# Patient Record
Sex: Male | Born: 1945
Health system: Southern US, Community
[De-identification: ages and names within clinical notes are randomized; demographics above are authoritative.]

## PROBLEM LIST (undated history)

## (undated) DIAGNOSIS — I471 Supraventricular tachycardia, unspecified: Secondary | ICD-10-CM

## (undated) DIAGNOSIS — Z201 Contact with and (suspected) exposure to tuberculosis: Secondary | ICD-10-CM

## (undated) DIAGNOSIS — L509 Urticaria, unspecified: Secondary | ICD-10-CM

## (undated) DIAGNOSIS — M1990 Unspecified osteoarthritis, unspecified site: Secondary | ICD-10-CM

## (undated) DIAGNOSIS — Z9989 Dependence on other enabling machines and devices: Secondary | ICD-10-CM

## (undated) DIAGNOSIS — I2510 Atherosclerotic heart disease of native coronary artery without angina pectoris: Secondary | ICD-10-CM

## (undated) DIAGNOSIS — J189 Pneumonia, unspecified organism: Secondary | ICD-10-CM

## (undated) DIAGNOSIS — G4733 Obstructive sleep apnea (adult) (pediatric): Secondary | ICD-10-CM

## (undated) DIAGNOSIS — G8929 Other chronic pain: Secondary | ICD-10-CM

## (undated) DIAGNOSIS — I10 Essential (primary) hypertension: Secondary | ICD-10-CM

## (undated) DIAGNOSIS — N400 Enlarged prostate without lower urinary tract symptoms: Secondary | ICD-10-CM

## (undated) DIAGNOSIS — M545 Low back pain, unspecified: Secondary | ICD-10-CM

## (undated) DIAGNOSIS — I499 Cardiac arrhythmia, unspecified: Secondary | ICD-10-CM

## (undated) DIAGNOSIS — E119 Type 2 diabetes mellitus without complications: Secondary | ICD-10-CM

## (undated) DIAGNOSIS — J449 Chronic obstructive pulmonary disease, unspecified: Secondary | ICD-10-CM

## (undated) DIAGNOSIS — E785 Hyperlipidemia, unspecified: Secondary | ICD-10-CM

## (undated) HISTORY — DX: Atherosclerotic heart disease of native coronary artery without angina pectoris: I25.10

## (undated) HISTORY — PX: TRANSURETHRAL RESECTION OF PROSTATE: SHX73

## (undated) HISTORY — DX: Unspecified osteoarthritis, unspecified site: M19.90

## (undated) HISTORY — PX: JOINT REPLACEMENT: SHX530

## (undated) HISTORY — PX: CATARACT EXTRACTION W/ INTRAOCULAR LENS  IMPLANT, BILATERAL: SHX1307

## (undated) HISTORY — DX: Chronic obstructive pulmonary disease, unspecified: J44.9

## (undated) HISTORY — DX: Supraventricular tachycardia, unspecified: I47.10

## (undated) HISTORY — DX: Hyperlipidemia, unspecified: E78.5

## (undated) HISTORY — DX: Supraventricular tachycardia: I47.1

## (undated) HISTORY — DX: Benign prostatic hyperplasia without lower urinary tract symptoms: N40.0

## (undated) HISTORY — PX: BACK SURGERY: SHX140

## (undated) HISTORY — PX: ULNAR TUNNEL RELEASE: SHX820

## (undated) HISTORY — PX: BILATERAL CARPAL TUNNEL RELEASE: SHX6508

## (undated) HISTORY — DX: Essential (primary) hypertension: I10

## (undated) HISTORY — PX: CARDIAC CATHETERIZATION: SHX172

## (undated) HISTORY — PX: POSTERIOR LUMBAR FUSION: SHX6036

## (undated) HISTORY — DX: Urticaria, unspecified: L50.9

---
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
## 1987-11-04 HISTORY — PX: NASAL SINUS SURGERY: SHX719

## 1998-05-25 ENCOUNTER — Emergency Department (HOSPITAL_COMMUNITY): Admission: EM | Admit: 1998-05-25 | Discharge: 1998-05-25 | Payer: Self-pay | Admitting: Emergency Medicine

## 1998-05-28 ENCOUNTER — Observation Stay (HOSPITAL_COMMUNITY): Admission: RE | Admit: 1998-05-28 | Discharge: 1998-05-29 | Payer: Self-pay | Admitting: Urology

## 1998-06-15 ENCOUNTER — Emergency Department (HOSPITAL_COMMUNITY): Admission: EM | Admit: 1998-06-15 | Discharge: 1998-06-15 | Payer: Self-pay | Admitting: Emergency Medicine

## 1998-06-16 ENCOUNTER — Inpatient Hospital Stay (HOSPITAL_COMMUNITY): Admission: EM | Admit: 1998-06-16 | Discharge: 1998-06-18 | Payer: Self-pay

## 1998-07-17 ENCOUNTER — Ambulatory Visit (HOSPITAL_COMMUNITY): Admission: RE | Admit: 1998-07-17 | Discharge: 1998-07-17 | Payer: Self-pay | Admitting: Neurosurgery

## 1998-07-29 ENCOUNTER — Encounter: Payer: Self-pay | Admitting: Neurosurgery

## 1998-07-29 ENCOUNTER — Ambulatory Visit (HOSPITAL_COMMUNITY): Admission: RE | Admit: 1998-07-29 | Discharge: 1998-07-29 | Payer: Self-pay | Admitting: Neurosurgery

## 1999-03-09 ENCOUNTER — Encounter: Payer: Self-pay | Admitting: Neurosurgery

## 1999-03-09 ENCOUNTER — Ambulatory Visit (HOSPITAL_COMMUNITY): Admission: RE | Admit: 1999-03-09 | Discharge: 1999-03-09 | Payer: Self-pay

## 1999-03-13 ENCOUNTER — Encounter: Payer: Self-pay | Admitting: Neurosurgery

## 1999-03-13 ENCOUNTER — Inpatient Hospital Stay (HOSPITAL_COMMUNITY): Admission: RE | Admit: 1999-03-13 | Discharge: 1999-03-14 | Payer: Self-pay | Admitting: Neurosurgery

## 2000-08-29 ENCOUNTER — Ambulatory Visit (HOSPITAL_COMMUNITY): Admission: RE | Admit: 2000-08-29 | Discharge: 2000-08-29 | Payer: Self-pay | Admitting: Neurosurgery

## 2000-08-29 ENCOUNTER — Encounter: Payer: Self-pay | Admitting: Neurosurgery

## 2001-05-23 ENCOUNTER — Ambulatory Visit (HOSPITAL_COMMUNITY): Admission: RE | Admit: 2001-05-23 | Discharge: 2001-05-23 | Payer: Self-pay | Admitting: Neurosurgery

## 2001-05-23 ENCOUNTER — Encounter: Payer: Self-pay | Admitting: Neurosurgery

## 2001-08-12 ENCOUNTER — Encounter: Admission: RE | Admit: 2001-08-12 | Discharge: 2001-08-12 | Payer: Self-pay | Admitting: Neurosurgery

## 2001-08-12 ENCOUNTER — Encounter: Payer: Self-pay | Admitting: Neurosurgery

## 2004-02-12 ENCOUNTER — Emergency Department (HOSPITAL_COMMUNITY): Admission: EM | Admit: 2004-02-12 | Discharge: 2004-02-12 | Payer: Self-pay

## 2005-05-10 ENCOUNTER — Encounter: Admission: RE | Admit: 2005-05-10 | Discharge: 2005-05-10 | Payer: Self-pay | Admitting: Family Medicine

## 2005-11-03 HISTORY — PX: TOTAL KNEE ARTHROPLASTY: SHX125

## 2005-11-19 ENCOUNTER — Ambulatory Visit: Payer: Self-pay

## 2005-11-20 ENCOUNTER — Ambulatory Visit: Payer: Self-pay

## 2005-11-24 ENCOUNTER — Inpatient Hospital Stay (HOSPITAL_COMMUNITY): Admission: RE | Admit: 2005-11-24 | Discharge: 2005-11-28 | Payer: Self-pay | Admitting: Orthopedic Surgery

## 2005-11-24 ENCOUNTER — Ambulatory Visit: Payer: Self-pay | Admitting: Physical Medicine & Rehabilitation

## 2005-12-03 ENCOUNTER — Encounter: Admission: RE | Admit: 2005-12-03 | Discharge: 2005-12-03 | Payer: Self-pay | Admitting: Orthopedic Surgery

## 2005-12-30 ENCOUNTER — Encounter: Admission: RE | Admit: 2005-12-30 | Discharge: 2005-12-30 | Payer: Self-pay | Admitting: Family Medicine

## 2006-01-07 ENCOUNTER — Encounter: Admission: RE | Admit: 2006-01-07 | Discharge: 2006-01-07 | Payer: Self-pay | Admitting: Family Medicine

## 2006-01-18 ENCOUNTER — Encounter: Admission: RE | Admit: 2006-01-18 | Discharge: 2006-01-18 | Payer: Self-pay | Admitting: Neurology

## 2006-07-16 ENCOUNTER — Ambulatory Visit (HOSPITAL_COMMUNITY): Admission: RE | Admit: 2006-07-16 | Discharge: 2006-07-16 | Payer: Self-pay

## 2006-07-16 ENCOUNTER — Encounter: Payer: Self-pay | Admitting: Family Medicine

## 2007-09-06 ENCOUNTER — Encounter: Admission: RE | Admit: 2007-09-06 | Discharge: 2007-09-06 | Payer: Self-pay | Admitting: Neurosurgery

## 2008-08-28 ENCOUNTER — Encounter: Payer: Self-pay | Admitting: Family Medicine

## 2008-12-06 ENCOUNTER — Emergency Department (HOSPITAL_COMMUNITY): Admission: EM | Admit: 2008-12-06 | Discharge: 2008-12-06 | Payer: Self-pay | Admitting: Emergency Medicine

## 2009-03-22 ENCOUNTER — Encounter: Payer: Self-pay | Admitting: Family Medicine

## 2009-03-27 ENCOUNTER — Encounter: Payer: Self-pay | Admitting: Family Medicine

## 2009-11-29 ENCOUNTER — Ambulatory Visit: Payer: Self-pay | Admitting: Family Medicine

## 2009-11-29 DIAGNOSIS — E785 Hyperlipidemia, unspecified: Secondary | ICD-10-CM

## 2009-11-29 DIAGNOSIS — I10 Essential (primary) hypertension: Secondary | ICD-10-CM | POA: Insufficient documentation

## 2009-11-29 DIAGNOSIS — E119 Type 2 diabetes mellitus without complications: Secondary | ICD-10-CM

## 2009-11-29 DIAGNOSIS — R635 Abnormal weight gain: Secondary | ICD-10-CM

## 2009-11-29 HISTORY — DX: Hyperlipidemia, unspecified: E78.5

## 2009-11-29 HISTORY — DX: Essential (primary) hypertension: I10

## 2009-11-29 LAB — CONVERTED CEMR LAB
Bilirubin Urine: NEGATIVE
Blood in Urine, dipstick: NEGATIVE
Ketones, urine, test strip: NEGATIVE
Microalb Creat Ratio: 8.9 mg/g (ref 0.0–30.0)
Nitrite: NEGATIVE
Protein, U semiquant: NEGATIVE
Specific Gravity, Urine: <1.005
Urobilinogen, UA: 0.2
WBC Urine, dipstick: NEGATIVE

## 2009-11-30 LAB — CONVERTED CEMR LAB
Alkaline Phosphatase: 82 units/L (ref 39–117)
BUN: 6 mg/dL (ref 6–23)
CO2: 31 meq/L (ref 19–32)
Chloride: 101 meq/L (ref 96–112)
GFR calc non Af Amer: 103.17 mL/min (ref >60)
Glucose, Bld: 198 mg/dL — ABNORMAL HIGH (ref 70–99)
HDL: 39.3 mg/dL (ref >39.00)
Potassium: 4.1 meq/L (ref 3.5–5.1)
TSH: 0.58 microintl units/mL (ref 0.35–5.50)
Total Bilirubin: 0.6 mg/dL (ref 0.3–1.2)
Total CHOL/HDL Ratio: 3
Total Protein: 7.3 g/dL (ref 6.0–8.3)
VLDL: 19.2 mg/dL (ref 0.0–40.0)

## 2010-02-06 ENCOUNTER — Ambulatory Visit: Payer: Self-pay | Admitting: Family Medicine

## 2010-02-06 DIAGNOSIS — L509 Urticaria, unspecified: Secondary | ICD-10-CM

## 2010-02-06 HISTORY — DX: Urticaria, unspecified: L50.9

## 2010-02-11 ENCOUNTER — Telehealth: Payer: Self-pay | Admitting: Family Medicine

## 2010-02-18 ENCOUNTER — Encounter: Admission: RE | Admit: 2010-02-18 | Discharge: 2010-02-18 | Payer: Self-pay | Admitting: Endocrinology

## 2010-06-11 ENCOUNTER — Encounter: Admission: RE | Admit: 2010-06-11 | Discharge: 2010-08-02 | Payer: Self-pay | Admitting: Endocrinology

## 2010-06-12 ENCOUNTER — Ambulatory Visit: Payer: Self-pay | Admitting: Family Medicine

## 2010-06-14 LAB — CONVERTED CEMR LAB
AST: 27 units/L (ref 0–37)
BUN: 8 mg/dL (ref 6–23)
Calcium: 9.4 mg/dL (ref 8.4–10.5)
Chloride: 102 meq/L (ref 96–112)
Cholesterol: 112 mg/dL (ref 0–200)
Creatinine, Ser: 0.7 mg/dL (ref 0.4–1.5)
Hgb A1c MFr Bld: 7.3 % — ABNORMAL HIGH (ref 4.6–6.5)
LDL Cholesterol: 69 mg/dL (ref 0–99)
Potassium: 4.4 meq/L (ref 3.5–5.1)
Total CHOL/HDL Ratio: 4
Total Protein: 6.5 g/dL (ref 6.0–8.3)

## 2010-06-17 ENCOUNTER — Telehealth: Payer: Self-pay | Admitting: Family Medicine

## 2010-07-01 ENCOUNTER — Telehealth: Payer: Self-pay | Admitting: Family Medicine

## 2010-07-11 ENCOUNTER — Encounter: Payer: Self-pay | Admitting: Family Medicine

## 2010-09-11 ENCOUNTER — Ambulatory Visit: Payer: Self-pay | Admitting: Family Medicine

## 2010-09-11 LAB — CONVERTED CEMR LAB
Albumin: 3.7 g/dL (ref 3.5–5.2)
Alkaline Phosphatase: 80 units/L (ref 39–117)
BUN: 13 mg/dL (ref 6–23)
Basophils Absolute: 0.1 10*3/uL (ref 0.0–0.1)
Basophils Relative: 0.7 % (ref 0.0–3.0)
Bilirubin Urine: NEGATIVE
Chloride: 100 meq/L (ref 96–112)
Cholesterol: 128 mg/dL (ref 0–200)
Creatinine, Ser: 0.8 mg/dL (ref 0.4–1.5)
GFR calc non Af Amer: 105.97 mL/min (ref >60)
Glucose, Bld: 293 mg/dL — ABNORMAL HIGH (ref 70–99)
HCT: 47.4 % (ref 39.0–52.0)
HDL: 32.4 mg/dL — ABNORMAL LOW (ref >39.00)
Hemoglobin, Urine: NEGATIVE
Hemoglobin: 16 g/dL (ref 13.0–17.0)
LDL Cholesterol: 72 mg/dL (ref 0–99)
Lymphocytes Relative: 22.8 % (ref 12.0–46.0)
MCHC: 33.9 g/dL (ref 30.0–36.0)
MCV: 92.2 fL (ref 78.0–100.0)
Nitrite: NEGATIVE
Platelets: 231 10*3/uL (ref 150.0–400.0)
TSH: 0.83 microintl units/mL (ref 0.35–5.50)
Total Protein: 6.3 g/dL (ref 6.0–8.3)
Urine Glucose: >=1000 mg/dL
WBC: 10.8 10*3/uL — ABNORMAL HIGH (ref 4.5–10.5)

## 2010-09-23 ENCOUNTER — Ambulatory Visit: Payer: Self-pay | Admitting: Family Medicine

## 2010-09-23 LAB — CONVERTED CEMR LAB
Cholesterol, target level: 200 mg/dL
HDL goal, serum: 40 mg/dL

## 2010-11-24 ENCOUNTER — Encounter: Payer: Self-pay | Admitting: Family Medicine

## 2010-12-03 NOTE — Assessment & Plan Note (Signed)
Summary: med check/refill/pt coming in fasting/cjr   Vital Signs:  Patient profile:   65 year old male Weight:      270 pounds Temp:     98.5 degrees F oral Pulse rate:   88 / minute BP sitting:   120 / 60  (left arm) Cuff size:   large  Vitals Entered By: Romualdo Bolk, CMA (AAMA) (June 12, 2010 8:22 AM) CC: Pt is fasting for labs and a check up on meds, Hypertension Management   History of Present Illness: Followup type 2 diabetes and hyperlipidemia. Blood sugars recently well controlled. Fasting blood sugars mostly around 120. No hypoglycemia symptoms or hypoglycemic symptoms. Needs refills of medication.  No history of CAD. Patient specifically requests lipoprotein profile today. He has read up on this considerably. He takes medications as listed and these are reviewed. He is also seeing endocrinologist though prefers that we reassess A1c today. He gets regular eye exams and had recent foot exam.  Hypertension History:      He complains of side effects from treatment, but denies headache, chest pain, palpitations, dyspnea with exertion, orthopnea, PND, peripheral edema, visual symptoms, neurologic problems, and syncope.  He notes the following problems with antihypertensive medication side effects: Byetta-Discussing this with Dr. Horald Pollen in a few weeks. Pt has almost stopped eating.        Positive major cardiovascular risk factors include male age 5 years old or older, diabetes, hyperlipidemia, hypertension, and current tobacco user.      Preventive Screening-Counseling & Management  Alcohol-Tobacco     Alcohol drinks/day: <1     Alcohol type: spirits     Smoking Status: current     Packs/Day: 1.5     Year Started: 1963  Caffeine-Diet-Exercise     Caffeine use/day: 4     Does Patient Exercise: no  Current Medications (verified): 1)  Metformin Hcl 500 Mg Tabs (Metformin Hcl) .... 2 Two Times A Day 2)  Crestor 5 Mg Tabs (Rosuvastatin Calcium) .... Once Daily 3)   Diovan 160 Mg Tabs (Valsartan) .... Once Daily 4)  Byetta 10 Mcg Pen 10 Mcg/0.55ml Soln (Exenatide) .... As Directed 5)  Aspirin 81 Mg Tabs (Aspirin) .... Once Daily 6)  Epipen 2-Pak 0.3 Mg/0.71ml Devi (Epinephrine) .... As Needed 7)  Multivitamins   Tabs (Multiple Vitamin) .Marland Kitchen.. 1 By Mouth Two Times A Day  Allergies (verified): 1)  ! * Banana 2)  ! * Shrimp 3)  Codeine Sulfate (Codeine Sulfate) 4)  Darvocet A500 5)  Altace (Ramipril)  Past History:  Past Surgical History: Last updated: 11/29/2009 Appendectomy 1963 Spinal fusion (l-4, L-5) TKR 2007  Family History: Last updated: 11/29/2009 Family History of Arthritis Prostate cancer Diabetes  Social History: Last updated: 11/29/2009 Retired Married Current Smoker Alcohol use-yes Regular exercise-no  Risk Factors: Alcohol Use: <1 (06/12/2010) Caffeine Use: 4 (06/12/2010) Exercise: no (06/12/2010)  Risk Factors: Smoking Status: current (06/12/2010) Packs/Day: 1.5 (06/12/2010)  Past Medical History: OSTEOARTHRITIS DYSLIPIDEMIA TYPE 2 DIABETES PMH-FH-SH reviewed for relevance  Social History: Caffeine use/day:  4 Packs/Day:  1.5  Review of Systems  The patient denies anorexia, fever, weight loss, weight gain, chest pain, syncope, dyspnea on exertion, peripheral edema, headaches, abdominal pain, melena, and hematochezia.    Physical Exam  General:  Well-developed,well-nourished,in no acute distress; alert,appropriate and cooperative throughout examination Eyes:  pupils equal, pupils round, and pupils reactive to light.   Ears:  External ear exam shows no significant lesions or deformities.  Otoscopic examination reveals clear  canals, tympanic membranes are intact bilaterally without bulging, retraction, inflammation or discharge. Hearing is grossly normal bilaterally. Mouth:  Oral mucosa and oropharynx without lesions or exudates.  Teeth in good repair. Neck:  No deformities, masses, or tenderness  noted. Lungs:  Normal respiratory effort, chest expands symmetrically. Lungs are clear to auscultation, no crackles or wheezes. Heart:  Normal rate and regular rhythm. S1 and S2 normal without gallop, murmur, click, rub or other extra sounds. Extremities:  no edema or clubbing. Cervical Nodes:  No lymphadenopathy noted Psych:  normally interactive, good eye contact, not anxious appearing, and not depressed appearing.    Diabetes Management Exam:    Foot Exam (with socks and/or shoes not present):       Sensory-Pinprick/Light touch:          Left medial foot (L-4): normal          Left dorsal foot (L-5): normal          Left lateral foot (S-1): normal          Right medial foot (L-4): normal          Right dorsal foot (L-5): normal          Right lateral foot (S-1): normal       Sensory-Monofilament:          Left foot: normal          Right foot: normal       Inspection:          Left foot: normal          Right foot: normal       Nails:          Left foot: normal          Right foot: normal    Eye Exam:       Eye Exam done elsewhere          Date: 05/03/2010          Results: normal          Done by: Dr. Mabeline Caras   Impression & Recommendations:  Problem # 1:  DYSLIPIDEMIA (ICD-272.4) pt requesting lipoprotein profile.  Discussed issues regarding this.  He is aware insurance may not cover. His updated medication list for this problem includes:    Crestor 5 Mg Tabs (Rosuvastatin calcium) ..... Once daily  Orders: T- * Misc. Laboratory test [phone]) TLB-Lipid Panel (80061-LIPID) TLB-Hepatic/Liver Function Pnl (80076-HEPATIC)  Problem # 2:  DM (ICD-250.00)  His updated medication list for this problem includes:    Metformin Hcl 500 Mg Tabs (Metformin hcl) .Marland Kitchen... 2 two times a day    Diovan 160 Mg Tabs (Valsartan) ..... Once daily    Byetta 10 Mcg Pen 10 Mcg/0.23ml Soln (Exenatide) .Marland Kitchen... As directed    Aspirin 81 Mg Tabs (Aspirin) ..... Once daily  Orders: TLB-A1C / Hgb  A1C (Glycohemoglobin) (83036-A1C)  Complete Medication List: 1)  Metformin Hcl 500 Mg Tabs (Metformin hcl) .... 2 two times a day 2)  Crestor 5 Mg Tabs (Rosuvastatin calcium) .... Once daily 3)  Diovan 160 Mg Tabs (Valsartan) .... Once daily 4)  Byetta 10 Mcg Pen 10 Mcg/0.26ml Soln (Exenatide) .... As directed 5)  Aspirin 81 Mg Tabs (Aspirin) .... Once daily 6)  Epipen 2-pak 0.3 Mg/0.76ml Devi (Epinephrine) .... As needed 7)  Multivitamins Tabs (Multiple vitamin) .Marland Kitchen.. 1 by mouth two times a day  Other Orders: TLB-BMP (Basic Metabolic Panel-BMET) (80048-METABOL) Prescription Created Electronically [phone])  Hypertension Assessment/Plan:  The patient's hypertensive risk group is category C: Target organ damage and/or diabetes.  His calculated 10 year risk of coronary heart disease is 22 %.  Today's blood pressure is 120/60.    Patient Instructions: 1)  Please schedule a follow-up appointment in 6 months .  2)  It is important that you exercise reguarly at least 20 minutes 5 times a week. If you develop chest pain, have severe difficulty breathing, or feel very tired, stop exercising immediately and seek medical attention.  3)  You need to lose weight. Consider a lower calorie diet and regular exercise.  4)  Check your blood sugars regularly. If your readings are usually above: 140 or below 70 you should contact our office.  5)  It is important that your diabetic A1c level is checked every 3 months.  6)  See your eye doctor yearly to check for diabetic eye damage. 7)  Check your feet each night  for sore areas, calluses or signs of infection.  Prescriptions: DIOVAN 160 MG TABS (VALSARTAN) once daily  #90 x 3   Entered and Authorized by:   Evelena Peat MD   Signed by:   Evelena Peat MD on 06/12/2010   Method used:   Print then Give to Patient   RxID:   [id number] CRESTOR 5 MG TABS (ROSUVASTATIN CALCIUM) once daily  #90 x 3   Entered and Authorized by:   Evelena Peat MD    Signed by:   Evelena Peat MD on 06/12/2010   Method used:   Print then Give to Patient   RxID:   [id number]

## 2010-12-03 NOTE — Letter (Signed)
Summary: [facility]  [facility]   Imported By: Maryln Gottron 07/30/2010 10:47:50  _____________________________________________________________________  External Attachment:    Type:   Image     Comment:   External Document

## 2010-12-03 NOTE — Progress Notes (Signed)
Summary: Symptoms no better, allergy referral request  Phone Note Call from Patient Call back at Work Phone [phone]   Caller: Patient Call For: Steve Peat MD Summary of Call: VM from pt reporting "I'm no better, I would like to be seen by the allergy office in [place],  please arrange and call me back". Phone [phone] Initial call taken by: Sid Falcon LPN,  February 11, 2010 9:20 AM  Follow-up for Phone Call        OK to refer to Dr Stout Callas (allergist) Follow-up by: Steve Peat MD,  February 11, 2010 1:04 PM  Additional Follow-up for Phone Call Additional follow up Details #1::        Pt informed of referral, pt requesting asap Additional Follow-up by: Sid Falcon LPN,  February 11, 2010 4:22 PM

## 2010-12-03 NOTE — Assessment & Plan Note (Signed)
Summary: cpx/cjr   Vital Signs:  Patient profile:   65 year old male Height:      67 inches Weight:      277 pounds BMI:     43.54 Temp:     98.1 degrees F oral Pulse rate:   80 / minute Pulse rhythm:   regular Resp:     12 per minute BP sitting:   122 / 78  (left arm) Cuff size:   large  Vitals Entered By: Sid Falcon LPN (September 23, 2010 2:46 PM)  Nutrition Counseling: Patient's BMI is greater than 25 and therefore counseled on weight management options.  History of Present Illness: Pt here for medicare wellness exam and medical problem follow up.  Has Type 2 diabetes, hypertension, and hyperlipidemia.  Here for Medicare AWV:  1.   Risk factors based on Past M, S, F history:  PMH signif for hypertension, Type 2 diabetes, dyslipidiemia.  Ongoing cigarette use.   FH signif for father with Prostate Ca.  Pt has had prior TURP 2.   Physical Activities: Walks some for exercise but no formal exercise. 3.   Depression/mood: No depressive disorder or evidence for any anxiety d/o at this time. 4.   Hearing: No signif impairments. 5.   ADL's: Fully  indepedent in all. 6.   Fall Risk: Low .  No recent falls. 7.   Home Safety: No issues identified. 8.   Height, weight, &visual acuity:  Weight has increased slightly c/w last year. 9.   Counseling: needs to lose some weight.  Immunizations need updating. 10.   Labs ordered based on risk factors: lipids, hepatic, BMP, PSA. 11.           Referral Coordination  He continues to see endocrinologist but does need appt at this time. 12.           Care Plan  Stop smoking.  Lose weight.  Exercise more.  Immunizations with Flu, PVX, and TDap. 13.            Cognitive Assessment  No impairments to short or long term memory.  Reasoning skills are solid.   Diabetes Management History:      He has not been enrolled in the "Diabetic Education Program".  He states understanding of dietary principles but he is not following the appropriate diet.  No  sensory loss is reported.  Self foot exams are being performed.  He is checking home blood sugars.  He says that he is not exercising regularly.        Hypoglycemic symptoms are not occurring.  No hyperglycemic symptoms are reported.    Hypertension History:      He denies headache, chest pain, palpitations, dyspnea with exertion, orthopnea, PND, peripheral edema, visual symptoms, neurologic problems, syncope, and side effects from treatment.        Positive major cardiovascular risk factors include male age 39 years old or older, diabetes, hyperlipidemia, hypertension, and current tobacco user.        Further assessment for target organ damage reveals no history of ASHD, stroke/TIA, or peripheral vascular disease.    Lipid Management History:      Positive NCEP/ATP III risk factors include male age 54 years old or older, diabetes, HDL cholesterol less than 40, current tobacco user, and hypertension.  Negative NCEP/ATP III risk factors include no ASHD (atherosclerotic heart disease), no prior stroke/TIA, no peripheral vascular disease, and no history of aortic aneurysm.      Clinical Review  Panels:  Prevention   Last Colonoscopy:  normal (11/03/2008)   Last PSA:  0.94 (09/11/2010)   Allergies: 1)  ! * Banana 2)  ! * Shrimp 3)  Codeine Sulfate (Codeine Sulfate) 4)  Darvocet A500 5)  Altace (Ramipril)  Past History:  Past Medical History: Last updated: 06/12/2010 OSTEOARTHRITIS DYSLIPIDEMIA TYPE 2 DIABETES  Family History: Last updated: 11/29/2009 Family History of Arthritis Prostate cancer Diabetes  Social History: Last updated: 11/29/2009 Retired Married Current Smoker Alcohol use-yes Regular exercise-no  Risk Factors: Alcohol Use: <1 (06/12/2010) Caffeine Use: 4 (06/12/2010) Exercise: no (06/12/2010)  Risk Factors: Smoking Status: current (06/12/2010) Packs/Day: 1.5 (06/12/2010)  Past Surgical History: Appendectomy 1963 Spinal fusion (l-4, L-5) TKR  2007 Hx TURP PMH-FH-SH reviewed for relevance  Review of Systems       The patient complains of weight gain.  The patient denies anorexia, fever, weight loss, vision loss, decreased hearing, hoarseness, chest pain, syncope, dyspnea on exertion, peripheral edema, prolonged cough, headaches, hemoptysis, abdominal pain, melena, hematochezia, severe indigestion/heartburn, hematuria, incontinence, muscle weakness, suspicious skin lesions, transient blindness, difficulty walking, depression, and enlarged lymph nodes.    Physical Exam  General:  Well-developed,well-nourished,in no acute distress; alert,appropriate and cooperative throughout examination Head:  Normocephalic and atraumatic without obvious abnormalities. No apparent alopecia or balding. Eyes:  pupils equal, pupils round, and pupils reactive to light.   Ears:  External ear exam shows no significant lesions or deformities.  Otoscopic examination reveals clear canals, tympanic membranes are intact bilaterally without bulging, retraction, inflammation or discharge. Hearing is grossly normal bilaterally. Mouth:  Oral mucosa and oropharynx without lesions or exudates.  Teeth in good repair. Neck:  No deformities, masses, or tenderness noted. Chest Wall:  No deformities, masses, tenderness or gynecomastia noted. Lungs:  Normal respiratory effort, chest expands symmetrically. Lungs are clear to auscultation, no crackles or wheezes. Heart:  normal rate and regular rhythm.   Abdomen:  soft, non-tender, and normal bowel sounds.   Rectal:  colonoscopy last year. Prostate:  Hx of TURP. Msk:  No deformity or scoliosis noted of thoracic or lumbar spine.   Extremities:  no pitting edema.   Neurologic:  alert & oriented X3 and cranial nerves II-XII intact.   Skin:  Intact without suspicious lesions or rashes Cervical Nodes:  No lymphadenopathy noted Psych:  normally interactive, good eye contact, not anxious appearing, and not depressed appearing.     Diabetes Management Exam:    Foot Exam (with socks and/or shoes not present):       Sensory-Pinprick/Light touch:          Left medial foot (L-4): normal          Left dorsal foot (L-5): normal          Left lateral foot (S-1): normal          Right medial foot (L-4): normal          Right dorsal foot (L-5): normal          Right lateral foot (S-1): normal       Sensory-Monofilament:          Left foot: normal          Right foot: normal       Inspection:          Left foot: normal          Right foot: normal       Nails:          Left foot: normal  Right foot: normal   Impression & Recommendations:  Problem # 1:  Preventive Health Care (ICD-V70.0) Flu, PVX, and Tdap recommended and pt consented to all.  Problem # 2:  HYPERTENSION, ESSENTIAL (ICD-401.9)  His updated medication list for this problem includes:    Diovan 160 Mg Tabs (Valsartan) ..... Once daily  Problem # 3:  DYSLIPIDEMIA (ICD-272.4)  His updated medication list for this problem includes:    Crestor 5 Mg Tabs (Rosuvastatin calcium) ..... Once daily  Problem # 4:  DM (ICD-250.00)  The following medications were removed from the medication list:    Byetta 10 Mcg Pen 10 Mcg/0.12ml Soln (Exenatide) .Marland Kitchen... As directed His updated medication list for this problem includes:    Metformin Hcl 500 Mg Tabs (Metformin hcl) .Marland Kitchen... 2 two times a day    Diovan 160 Mg Tabs (Valsartan) ..... Once daily    Aspirin 81 Mg Tabs (Aspirin) ..... Once daily  Complete Medication List: 1)  Metformin Hcl 500 Mg Tabs (Metformin hcl) .... 2 two times a day 2)  Crestor 5 Mg Tabs (Rosuvastatin calcium) .... Once daily 3)  Diovan 160 Mg Tabs (Valsartan) .... Once daily 4)  Aspirin 81 Mg Tabs (Aspirin) .... Once daily 5)  Epipen 2-pak 0.3 Mg/0.51ml Devi (Epinephrine) .... As needed 6)  Multivitamins Tabs (Multiple vitamin) .Marland Kitchen.. 1 by mouth two times a day  Other Orders: Flu Vaccine 41yrs + MEDICARE PATIENTS  (B2841) Administration Flu vaccine - MCR (G0008) Tdap => 58yrs IM (32440) Pneumococcal Vaccine (10272) Admin 1st Vaccine (53664) Admin of Any Addtl Vaccine (40347) Medicare -1st Annual Wellness Visit [phone])  Diabetes Management Assessment/Plan:      The following lipid goals have been established for the patient: Total cholesterol goal of 200; LDL cholesterol goal of 100; HDL cholesterol goal of 40; Triglyceride goal of 150.  His blood pressure goal is < 130/80.    Hypertension Assessment/Plan:      The patient's hypertensive risk group is category C: Target organ damage and/or diabetes.  His calculated 10 year risk of coronary heart disease is 27 %.  Today's blood pressure is 122/78.  His blood pressure goal is < 130/80.  Lipid Assessment/Plan:      Based on NCEP/ATP III, the patient's risk factor category is "history of diabetes".  The patient's lipid goals are as follows: Total cholesterol goal is 200; LDL cholesterol goal is 100; HDL cholesterol goal is 40; Triglyceride goal is 150.    Patient Instructions: 1)  Please schedule a follow-up appointment in 6 months .  2)  It is important that you exercise reguarly at least 20 minutes 5 times a week. If you develop chest pain, have severe difficulty breathing, or feel very tired, stop exercising immediately and seek medical attention.  3)  You need to lose weight. Consider a lower calorie diet and regular exercise.    Orders Added: 1)  Flu Vaccine 59yrs + MEDICARE PATIENTS [Q2039] 2)  Administration Flu vaccine - MCR [G0008] 3)  Tdap => 56yrs IM [90715] 4)  Pneumococcal Vaccine [90732] 5)  Admin 1st Vaccine [90471] 6)  Admin of Any Addtl Vaccine [90472] 7)  Medicare -1st Annual Wellness Visit [G0438] 8)  Est. Patient Level III [63875]   Immunizations Administered:  Tetanus Vaccine:    Vaccine Type: Tdap    Site: left deltoid    Mfr: GlaxoSmithKline    Dose: 0.5 ml    Route: IM    Given by: Sid Falcon LPN    Exp.  Date:  08/22/2012    Lot #: [id number]  Pneumonia Vaccine:    Vaccine Type: Pneumovax    Site: right deltoid    Mfr: Merck    Dose: 0.5 ml    Route: IM    Given by: Sid Falcon LPN    Exp. Date: 02/28/2012    Lot #: [id number]   Immunizations Administered:  Tetanus Vaccine:    Vaccine Type: Tdap    Site: left deltoid    Mfr: GlaxoSmithKline    Dose: 0.5 ml    Route: IM    Given by: Sid Falcon LPN    Exp. Date: 08/22/2012    Lot #: [id number]  Pneumonia Vaccine:    Vaccine Type: Pneumovax    Site: right deltoid    Mfr: Merck    Dose: 0.5 ml    Route: IM    Given by: Sid Falcon LPN    Exp. Date: 02/28/2012    Lot #: [id number]  Flu Vaccine Consent Questions     Do you have a history of severe allergic reactions to this vaccine? no    Any prior history of allergic reactions to egg and/or gelatin? no    Do you have a sensitivity to the preservative Thimersol? no    Do you have a past history of Guillan-Barre Syndrome? no    Do you currently have an acute febrile illness? no    Have you ever had a severe reaction to latex? no    Vaccine information given and explained to patient? yes    Are you currently pregnant? no    Lot Number:[id number]   Exp Date:05/03/2011   Site Given  Left Deltoid IM         .lbmedflu

## 2010-12-03 NOTE — Progress Notes (Signed)
Summary: Crestor, Diovan to [facility] request  Phone Note Call from Patient   Caller: Patient Call For: Steve Peat MD Summary of Call: Pt called requestinf Crestor and Diovan be sent to [facility] Initial call taken by: Sid Falcon LPN,  July 01, 2010 12:22 PM    Prescriptions: DIOVAN 160 MG TABS (VALSARTAN) once daily  #90 x 3   Entered by:   Sid Falcon LPN   Authorized by:   Steve Peat MD   Signed by:   Sid Falcon LPN on 16/08/9603   Method used:   Faxed to ...       [facility] (mail-order)             , [place]         Ph: [phone]       Fax: [phone]   RxID:   [id number] CRESTOR 5 MG TABS (ROSUVASTATIN CALCIUM) once daily  #90 x 3   Entered by:   Sid Falcon LPN   Authorized by:   Steve Peat MD   Signed by:   Sid Falcon LPN on 32/44/0102   Method used:   Faxed to ...       [facility] (mail-order)             , [place]         Ph: [phone]       Fax: [phone]   RxID:   [id number]

## 2010-12-03 NOTE — Assessment & Plan Note (Signed)
Summary: ?Steve Charles   Vital Signs:  Patient profile:   65 year old male Weight:      268 pounds Temp:     98.3 degrees F oral BP sitting:   98 / 68  (left arm) Cuff size:   large  Vitals Entered By: Sid Falcon LPN (February 06, 1913 2:57 PM) CC: itiching all over, hives   History of Present Illness: Acute visit for hives. Onset yesterday after eating cranberries. No prior history of cranberry allergy but he thinks this may have been the culprit. Prior history of allergy to bananas and shrimp. Denies any angioedema, shortness of breath, or wheezing. Benadryl with mild relief. Also needs EpiPen refill.  Allergies: 1)  ! * Banana 2)  ! * Shrimp 3)  Codeine Sulfate (Codeine Sulfate) 4)  Darvocet A500 5)  Altace (Ramipril)  Review of Systems  The patient denies fever, hoarseness, chest pain, syncope, dyspnea on exertion, peripheral edema, and headaches.    Physical Exam  General:  Well-developed,well-nourished,in no acute distress; alert,appropriate and cooperative throughout examination Head:  Normocephalic and atraumatic without obvious abnormalities. No apparent alopecia or balding. Mouth:  Oral mucosa and oropharynx without lesions or exudates.  Teeth in good repair. Lungs:  Normal respiratory effort, chest expands symmetrically. Lungs are clear to auscultation, no crackles or wheezes. Heart:  Normal rate and regular rhythm. S1 and S2 normal without gallop, murmur, click, rub or other extra sounds. Skin:  few minimally raised urticarial lesions on the trunk and upper extremities   Impression & Recommendations:  Problem # 1:  URTICARIA (ICD-708.9) ? food allergy to cranberries.  samples of Clarinex given. Add H2 blocker and EpiPen prescription given.  Complete Medication List: 1)  Metformin Hcl 1000 Mg Tabs (Metformin hcl) .... One tab two times a day 2)  Crestor 5 Mg Tabs (Rosuvastatin calcium) .... Once daily 3)  Diovan 160 Mg Tabs (Valsartan) .... Once daily 4)   Byetta 10 Mcg Pen 10 Mcg/0.20ml Soln (Exenatide) .... As directed 5)  Aspirin 81 Mg Tabs (Aspirin) .... Once daily 6)  Epipen 2-pak 0.3 Mg/0.61ml Devi (Epinephrine) .... As needed  Patient Instructions: 1)  Start Clarinex 5 mg one tablet daily 2)  Consider adding Zantac or Pepcid twice daily until rash clears Prescriptions: EPIPEN 2-PAK 0.3 MG/0.3ML DEVI (EPINEPHRINE) as needed  #1 x 1   Entered and Authorized by:   Evelena Peat MD   Signed by:   Evelena Peat MD on 02/06/2010   Method used:   Electronically to        [facility]* (retail)       [address]       [place], [place]  [address]       Ph: [phone] or [phone]       Fax: [phone]   RxID:   [id number]

## 2010-12-03 NOTE — Progress Notes (Signed)
Summary: Waiver of Liability Form needs to be signed for labs  Phone Note Outgoing Call Call back at [facility] Phone [phone]   Call placed by: Sid Falcon LPN,  June 17, 2010 1:25 PM Call placed to: Patient Summary of Call: Message left for pt re: special lab work pt had requested, NMR LipoProfile with lipids.  This will not be covered by Medicare and we need to have him sign a "Waiver of Liability Form".  This is the 3rd message left fot pt.  His blood drawn wil be good only through 8/17 per Gwenn in lab Initial call taken by: Sid Falcon LPN,  June 17, 2010 1:30 PM  Follow-up for Phone Call        Pt called to adv that he is out of town and won't return till 8/20... Pt adv that he will have to come back in for labdraw / signature for waiver for testing once he returns.  Follow-up by: Debbra Riding,  June 17, 2010 2:07 PM  Additional Follow-up for Phone Call Additional follow up Details #1::        Lab [facility] informed Additional Follow-up by: Sid Falcon LPN,  June 18, 2010 10:27 AM

## 2010-12-03 NOTE — Assessment & Plan Note (Signed)
Summary: TO BE EST/[place]/PT WILL COME IN FASTING/NJR   Vital Signs:  Patient profile:   65 year old male Height:      66.50 inches Weight:      273 pounds BMI:     43.56 Temp:     99.0 degrees F oral Pulse rate:   88 / minute Pulse rhythm:   regular Resp:     12 per minute BP sitting:   120 / 80  (left arm) Cuff size:   large  Vitals Entered By: Sid Falcon LPN (November 29, 2009 10:29 AM)  Nutrition Counseling: Patient's BMI is greater than 25 and therefore counseled on weight management options. CC: New to establish from [facility], discuss meds, referral   History of Present Illness: New patient to establish care.  Patient has type 2 diabetes, hypertension, and osteoarthritis. Also history of hyperlipidemia. Current medications reviewed include metformin 1000 mg b.i.d., Crestor 5 mg daily, Diovan 160 mg daily, and Byetta 10 micrograms twice daily.  Last A1c per patient about 6 months ago and 7.2%. Occasional blood sugars in the 50s range. Does not check blood sugars frequently. No recent increased thirst or urine frequency. No weight changes.  Diabetes Management History:      He has not been enrolled in the "Diabetic Education Program".  He states understanding of dietary principles and is following his diet appropriately.  No sensory loss is reported.  Self foot exams are being performed.  He is checking home blood sugars.  He says that he is not exercising regularly.        Hypoglycemic symptoms are not occurring.  No hyperglycemic symptoms are reported.        There are no symptoms to suggest diabetic complications.  Other questions/concerns include: recent weight gain.  Suboptimal A1C when checked 6 months ago.  Since his last visit, no infections have occurred.    Preventive Screening-Counseling & Management  Alcohol-Tobacco     Smoking Status: current     Year Started: 1963  Caffeine-Diet-Exercise     Does Patient Exercise: no  Allergies (verified): 1)   Codeine Sulfate (Codeine Sulfate) 2)  Darvocet A500 (Propoxyphene N-Apap) 3)  Altace (Ramipril)  Past History:  Past Medical History: ARTHRITIS dIABETES  Past Surgical History: Appendectomy 1963 Spinal fusion (l-4, L-5) TKR 2007  Family History: Family History of Arthritis Prostate cancer Diabetes  Social History: Retired Married Current Smoker Alcohol use-yes Regular exercise-no Smoking Status:  current Does Patient Exercise:  no  Review of Systems       The patient complains of weight gain.  The patient denies anorexia, fever, weight loss, vision loss, chest pain, syncope, dyspnea on exertion, peripheral edema, prolonged cough, headaches, abdominal pain, melena, hematochezia, incontinence, and muscle weakness.    Physical Exam  General:  Alert, pleasant, obese. Head:  Normocephalic and atraumatic without obvious abnormalities.  Diabetes Management Exam:    Foot Exam (with socks and/or shoes not present):       Sensory-Pinprick/Light touch:          Left medial foot (L-4): normal          Left dorsal foot (L-5): normal          Left lateral foot (S-1): normal          Right medial foot (L-4): normal          Right dorsal foot (L-5): normal          Right lateral foot (S-1): normal  Sensory-Monofilament:          Left foot: normal          Right foot: normal       Inspection:          Left foot: normal          Right foot: normal       Nails:          Left foot: normal          Right foot: normal    Eye Exam:       Eye Exam done elsewhere          Date: 12/2008          Results: normal          Done by: ophthalmologist   Impression & Recommendations:  Problem # 1:  DM (ICD-250.00) Suboptimal control.  Pt requests endocrine referral.  Not interested in staying on Byetta. His updated medication list for this problem includes:    Metformin Hcl 1000 Mg Tabs (Metformin hcl) ..... One tab two times a day    Diovan 160 Mg Tabs (Valsartan) ..... Once  daily    Byetta 10 Mcg Pen 10 Mcg/0.56ml Soln (Exenatide) .Marland Kitchen... As directed    Aspirin 81 Mg Tabs (Aspirin) ..... Once daily  Orders: Endocrinology Referral (Endocrine) TLB-A1C / Hgb A1C (Glycohemoglobin) (83036-A1C) TLB-BMP (Basic Metabolic Panel-BMET) (80048-METABOL) TLB-Microalbumin/Creat Ratio, Urine (82043-MALB)  Problem # 2:  DYSLIPIDEMIA (ICD-272.4) recheck lipids His updated medication list for this problem includes:    Crestor 5 Mg Tabs (Rosuvastatin calcium) ..... Once daily  Orders: TLB-Lipid Panel (80061-LIPID) TLB-Hepatic/Liver Function Pnl (80076-HEPATIC)  Problem # 3:  WEIGHT GAIN (ICD-783.1) check labs including TSH. Orders: TLB-TSH (Thyroid Stimulating Hormone) (84443-TSH)  Problem # 4:  HYPERTENSION, ESSENTIAL (ICD-401.9) controlled. His updated medication list for this problem includes:    Diovan 160 Mg Tabs (Valsartan) ..... Once daily  Complete Medication List: 1)  Metformin Hcl 1000 Mg Tabs (Metformin hcl) .... One tab two times a day 2)  Crestor 5 Mg Tabs (Rosuvastatin calcium) .... Once daily 3)  Diovan 160 Mg Tabs (Valsartan) .... Once daily 4)  Byetta 10 Mcg Pen 10 Mcg/0.10ml Soln (Exenatide) .... As directed 5)  Aspirin 81 Mg Tabs (Aspirin) .... Once daily  Patient Instructions: 1)  It is important that you exercise reguarly at least 20 minutes 5 times a week. If you develop chest pain, have severe difficulty breathing, or feel very tired, stop exercising immediately and seek medical attention.  2)  You need to lose weight. Consider a lower calorie diet and regular exercise.  3)  Check your blood sugars regularly. If your readings are usually above: 140 or below 70 you should contact our office.  4)  It is important that your diabetic A1c level is checked every 3 months.  5)  See your eye doctor yearly to check for diabetic eye damage. 6)  Check your feet each night  for sore areas, calluses or signs of infection.  Prescriptions: BYETTA 10 MCG  PEN 10 MCG/0.04ML SOLN (EXENATIDE) as directed  #1 x 5   Entered and Authorized by:   Evelena Peat MD   Signed by:   Evelena Peat MD on 11/29/2009   Method used:   Electronically to        [facility]* (retail)       [address]       [place], [place]  [address]  Ph: [phone] or [phone]       Fax: [phone]   RxID:   [id number] DIOVAN 160 MG TABS (VALSARTAN) once daily  #30 x 5   Entered and Authorized by:   Evelena Peat MD   Signed by:   Evelena Peat MD on 11/29/2009   Method used:   Electronically to        [facility]* (retail)       [address] [place], [place]  [address]       Ph: [phone] or [phone]       Fax: [phone]   RxID:   [id number] CRESTOR 5 MG TABS (ROSUVASTATIN CALCIUM) once daily  #30 x 5   Entered and Authorized by:   Evelena Peat MD   Signed by:   Evelena Peat MD on 11/29/2009   Method used:   Electronically to        [facility]* (retail)       [address] [place], [place]  [address]       Ph: [phone] or [phone]       Fax: [phone]   RxID:   [id number] METFORMIN HCL 1000 MG TABS (METFORMIN HCL) one tab two times a day  #60 x 5   Entered and Authorized by:   Evelena Peat MD   Signed by:   Evelena Peat MD on 11/29/2009   Method used:   Electronically to        [facility]* (retail)       [address] [place], [place]  [address]       Ph: [phone] or [phone]       Fax: [phone]   RxID:   [id number]   Preventive Care Screening  Colonoscopy:    Date:  11/03/2008    Results:  normal    Laboratory Results   Urine Tests    Routine Urinalysis   Color: yellow Appearance: Clear Glucose: trace   (Normal Range: Negative) Bilirubin: negative   (Normal Range: Negative) Ketone: negative   (Normal Range: Negative) Spec. Gravity: <1.005   (Normal Range: 1.003-1.035) Blood: negative   (Normal Range:  Negative) pH: 6.5   (Normal Range: 5.0-8.0) Protein: negative   (Normal Range: Negative) Urobilinogen: 0.2   (Normal Range: 0-1) Nitrite: negative   (Normal Range: Negative) Leukocyte Esterace: negative   (Normal Range: Negative)    Comments: Rita Ohara  November 29, 2009 1:08 PM

## 2010-12-18 ENCOUNTER — Encounter: Payer: Self-pay | Admitting: Family Medicine

## 2010-12-18 ENCOUNTER — Ambulatory Visit (INDEPENDENT_AMBULATORY_CARE_PROVIDER_SITE_OTHER): Payer: Medicare Other | Admitting: Family Medicine

## 2010-12-18 VITALS — BP 122/82 | HR 80 | Temp 97.8°F | Resp 12 | Ht 67.25 in | Wt 288.0 lb

## 2010-12-18 DIAGNOSIS — I10 Essential (primary) hypertension: Secondary | ICD-10-CM

## 2010-12-18 DIAGNOSIS — R002 Palpitations: Secondary | ICD-10-CM

## 2010-12-18 DIAGNOSIS — I499 Cardiac arrhythmia, unspecified: Secondary | ICD-10-CM

## 2010-12-18 DIAGNOSIS — E119 Type 2 diabetes mellitus without complications: Secondary | ICD-10-CM

## 2010-12-18 NOTE — Patient Instructions (Signed)
We will call you regarding cardiology appointment. Follow up promptly for any chest pain, shortness of breath, or any progressive dizziness.

## 2010-12-18 NOTE — Progress Notes (Signed)
  Subjective:    Patient ID: Steve Charles, male    DOB: 11-11-1945, 65 y.o.   MRN: [id number]  HPI  Patient is seen with sensation of irregular heartbeat past few days. Symptoms usually very transient. No associated chest pain. Sense of irregularity with mild tachycardia with reported pulse of 110 on one occasion. Denies any shortness of breath or fatigue. Patient states pulse irregular with similar episode a few years ago. No history of CAD. Does have history of peptic diabetes, dyslipidemia, and hypertension. He experienced dizziness with episode but no syncope. Recent TSH normal.  Chronic problems include obesity, hyperlipidemia, Type 2 diabetes, hypertension.  Patient quit smoking about 15 days ago.   Review of Systems  Constitutional: Negative for fever, activity change, appetite change and fatigue.  Respiratory: Negative for cough, shortness of breath and wheezing.   Cardiovascular: Positive for palpitations. Negative for chest pain and leg swelling.  Gastrointestinal: Negative for abdominal pain.  Genitourinary: Negative for dysuria.  Neurological: Negative for syncope.  Psychiatric/Behavioral: Negative for dysphoric mood.   Past Medical History  Diagnosis Date  . DM 11/29/2009  . DYSLIPIDEMIA 11/29/2009  . HYPERTENSION, ESSENTIAL 11/29/2009  . URTICARIA 02/06/2010  . WEIGHT GAIN 11/29/2009   Past Surgical History  Procedure Date  . Appendectomy 1963  . Spine surgery     spinal fusion - L4, L5  . Total knee arthroplasty 2007  . Transurethral resection of prostate     reports that he quit smoking about 2 weeks ago. His smoking use included Cigarettes. He has a 75 pack-year smoking history. He does not have any smokeless tobacco history on file. He reports that he drinks alcohol. His drug history not on file. family history is negative for Arthritis, and Cancer, and Diabetes, .        Objective:   Physical Exam  patient is alert and in no distress  neck no mass Chest  clear to auscultation Heart occasional premature beat with rate at 80. No murmur Extremities no edema.       Assessment & Plan:  #1 Palpitations.  ?transient atrial fibrillation.  Discussed options including Holter monitor vs cardiology referral and he prefers the later. EKG shows NSR with no acute findings. #2 Type 2 diabetes with hx poor control.

## 2010-12-31 DIAGNOSIS — M1990 Unspecified osteoarthritis, unspecified site: Secondary | ICD-10-CM | POA: Insufficient documentation

## 2010-12-31 DIAGNOSIS — N400 Enlarged prostate without lower urinary tract symptoms: Secondary | ICD-10-CM | POA: Insufficient documentation

## 2011-01-01 ENCOUNTER — Other Ambulatory Visit: Payer: Self-pay | Admitting: Cardiology

## 2011-01-01 ENCOUNTER — Encounter: Payer: Self-pay | Admitting: Cardiology

## 2011-01-01 ENCOUNTER — Ambulatory Visit (INDEPENDENT_AMBULATORY_CARE_PROVIDER_SITE_OTHER): Payer: Medicare Other | Admitting: Cardiology

## 2011-01-01 DIAGNOSIS — R002 Palpitations: Secondary | ICD-10-CM | POA: Insufficient documentation

## 2011-01-01 DIAGNOSIS — I10 Essential (primary) hypertension: Secondary | ICD-10-CM

## 2011-01-01 DIAGNOSIS — R0602 Shortness of breath: Secondary | ICD-10-CM

## 2011-01-01 DIAGNOSIS — R079 Chest pain, unspecified: Secondary | ICD-10-CM | POA: Insufficient documentation

## 2011-01-01 DIAGNOSIS — J439 Emphysema, unspecified: Secondary | ICD-10-CM | POA: Insufficient documentation

## 2011-01-01 LAB — BASIC METABOLIC PANEL: GFR: 112.5 mL/min (ref >60.00)

## 2011-01-01 LAB — BRAIN NATRIURETIC PEPTIDE: Pro B Natriuretic peptide (BNP): 9.1 pg/mL (ref 0.0–100.0)

## 2011-01-01 LAB — TSH: TSH: 0.82 u[IU]/mL (ref 0.35–5.50)

## 2011-01-02 ENCOUNTER — Encounter (INDEPENDENT_AMBULATORY_CARE_PROVIDER_SITE_OTHER): Payer: Medicare Other

## 2011-01-02 DIAGNOSIS — R002 Palpitations: Secondary | ICD-10-CM

## 2011-01-09 NOTE — Assessment & Plan Note (Signed)
Summary: irregular heart beat/Per Terri pt has medicare/records on fil...   Visit Type:  Initial Consult Primary Provider:  dr Docia Furl  CC:  irregular heart beat.  History of Present Illness: 65 year old male for evaluation of palpitations and dyspnea. Patient states that he has had atrial fibrillation after surgeries in the past but no other cardiac history. Over the the past one month he has noticed increasing palpitations. They are described as a flop but also occasional sustained palpitations relieved with Valsalva. He also notes increased dyspnea on exertion but no orthopnea or PND. He has had increased pedal edema. He has had chest tightness which has been continuous for approximately one month. He has not had other chest pain. Because of the above we were asked to further evaluate.  Current Medications (verified): 1)  Metformin Hcl 500 Mg Tabs (Metformin Hcl) .... 2 Two Times A Day 2)  Crestor 5 Mg Tabs (Rosuvastatin Calcium) .... Once Daily 3)  Diovan 160 Mg Tabs (Valsartan) .... Once Daily 4)  Aspirin 81 Mg Tabs (Aspirin) .... Once Daily 5)  Epipen 2-Pak 0.3 Mg/0.47ml Devi (Epinephrine) .... As Needed 6)  Multivitamins   Tabs (Multiple Vitamin) .Marland Kitchen.. 1 By Mouth Two Times A Day 7)  Humalog 100 Unit/ml Soln (Insulin Lispro (Human)) .... Sliding Scale  Allergies (verified): 1)  ! * Banana 2)  ! * Shrimp 3)  Codeine Sulfate (Codeine Sulfate) 4)  Darvocet A500 5)  Altace (Ramipril)  Past History:  Past Medical History: HYPERTENSION, ESSENTIAL  DYSLIPIDEMIA BENIGN PROSTATIC HYPERTROPHY, WITH OBSTRUCTION  OSTEOARTHRITIS H/O postoperative atrial fibrillation DM   Past Surgical History: Reviewed history from 12/31/2010 and no changes required. Appendectomy 1963 Spinal fusion (l-4, L-5) TKR 2007 Hx TURP  Family History: Reviewed history from 11/29/2009 and no changes required. Family History of Arthritis Prostate cancer Diabetes Paternal grandfather with MI at age  37  Social History: Reviewed history from 11/29/2009 and no changes required. Retired Married Tobacco abuse - yes - quit 1 month Alcohol use-yes Regular exercise-no  Review of Systems       no fevers or chills, productive cough, hemoptysis, dysphasia, odynophagia, melena, hematochezia, dysuria, hematuria, rash, seizure activity, orthopnea, PND, pedal edema, claudication. Remaining systems are negative.   Vital Signs:  Patient profile:   65 year old male Height:      67 inches Weight:      293 pounds BMI:     46.06 Pulse rate:   108 / minute Pulse rhythm:   regular BP sitting:   137 / 89  (left arm) Cuff size:   large  Vitals Entered By: Burnett Kanaris, CNA (January 01, 2011 10:30 AM)  Physical Exam  General:  Well developed/obese in NAD Skin warm/dry Patient not depressed No peripheral clubbing Back-normal HEENT-normal/normal eyelids Neck supple/normal carotid upstroke bilaterally; no bruits; no JVD; no thyromegaly chest - CTA/ normal expansion CV - RRR/normal S1 and S2; no murmurs, rubs or gallops;  PMI nondisplaced Abdomen -NT/ND, no HSM, no mass, + bowel sounds, no bruit 2+ femoral pulses, no bruits Ext-trace edema, no chords, 2+ DP; varicosities. Neuro-grossly nonfocal     EKG  Procedure date:  01/01/2011  Findings:      Sinus tachycardia at a rate of 108. Occasional PVC. No ST change.  Impression & Recommendations:  Problem # 1:  PALPITATIONS (ICD-785.1) Patient is describing palpitations that are similar to his previous episodes of atrial fibrillation. He is in sinus rhythm today with occasional PVC. I will plan a CardioNet monitor to  further evaluate. If paroxysmal atrial fibrillation is demonstrated he will require Coumadin. He would also need an AV nodal blocking agent. Schedule echocardiogram to quantify LV function. Check TSH. His updated medication list for this problem includes:    Aspirin 81 Mg Tabs (Aspirin) ..... Once daily  Orders: Event  (Event) TLB-TSH (Thyroid Stimulating Hormone) (84443-TSH)  Problem # 2:  DYSPNEA (ICD-786.05) New onset dyspnea. Echocardiogram will quantify LV function. Check renal function and BNP. Given diabetes I am also concerned about anginal equivalent. Schedule myoview.  His updated medication list for this problem includes:    Diovan 160 Mg Tabs (Valsartan) ..... Once daily    Aspirin 81 Mg Tabs (Aspirin) ..... Once daily  Orders: Nuclear Stress Test (Nuc Stress Test) Echocardiogram (Echo) TLB-BNP (B-Natriuretic Peptide) (83880-BNPR)  Problem # 3:  CHEST PAIN (ICD-786.50) His chest tightness has been continuous and doubt ischemia. Given his dyspnea will schedule Myoview as described above. His updated medication list for this problem includes:    Aspirin 81 Mg Tabs (Aspirin) ..... Once daily  Problem # 4:  HYPERTENSION, ESSENTIAL (ICD-401.9) Continue present blood pressure medications. His updated medication list for this problem includes:    Diovan 160 Mg Tabs (Valsartan) ..... Once daily    Aspirin 81 Mg Tabs (Aspirin) ..... Once daily  Orders: TLB-BMP (Basic Metabolic Panel-BMET) (80048-METABOL)  Problem # 5:  DYSLIPIDEMIA (ICD-272.4) Continue statin. His updated medication list for this problem includes:    Crestor 5 Mg Tabs (Rosuvastatin calcium) ..... Once daily  Problem # 6:  DM (ICD-250.00)  His updated medication list for this problem includes:    Metformin Hcl 500 Mg Tabs (Metformin hcl) .Marland Kitchen... 2 two times a day    Diovan 160 Mg Tabs (Valsartan) ..... Once daily    Aspirin 81 Mg Tabs (Aspirin) ..... Once daily    Humalog 100 Unit/ml Soln (Insulin lispro (human)) ..... Sliding scale  Patient Instructions: 1)  Your physician recommends that you schedule a follow-up appointment in: 4 WEEKS 2)  Your physician has requested that you have an echocardiogram.  Echocardiography is a painless test that uses sound waves to create images of your heart. It provides your doctor with  information about the size and shape of your heart and how well your heart's chambers and valves are working.  This procedure takes approximately one hour. There are no restrictions for this procedure. 3)  Your physician has requested that you have an exercise stress myoview.  For further information please visit [email or web address]  Please follow instruction sheet, as given. 4)  Your physician has recommended that you wear an event monitor.  Event monitors are medical devices that record the heart's electrical activity. Doctors most often use these monitors to diagnose arrhythmias. Arrhythmias are problems with the speed or rhythm of the heartbeat. The monitor is a small, portable device. You can wear one while you do your normal daily activities. This is usually used to diagnose what is causing palpitations/syncope (passing out).

## 2011-01-13 ENCOUNTER — Telehealth (INDEPENDENT_AMBULATORY_CARE_PROVIDER_SITE_OTHER): Payer: Self-pay | Admitting: *Deleted

## 2011-01-14 ENCOUNTER — Encounter: Payer: Self-pay | Admitting: Internal Medicine

## 2011-01-14 ENCOUNTER — Encounter: Payer: Self-pay | Admitting: Cardiology

## 2011-01-14 ENCOUNTER — Ambulatory Visit (HOSPITAL_COMMUNITY): Payer: Medicare Other | Attending: Cardiology

## 2011-01-14 DIAGNOSIS — R0989 Other specified symptoms and signs involving the circulatory and respiratory systems: Secondary | ICD-10-CM

## 2011-01-14 DIAGNOSIS — E785 Hyperlipidemia, unspecified: Secondary | ICD-10-CM | POA: Insufficient documentation

## 2011-01-14 DIAGNOSIS — E119 Type 2 diabetes mellitus without complications: Secondary | ICD-10-CM | POA: Insufficient documentation

## 2011-01-14 DIAGNOSIS — R002 Palpitations: Secondary | ICD-10-CM | POA: Insufficient documentation

## 2011-01-14 DIAGNOSIS — I4891 Unspecified atrial fibrillation: Secondary | ICD-10-CM | POA: Insufficient documentation

## 2011-01-14 DIAGNOSIS — R609 Edema, unspecified: Secondary | ICD-10-CM | POA: Insufficient documentation

## 2011-01-14 DIAGNOSIS — R0602 Shortness of breath: Secondary | ICD-10-CM | POA: Insufficient documentation

## 2011-01-14 DIAGNOSIS — R0609 Other forms of dyspnea: Secondary | ICD-10-CM | POA: Insufficient documentation

## 2011-01-14 DIAGNOSIS — R079 Chest pain, unspecified: Secondary | ICD-10-CM

## 2011-01-14 DIAGNOSIS — I4949 Other premature depolarization: Secondary | ICD-10-CM

## 2011-01-14 DIAGNOSIS — I10 Essential (primary) hypertension: Secondary | ICD-10-CM | POA: Insufficient documentation

## 2011-01-16 ENCOUNTER — Ambulatory Visit (HOSPITAL_COMMUNITY): Payer: Medicare Other | Attending: Cardiology

## 2011-01-16 DIAGNOSIS — R079 Chest pain, unspecified: Secondary | ICD-10-CM

## 2011-01-21 ENCOUNTER — Encounter: Payer: Self-pay | Admitting: Cardiology

## 2011-01-21 NOTE — Assessment & Plan Note (Addendum)
Summary: Cardiology Nuclear Testing  Nuclear Med Background Indications for Stress Test: Evaluation for Ischemia     Symptoms: Chest Tightness, DOE, Palpitations, SOB    Nuclear Pre-Procedure Cardiac Risk Factors: History of Smoking, Hypertension, IDDM Type 2, Lipids Caffeine/Decaff Intake: None NPO After: 4:00 AM Lungs: clear IV 0.9% NS with Angio Cath: 20g     IV Site: R Antecubital IV Started by: Irean Hong, RN Chest Size (in) 52     Height (in): 67 Weight (lb): 295 BMI: 46.37  Nuclear Med Study 1 or 2 day study:  1 day     Stress Test Type:  Treadmill/Lexiscan Reading MD:  Dietrich Pates, MD     Referring MD:  B.Crenshaw Resting Radionuclide:  Technetium 29m Tetrofosmin     Resting Radionuclide Dose:  33 mCi  Stress Radionuclide:  Technetium 69m Tetrofosmin     Stress Radionuclide Dose:  33 mCi   Stress Protocol  Max Systolic BP: 135 mm Hg Lexiscan: 0.4 mg   Stress Test Technologist:  Milana Na, EMT-P     Nuclear Technologist:  Doyne Keel, CNMT  Rest Procedure  Myocardial perfusion imaging was performed at rest 45 minutes following the intravenous administration of Technetium 74m Tetrofosmin.  Stress Procedure  The patient received IV Lexiscan 0.4 mg over 15-seconds with concurrent low level exercise and then Technetium 33m Tetrofosmin was injected at 30-seconds while the patient continued walking one more minute.  There were no significant changes and occ pacs/pvcs with Lexiscan.  Quantitative spect images were obtained after a 45 minute delay.  QPS Raw Data Images:  Extensive soft tissue (daphragm, bowel activity, subcutaneous fat) surround heart. Stress Images:  Defect in the inferior wall (base, mid, distal) and apex.  Otherwies normal perfusion. Rest Images:  Normalization of the inferior wall.  INcomplete normalization of apex. Transient Ischemic Dilatation:  1.26  (Normal <1.22)  Lung/Heart Ratio:  .38  (Normal <0.45)  Quantitative Gated Spect  Images QGS EDV:  111 ml QGS ESV:  47 ml QGS EF:  58 % QGS cine images:  Normal wall motion.    Overall Impression  Exercise Capacity: Lexiscan with low level exercise BP Response: Normal blood pressure response. Clinical Symptoms: Short of breath ECG Impression: No significant ST segment change suggestive of ischemia. Overall Impression: Extensive soft tissue shadowing noted.  There is a reversible, mild-moderate in intensity inferior perfusion defect.  Cannot rule out ischemia but concerned for attenuation. Overall Impression Comments: Normal LV systolic function.   Appended Document: Cardiology Nuclear Testing f/u ov  Appended Document: Cardiology Nuclear Testing pt aware of results

## 2011-01-21 NOTE — Progress Notes (Signed)
Summary: nuc pre procedure  Phone Note Outgoing Call Call back at Home Phone [phone]   Call placed by: Cathlyn Parsons RN,  January 13, 2011 2:38 PM Call placed to: Patient Reason for Call: Confirm/change Appt Summary of Call: Reviewed information on Myoview Information Sheet (see scanned document for further details).  Spoke with patient.      Nuclear Med Background Indications for Stress Test: Evaluation for Ischemia     Symptoms: Chest Tightness, DOE, Palpitations, SOB    Nuclear Pre-Procedure Cardiac Risk Factors: History of Smoking, Hypertension, IDDM Type 2, Lipids Height (in): 67

## 2011-01-28 ENCOUNTER — Telehealth: Payer: Self-pay | Admitting: *Deleted

## 2011-01-28 NOTE — Telephone Encounter (Signed)
Spoke with pt, he is aware monitor reviewed by dr Jens Som shows sinus with PVC's

## 2011-01-31 ENCOUNTER — Ambulatory Visit (INDEPENDENT_AMBULATORY_CARE_PROVIDER_SITE_OTHER): Payer: Medicare Other | Admitting: Cardiology

## 2011-01-31 ENCOUNTER — Encounter: Payer: Self-pay | Admitting: Cardiology

## 2011-01-31 ENCOUNTER — Encounter: Payer: Self-pay | Admitting: *Deleted

## 2011-01-31 DIAGNOSIS — I10 Essential (primary) hypertension: Secondary | ICD-10-CM

## 2011-01-31 DIAGNOSIS — R002 Palpitations: Secondary | ICD-10-CM

## 2011-01-31 DIAGNOSIS — E119 Type 2 diabetes mellitus without complications: Secondary | ICD-10-CM

## 2011-01-31 DIAGNOSIS — E785 Hyperlipidemia, unspecified: Secondary | ICD-10-CM

## 2011-01-31 DIAGNOSIS — R0602 Shortness of breath: Secondary | ICD-10-CM

## 2011-01-31 DIAGNOSIS — R9430 Abnormal result of cardiovascular function study, unspecified: Secondary | ICD-10-CM

## 2011-01-31 LAB — CBC WITH DIFFERENTIAL/PLATELET
Basophils Absolute: 0.1 10*3/uL (ref 0.0–0.1)
HCT: 47.1 % (ref 39.0–52.0)
Hemoglobin: 16 g/dL (ref 13.0–17.0)
Lymphs Abs: 2.6 10*3/uL (ref 0.7–4.0)
MCHC: 33.9 g/dL (ref 30.0–36.0)
Monocytes Relative: 9.6 % (ref 3.0–12.0)
Neutro Abs: 6.7 10*3/uL (ref 1.4–7.7)
Neutrophils Relative %: 61.8 % (ref 43.0–77.0)

## 2011-01-31 LAB — BASIC METABOLIC PANEL
BUN: 11 mg/dL (ref 6–23)
Calcium: 9.4 mg/dL (ref 8.4–10.5)
Chloride: 104 mEq/L (ref 96–112)
Glucose, Bld: 145 mg/dL — ABNORMAL HIGH (ref 70–99)

## 2011-01-31 LAB — PROTIME-INR: Prothrombin Time: 11.1 s (ref 10.2–12.4)

## 2011-01-31 NOTE — Assessment & Plan Note (Signed)
These appear to be secondary to PVCs. I have explained that these are benign in the setting of normal LV function. They are not particularly bothersome at this point. We will consider a beta blocker in the future if they worsen. No indication of atrial fibrillation.

## 2011-01-31 NOTE — Progress Notes (Signed)
HPI: 65 year old male I saw in Feb 2012 for evaluation of palpitations and dyspnea. Patient states that he has had atrial fibrillation after surgeries in the past but no other cardiac history. Echocardiogram in March of 2012 showed normal LV function and question mild RV dysfunction. Myoview in March 2012 showed extensive sub-diaphragmatic attenuation; EF 58%. Inferior ischemia cannot be excluded. TSH and BNP were normal. Cardionet revealed  Sinus with PVCs. Catheter since I last saw him he does have dyspnea on exertion and pedal edema. He denies orthopnea, chest pain or sustained palpitations. He has an occasional "flop".  Current Outpatient Prescriptions  Medication Sig Dispense Refill  . aspirin 81 MG tablet Take 81 mg by mouth daily.        Marland Kitchen EPINEPHrine (EPIPEN JR) 0.15 MG/0.3ML injection Inject 0.15 mg into the muscle as needed.        . insulin lispro protamine-insulin lispro (HUMALOG 75/25) (75-25) 100 UNIT/ML SUSP Inject 20 Units into the skin 2 (two) times daily with meals.        . metFORMIN (GLUCOPHAGE) 500 MG tablet Take by mouth. 2 pills twice daily       . Multiple Vitamin (MULTIVITAMIN) tablet Take 1 tablet by mouth 2 (two) times daily.        . rosuvastatin (CRESTOR) 5 MG tablet Take 5 mg by mouth daily.        . valsartan (DIOVAN) 160 MG tablet Take 160 mg by mouth daily.           Past Medical History  Diagnosis Date  . DYSLIPIDEMIA 11/29/2009  . HYPERTENSION, ESSENTIAL 11/29/2009  . URTICARIA 02/06/2010  . BPH (benign prostatic hypertrophy)   . Osteoarthritis   . DM (diabetes mellitus)     Past Surgical History  Procedure Date  . Appendectomy 1963  . Spine surgery     spinal fusion - L4, L5  . Total knee arthroplasty 2007  . Transurethral resection of prostate     History   Social History  . Marital Status: Married    Spouse Name: N/A    Number of Children: N/A  . Years of Education: N/A   Occupational History  . Not on file.   Social History Main Topics    . Smoking status: Former Smoker -- 1.5 packs/day for 50 years    Types: Cigarettes    Quit date: 12/04/2010  . Smokeless tobacco: Not on file  . Alcohol Use: Yes  . Drug Use: Not on file  . Sexually Active: Not on file   Other Topics Concern  . Not on file   Social History Narrative  . No narrative on file    ROS: no fevers or chills, productive cough, hemoptysis, dysphasia, odynophagia, melena, hematochezia, dysuria, hematuria, rash, seizure activity, orthopnea, PND, pedal edema, claudication. Remaining systems are negative.  Physical Exam: Well-developed obese in no acute distress.  Skin is warm and dry.  HEENT is normal.  Neck is supple. No thyromegaly.  Chest is clear to auscultation with normal expansion.  Cardiovascular exam is regular rate and rhythm.  Abdominal exam nontender or distended. No masses palpated. Extremities show trace to 1+ edema. neuro grossly intact

## 2011-01-31 NOTE — Patient Instructions (Signed)
Labs today  Your physician has requested that you have a cardiac catheterization. Cardiac catheterization is used to diagnose and/or treat various heart conditions. Doctors may recommend this procedure for a number of different reasons. The most common reason is to evaluate chest pain. Chest pain can be a symptom of coronary artery disease (CAD), and cardiac catheterization can show whether plaque is narrowing or blocking your heart's arteries. This procedure is also used to evaluate the valves, as well as measure the blood flow and oxygen levels in different parts of your heart. For further information please visit www.cardiosmart.org. Please follow instruction sheet, as given.  Your physician recommends that you schedule a follow-up appointment in: 6 weeks  

## 2011-01-31 NOTE — Assessment & Plan Note (Signed)
I have reviewed the patient's Myoview with him. He appears to have inferior ischemia. Given his risk factors including diabetes I feel definitive evaluation is warranted. Proceed with left and right heart catheterization. The risks and benefits have been discussed and he agrees to proceed.

## 2011-01-31 NOTE — Assessment & Plan Note (Signed)
Management Management per primary care.

## 2011-01-31 NOTE — Assessment & Plan Note (Signed)
Blood pressure controlled. Continue present medications. 

## 2011-01-31 NOTE — Assessment & Plan Note (Signed)
Management per primary care. 

## 2011-01-31 NOTE — Assessment & Plan Note (Signed)
Patient continues to have dyspnea on exertion. His LV function is normal. I wonder if there may be a component of pulmonary hypertension related to untreated sleep apnea. Plan right heart catheter and referral to pulmonary.

## 2011-02-03 ENCOUNTER — Telehealth: Payer: Self-pay | Admitting: Cardiology

## 2011-02-03 NOTE — Telephone Encounter (Signed)
Message copied by Deliah Goody on Mon Feb 03, 2011  5:06 PM ------      Message from: [name]      Created: Fri Jan 31, 2011  5:49 PM       ok

## 2011-02-03 NOTE — Telephone Encounter (Signed)
pt aware of lab results  

## 2011-02-03 NOTE — Telephone Encounter (Signed)
Pt wants to reschedule cardiac cath to tomorrow if possible

## 2011-02-04 ENCOUNTER — Ambulatory Visit (INDEPENDENT_AMBULATORY_CARE_PROVIDER_SITE_OTHER): Payer: Medicare Other | Admitting: Family Medicine

## 2011-02-04 ENCOUNTER — Encounter: Payer: Self-pay | Admitting: Family Medicine

## 2011-02-04 VITALS — BP 122/90 | Temp 98.6°F | Wt 300.0 lb

## 2011-02-04 DIAGNOSIS — R635 Abnormal weight gain: Secondary | ICD-10-CM

## 2011-02-04 DIAGNOSIS — M1990 Unspecified osteoarthritis, unspecified site: Secondary | ICD-10-CM

## 2011-02-04 DIAGNOSIS — R5383 Other fatigue: Secondary | ICD-10-CM

## 2011-02-04 DIAGNOSIS — M2550 Pain in unspecified joint: Secondary | ICD-10-CM

## 2011-02-04 DIAGNOSIS — R5381 Other malaise: Secondary | ICD-10-CM

## 2011-02-04 NOTE — Progress Notes (Signed)
  Subjective:    Patient ID: Steve Charles, male    DOB: 1946-06-23, 65 y.o.   MRN: [id number]  Headache  Associated symptoms include back pain and neck pain. Pertinent negatives include no coughing, dizziness or fever.  Neck Pain  Pertinent negatives include no fever or headaches.  Back Pain Pertinent negatives include no fever or headaches.   Patient seen with chief complain of some arthralgias with relatively acute onset last night. He relates around 1:30 AM he was rolling over in bed and felt sharp pain in his lower neck with radiation down both arms. He has known history of cervical arthritis.  Transient mild bilateral upper extremity numbness. Then, this morning noted symmetrical arthralgias in multiple joints including neck, shoulders, elbows, knees, ankles, and wrists. No similar symptoms in past.  No hand involvement. No redness or warmth. Joints somewhat stiffer than usual. Denied any fever or rash. No recent tick bite. No family history of inflammatory arthritis.  Recent history is that he's had some palpitations and was referred to cardiology. Evaluation with nuclear stress test revealed possible inferior ischemia. He had no chest pains but has had some dyspnea recently and weight gain. Cardiac catheterization scheduled for this Friday.  Recently quit smoking.  He has been poorly compliant with exercise over the winter with significant weight gain. No orthopnea. His chronic problems include obesity, type 2 diabetes, dyslipidemia, hypertension, and osteoarthritis   Review of Systems  Constitutional: Positive for fatigue. Negative for fever and chills.  HENT: Positive for neck pain.   Respiratory: Positive for shortness of breath. Negative for cough.   Musculoskeletal: Positive for back pain and arthralgias. Negative for myalgias, joint swelling and gait problem.  Skin: Negative for rash.  Neurological: Negative for dizziness and headaches.  Hematological: Negative for adenopathy.  Does not bruise/bleed easily.       Objective:   Physical Exam  Constitutional: He is oriented to person, place, and time. He appears well-developed and well-nourished.  HENT:  Head: Normocephalic and atraumatic.  Eyes: Pupils are equal, round, and reactive to light.  Cardiovascular: Normal rate and regular rhythm.   No murmur heard. Pulmonary/Chest: Effort normal and breath sounds normal. He has no wheezes. He has no rales.  Musculoskeletal: He exhibits edema.       Only trace edema lower legs bilaterally No inflammatory changes of joints such as erythema, edema, or warmth.  Neurological: He is alert and oriented to person, place, and time. He displays normal reflexes. No cranial nerve deficit.       No focal strength deficits.  No sensory impairment to touch.  Skin: No rash noted.  Psychiatric: He has a normal mood and affect.          Assessment & Plan:  #1 arthralgias with acute exacerbation. No evidence objectively for acute inflammation. Check sedimentation rate, ANA, and RA latex. Doubt polymyalgia rheumatica-with lower extremity involvement. #2  Acute cervical neck pain-?Lhermitte's syndrome.   Has known cervical spondylosis.  Nonfocal neuro exam at this time.  If symptoms persist or recur follow up with his neurosurgeon. #3 recent dyspnea and palpitations. Abnormal nuclear stress test. Cardiac catheterization scheduled #4 type 2 diabetes #5 hypertension  #6 recent weight gain #7 dyslipidemia

## 2011-02-05 LAB — RHEUMATOID FACTOR: Rhuematoid fact SerPl-aCnc: <10 IU/mL (ref <=14)

## 2011-02-05 NOTE — Progress Notes (Signed)
Quick Note:  Pt informed ______ 

## 2011-02-07 ENCOUNTER — Inpatient Hospital Stay (HOSPITAL_BASED_OUTPATIENT_CLINIC_OR_DEPARTMENT_OTHER)
Admission: RE | Admit: 2011-02-07 | Discharge: 2011-02-07 | Disposition: A | Discharge status: Home / Self Care | Payer: Medicare Other | Source: Ambulatory Visit | Attending: Cardiology | Admitting: Cardiology

## 2011-02-07 DIAGNOSIS — R0989 Other specified symptoms and signs involving the circulatory and respiratory systems: Secondary | ICD-10-CM | POA: Insufficient documentation

## 2011-02-07 DIAGNOSIS — R0602 Shortness of breath: Secondary | ICD-10-CM

## 2011-02-07 DIAGNOSIS — I2510 Atherosclerotic heart disease of native coronary artery without angina pectoris: Secondary | ICD-10-CM | POA: Insufficient documentation

## 2011-02-07 DIAGNOSIS — R0609 Other forms of dyspnea: Secondary | ICD-10-CM | POA: Insufficient documentation

## 2011-02-07 DIAGNOSIS — R9439 Abnormal result of other cardiovascular function study: Secondary | ICD-10-CM | POA: Insufficient documentation

## 2011-02-11 LAB — POCT I-STAT GLUCOSE: Operator id: 178271

## 2011-02-11 LAB — POCT I-STAT 3, VENOUS BLOOD GAS (G3P V)
Acid-Base Excess: 2 mmol/L (ref 0.0–2.0)
Bicarbonate: 29.1 mEq/L — ABNORMAL HIGH (ref 20.0–24.0)
O2 Saturation: 65 %
pO2, Ven: 37 mmHg (ref 30.0–45.0)

## 2011-02-15 ENCOUNTER — Inpatient Hospital Stay (INDEPENDENT_AMBULATORY_CARE_PROVIDER_SITE_OTHER)
Admission: RE | Admit: 2011-02-15 | Discharge: 2011-02-15 | Disposition: A | Discharge status: Home / Self Care | Payer: Medicare Other | Source: Ambulatory Visit | Attending: Family Medicine | Admitting: Family Medicine

## 2011-02-15 DIAGNOSIS — T7840XA Allergy, unspecified, initial encounter: Secondary | ICD-10-CM

## 2011-02-15 LAB — GLUCOSE, CAPILLARY: Glucose-Capillary: 158 mg/dL — ABNORMAL HIGH (ref 70–99)

## 2011-02-18 ENCOUNTER — Telehealth: Payer: Self-pay | Admitting: *Deleted

## 2011-02-18 NOTE — Telephone Encounter (Signed)
[facility] Triage Call Report Triage Record Num: [id number] Operator: Tarri Glenn Patient Name: Steve Charles Call Date & Time: 02/15/2011 8:46:22AM Patient Phone: [phone] PCP: Evelena Peat Patient Gender: Male PCP Fax : [phone] Patient DOB: July 27, 1946 Practice Name: [facility] Reason for Call: Patient calling about hives, onset 02/13/11. Patient took Benadryl with no relief. Patient then took Allegra 02/14/11 with no relief. Hives are all over body and itching. Afebrile. All emergent s/s r/o with exception to persistent or recurrent symptoms that do not respond to treatment per Allergic Reacation, Severe protocol. Advised patient to go to ED/UC for evaluation, will go to [facility] UC. Protocol(s) Used: Allergic Reaction, Severe Recommended Outcome per Protocol: See Provider within 24 hours Reason for Outcome: Persistent or recurrent symptoms that do not respond to treatment Care Advice: ~ Call provider if symptoms worsen or new symptoms develop. ~ List, or take, all current prescription(s), nonprescription or alternative medication(s) to provider for evaluation. If an allergy is identified, tell all healthcare providers of your allergy. Even if a first-time reaction caused mild symptoms, a future response to the same allergen may cause more serious symptoms. Wear medical identification to alert others in case of an emergency. ~ 04/

## 2011-02-19 ENCOUNTER — Ambulatory Visit (INDEPENDENT_AMBULATORY_CARE_PROVIDER_SITE_OTHER): Payer: Medicare Other | Admitting: Family Medicine

## 2011-02-19 ENCOUNTER — Encounter: Payer: Self-pay | Admitting: Family Medicine

## 2011-02-19 VITALS — BP 140/70 | Temp 98.7°F | Wt 249.0 lb

## 2011-02-19 DIAGNOSIS — L988 Other specified disorders of the skin and subcutaneous tissue: Secondary | ICD-10-CM

## 2011-02-19 DIAGNOSIS — T7840XA Allergy, unspecified, initial encounter: Secondary | ICD-10-CM

## 2011-02-19 DIAGNOSIS — R238 Other skin changes: Secondary | ICD-10-CM

## 2011-02-19 MED ORDER — EPINEPHRINE 0.3 MG/0.3ML IJ DEVI: 0.3000 mg | Freq: Once | INTRAMUSCULAR | Status: AC

## 2011-02-19 NOTE — Progress Notes (Signed)
  Subjective:    Patient ID: Steve Charles, male    DOB: 19-Mar-1946, 65 y.o.   MRN: [id number]  HPI Patient seen with chief complaint of rash. He has a couple different types of rash. He developed last Thursday night some pruritus diffusely and subsequent hives which covered much of his body. This occurred several minutes after eating peanuts but he has never had any history of peanut allergy previously. He denied any shortness of breath or wheezing or any evidence for lip or tongue edema. He took Benadryl and Allegra without much improvement. Saturday went to urgent care was given Solu-Medrol and oral prednisone which he is finishing. Hives are resolving.  He now has nonpainful, nonpruritic blisters left lower extremity. No history of similar rash previously. He has type 2 diabetes treated with metformin and insulin and this has been fairly well-controlled with expected small bump after starting his prednisone.  Recent cardiac catheterization revealed no critical obstructive lesions. He started exercising and has goal of eventually 30 minutes 5 times per week   Review of Systems  Constitutional: Negative for fever and chills.  Respiratory: Negative for cough and shortness of breath.   Cardiovascular: Negative for chest pain.       Objective:   Physical Exam  Constitutional: He appears well-developed and well-nourished. No distress.  Cardiovascular: Normal rate and regular rhythm.   No murmur heard. Pulmonary/Chest: Effort normal and breath sounds normal. He has no wheezes. He has no rales.  Skin:       Patient has 4 separate bullous lesions left lower leg. No surrounding erythema. Clear fluid. No pustules. These are nontender. No leg edema          Assessment & Plan:  #1 bullous rash lower leg suspect bullous disease of diabetes. No signs of secondary infection. Explain these usually resolve spontaneously without treatment in 2-4 weeks.  Consider biopsy if he has any other new  lesions in other locations to rule out other cause of bullous disease. #2 recent urticaria which are resolving. EpiPen as written. Consider further allergy testing if recurs.

## 2011-02-19 NOTE — Patient Instructions (Signed)
Follow up promptly for any signs of infection such as redness or increased swelling.

## 2011-03-05 ENCOUNTER — Ambulatory Visit (INDEPENDENT_AMBULATORY_CARE_PROVIDER_SITE_OTHER): Payer: Medicare Other | Admitting: Cardiology

## 2011-03-05 ENCOUNTER — Encounter: Payer: Self-pay | Admitting: Cardiology

## 2011-03-05 VITALS — BP 118/80 | HR 96 | Ht 68.0 in | Wt 299.0 lb

## 2011-03-05 DIAGNOSIS — R002 Palpitations: Secondary | ICD-10-CM

## 2011-03-05 DIAGNOSIS — R0602 Shortness of breath: Secondary | ICD-10-CM

## 2011-03-05 DIAGNOSIS — I10 Essential (primary) hypertension: Secondary | ICD-10-CM

## 2011-03-05 DIAGNOSIS — E785 Hyperlipidemia, unspecified: Secondary | ICD-10-CM

## 2011-03-05 DIAGNOSIS — I2510 Atherosclerotic heart disease of native coronary artery without angina pectoris: Secondary | ICD-10-CM

## 2011-03-05 NOTE — Assessment & Plan Note (Signed)
Continue statin. Lipids and liver monitored by primary care. 

## 2011-03-05 NOTE — Progress Notes (Signed)
HPI: 65 year old male I saw in Feb 2012 for evaluation of palpitations and dyspnea. Patient states that he has had atrial fibrillation after surgeries in the past but no other cardiac history. Echocardiogram in March of 2012 showed normal LV function and question mild RV dysfunction. Myoview in March 2012 showed extensive sub-diaphragmatic attenuation; EF 58%. Inferior ischemia cannot be excluded. TSH and BNP were normal. Cardionet revealed  Sinus with PVCs. Cardiac cath in April of 2012 revealed normal LV function, normal pulmonary pressures and pulmonary capillary wedge pressure and nonobstructive coronary disease with the most significant lesion being a 50% LAD. Since then, he continues to have dyspnea on exertion. There is no chest pain. There is no orthopnea, PND or syncope. His palpitations are infrequent. He does have pedal edema and was given a prescription for Lasix.   Current Outpatient Prescriptions  Medication Sig Dispense Refill  . aspirin 81 MG tablet Take 81 mg by mouth daily.        . insulin lispro protamine-insulin lispro (HUMALOG 75/25) (75-25) 100 UNIT/ML SUSP Inject 20 Units into the skin 2 (two) times daily with meals.        . metFORMIN (GLUCOPHAGE) 500 MG tablet Take by mouth. 2 pills twice daily       . Multiple Vitamin (MULTIVITAMIN) tablet Take 1 tablet by mouth 2 (two) times daily.        . rosuvastatin (CRESTOR) 5 MG tablet Take 5 mg by mouth daily.        . valsartan (DIOVAN) 160 MG tablet Take 160 mg by mouth daily.           Past Medical History  Diagnosis Date  . DYSLIPIDEMIA 11/29/2009  . HYPERTENSION, ESSENTIAL 11/29/2009  . URTICARIA 02/06/2010  . BPH (benign prostatic hypertrophy)   . Osteoarthritis   . DM (diabetes mellitus)     Past Surgical History  Procedure Date  . Appendectomy 1963  . Spine surgery     spinal fusion - L4, L5  . Total knee arthroplasty 2007  . Transurethral resection of prostate     History   Social History  . Marital Status:  Married    Spouse Name: N/A    Number of Children: N/A  . Years of Education: N/A   Occupational History  . Not on file.   Social History Main Topics  . Smoking status: Former Smoker -- 1.5 packs/day for 50 years    Types: Cigarettes    Quit date: 12/04/2010  . Smokeless tobacco: Not on file  . Alcohol Use: Yes  . Drug Use: Not on file  . Sexually Active: Not on file   Other Topics Concern  . Not on file   Social History Narrative  . No narrative on file    ROS: no fevers or chills, productive cough, hemoptysis, dysphasia, odynophagia, melena, hematochezia, dysuria, hematuria, rash, seizure activity, orthopnea, PND, pedal edema, claudication. Remaining systems are negative.  Physical Exam: Well-developed obese in no acute distress.  Skin is warm and dry.  HEENT is normal.  Neck is supple. No thyromegaly.  Chest is clear to auscultation with normal expansion.  Cardiovascular exam is regular rate and rhythm.  Abdominal exam nontender or distended. No masses palpated. Extremities show 1+ edema. neuro grossly intact

## 2011-03-05 NOTE — Assessment & Plan Note (Signed)
Blood pressure controlled. Continue present medications. 

## 2011-03-05 NOTE — Patient Instructions (Signed)
Your physician recommends that you schedule a follow-up appointment in: 12 months with Dr. Shelda Pal have been referred to Pulmonary

## 2011-03-05 NOTE — Assessment & Plan Note (Signed)
Etiology unclear; Cath reveals no obstructive CAD; PCWP normal; pulmonary pressures normal. Question contribution from obesity hypoventilation syndrome or obstructive sleep apnea. I will have pulmonary evaluate the patient.

## 2011-03-05 NOTE — Assessment & Plan Note (Signed)
Symptoms infrequent. No further evaluation for now.

## 2011-03-05 NOTE — Assessment & Plan Note (Signed)
Continue aspirin and statin. 

## 2011-03-11 ENCOUNTER — Ambulatory Visit: Payer: Medicare Other | Admitting: Cardiology

## 2011-03-14 ENCOUNTER — Encounter (HOSPITAL_BASED_OUTPATIENT_CLINIC_OR_DEPARTMENT_OTHER): Payer: Medicare Other | Attending: General Surgery

## 2011-03-14 DIAGNOSIS — E119 Type 2 diabetes mellitus without complications: Secondary | ICD-10-CM | POA: Insufficient documentation

## 2011-03-14 DIAGNOSIS — Z794 Long term (current) use of insulin: Secondary | ICD-10-CM | POA: Insufficient documentation

## 2011-03-14 DIAGNOSIS — L97809 Non-pressure chronic ulcer of other part of unspecified lower leg with unspecified severity: Secondary | ICD-10-CM | POA: Insufficient documentation

## 2011-03-18 ENCOUNTER — Encounter (INDEPENDENT_AMBULATORY_CARE_PROVIDER_SITE_OTHER): Payer: Medicare Other

## 2011-03-18 DIAGNOSIS — L97909 Non-pressure chronic ulcer of unspecified part of unspecified lower leg with unspecified severity: Secondary | ICD-10-CM

## 2011-03-21 NOTE — Discharge Summary (Signed)
Steve Charles, Steve Charles NO.:  [id number]   MEDICAL RECORD NO.:  [id number]          PATIENT TYPE:  INP   LOCATION:  5034                         FACILITY:  [facility]   PHYSICIAN:  Steve Charles, M.D. DATE OF BIRTH:  1945/12/26   DATE OF ADMISSION:  11/24/2005  DATE OF DISCHARGE:  11/28/2005                                 DISCHARGE SUMMARY   ADMISSION DIAGNOSES:  1.  Left knee end stage degenerative joint disease.  2.  Diabetes.  3.  Benign prostatic hypertrophy.   DISCHARGE DIAGNOSES:  1.  End stage degenerative joint disease status post left total knee.  2.  Diabetes.  3.  Benign prostatic hypertrophy.   HISTORY OF PRESENT ILLNESS:  The patient is a 65 year old white male with a  history of diabetes and benign prostatic hypertrophy who has end stage  degenerative joint disease of his left knee. He has failed conservative care  including anti-inflammatories and articular cortisone injections and  articular high uronic acid injections and debriding arthroscopy x2. He  understands the risks, benefits, and possible complications of left total  knee replacement and is without questions.   PROCEDURE:  On November 24, 2005, the patient underwent a left total knee  replacement by Dr. Thurston Charles and a left femoral nerve block by anesthesia. He  was admitted postoperatively for pain control, deep vein thrombosis  prophylaxis and physical therapy. Lovenox and Coumadin were started on  postoperative day zero. On postoperative day one, the patient was doing  okay. His hemoglobin was 13.3, his INR was 1.2, his hemoglobin A1C was 7.1,  CPM was 0 to 50. He was given a 500 cc bolus of normal saline then his IV  was Hep-locked. His PCA was discontinued. His dressing was changed. A  rehabilitation consult was ordered to see if he would quality for sub-acute  or for skilled nursing. His family wanted him in a skilled nursing facility  or in sub-acute and did not feel that they could  take of him at home. On  postoperative day 2, he continued to do well. His pulse was 121. Hemoglobin  12.5. Range of motion minus 15 to 50 degrees. Surgical wound was well  approximated. INR was 1.5. On postoperative day 3, the patient continued to  progress with physical therapy. Pulse was 101. His INR was 1.9. His  hemoglobin was 11.3. CBG's were between 167 and 221. Social work faxed out  his FL2's. [employer] has yet to approve him going to a skilled  nursing facility. On postoperative day 4, the patient continues to do well.  [employer] did not approve for him to go to [employer].   DISPOSITION:  He was discharged to home in stable condition with a  hemoglobin of 11.1.   ACTIVITY:  He is weight bearing as tolerated.   DIET:  He is on a diabetic diet.   DISCHARGE MEDICATIONS:  1.  MSIR 15 mg 1 tablet every 4 to 6 hours as needed for pain.  2.  Coumadin 6 mg by mouth daily.  3.  Metoprolol  50 mg 1 tablet twice a day.  4.  Avandamet 02/999 1 tablet twice a day.  5.  Lipitor 10 mg 1 tablet a day.  6.  Colace 100 mg 1 tablet twice a day.   SPECIAL INSTRUCTIONS:  He has been instructed to use CPM's 0 to 90 degrees  for 8 hours a day. Elevate his left heel on a folded pillow every morning  for 30 minutes. He will call with increased redness, increased swelling,  increased drainage, or a temperature greater than 101.   FOLLOW UP:  December 08, 2005.      Steve Charles, P.A.      Steve Charles, M.D.  Electronically Signed    KS/MEDQ  D:  01/10/2006  T:  01/12/2006  Job:  [id number]

## 2011-03-21 NOTE — Op Note (Signed)
NAMESANAY, BELMAR NO.:  [id number]   MEDICAL RECORD NO.:  [id number]          PATIENT TYPE:  INP   LOCATION:  2899                         FACILITY:  [facility]   PHYSICIAN:  Elana Alm. Thurston Charles, M.D. DATE OF BIRTH:  12-17-1945   DATE OF PROCEDURE:  11/24/2005  DATE OF DISCHARGE:                                 OPERATIVE REPORT   PREOPERATIVE DIAGNOSIS:  Left knee degenerative joint disease.   POSTOPERATIVE DIAGNOSIS:  Left knee degenerative joint disease.   OPERATION PERFORMED:  Left total knee replacement DePuy cemented total knee  system with #6 cemented femur, #5 cemented tibia with 10 mm polyethylene RP  tibial spacer and 41 mm polyethylene cemented patella.   SURGEON:  Elana Alm. Thurston Charles, M.D.   ASSISTANT:  Julien Girt, P.A.   ANESTHESIA:  General.   OPERATIVE TIME:  One hour and 50 minutes.   COMPLICATIONS:  None.   DESCRIPTION OF PROCEDURE:  Steve Charles was brought to the operating room on  November 24, 2005, placed on the operating table in supine position after a  femoral nerve block was placed in the holding room by anesthesia.  He  received Ancef 2 g IV preoperatively for prophylaxis.  After being placed  under general anesthesia, he had a Foley catheter placed under sterile  conditions atraumatically.  His left knee was examined.  Range of motion  from -8 to 125 degrees with moderate varus deformity, knee stable to  ligamentous exam with normal patellar tracking.  The left leg was then  prepped using sterile DuraPrep and draped using sterile technique.  Leg was  exsanguinated and a thigh tourniquet elevated 375 mm.  Initially through a  15 cm longitudinal incision based over the patella, initial exposure was  made. The underlying subcutaneous tissues were incised along the skin  incision.  A median arthrotomy was performed revealing an excessive amount  of normal-appearing joint fluid. The articular surfaces were inspected.  He  had grade  4 changes medially to grade 3 changes laterally and grade 3 and 4  changes in the patellofemoral joint.  Osteophytes were removed off the  femoral condyles and tibial plateau.  The medial and lateral meniscal  remnants were removed as well as the anterior cruciate ligament.  An  intramedullary drill was then drilled up the femoral canal for placement of  the distal femoral cutting jig which was placed in the appropriate amount of  rotation and a distal 12 mm cut was made.  The distal femur was then sized.  A #6 was found to be the appropriate size.  A #6 cutting jig was placed and  then these cuts were made.  The proximal tibia was then exposed.  The tibial  spines were removed with an oscillating saw.  Intramedullary drill was  drilled down the tibial canal for placement of the proximal tibial cutting  jig which was placed in the appropriate amount of rotation and a proximal 10  mm cut was made based off the medial or lower side.  After this was done,  then spacer blocks were placed in flexion and  extension.  10 mm blocks gave  excellent stability, excellent balancing and excellent correction of his  flexion and varus deformities.  After this was done then, the #5 tibial base  plate trial was placed and the keel cut was made.  The PCL box cutter was  then placed on the distal femur and these cuts were made.  At this point the  #6 femoral trial was placed.  A #5 tibial baseplate trial was placed and  with a 10 mm polyethylene RP tibial spacer, the knee was reduced, taken  through a range of motion from 0 to 125 degrees with excellent stability and  excellent correction of his flexion and varus deformities.  After this was  done, patella was sized.  A resurfacing 10 mm cut was made and three locking  holes placed with a 41 mm polyethylene patella  trial which was placed.  Patellar tracking was then evaluated and this was found to be normal.  At  this point it was felt that all the components  were of excellent size fit  and stability.  They were then removed.  The knee was then jet lavage  irrigated with 3 L of saline solution.  The proximal tibia was then exposed  and a #5 tibial baseplate was cemented back and was hammered into position  with an excellent fit with excess cement being removed from around the  edges.  The #6 femoral component with cement backing was hammered into  position also with excellent fit with excess cement being removed from  around the edges.  The 10 mm polyethylene RP tibial spacer was placed on the  tibial baseplate.  The knee reduced, taken through a range of motion from 0  to 125 degrees with excellent stability and excellent correction of his  flexion and varus deformities.  The 41 mm polyethylene cement backed patella  was then placed in its position and held there with a clamp. After the  cement hardened, patellofemoral tracking was evaluated and this was found to  be normal.  At this point it was found that all of the components were of  excellent size, fit and stability.  The knee was further irrigated.  The  tourniquet was released.  Hemostasis was obtained with cautery.  The  arthrotomy was then closed with #1 Ethibond suture over two medium Hemovac  drains.  The Hemovac injected with 0.25% Marcaine with epinephrine and 4 mm  of morphine. Sterile dressings and long leg splint applied.  The patient  then awakened, extubated and taken to recovery room in stable condition.  Sponge and needle counts were correct x 2 at the end of the case.      Steve Charles, M.D.  Electronically Signed     RAW/MEDQ  D:  11/24/2005  T:  11/24/2005  Job:  [id number]

## 2011-03-24 ENCOUNTER — Ambulatory Visit: Payer: Self-pay | Admitting: Family Medicine

## 2011-03-24 ENCOUNTER — Ambulatory Visit (INDEPENDENT_AMBULATORY_CARE_PROVIDER_SITE_OTHER)
Admission: RE | Admit: 2011-03-24 | Discharge: 2011-03-24 | Disposition: A | Discharge status: Home / Self Care | Payer: Medicare Other | Source: Ambulatory Visit | Attending: Pulmonary Disease | Admitting: Pulmonary Disease

## 2011-03-24 ENCOUNTER — Encounter: Payer: Self-pay | Admitting: Pulmonary Disease

## 2011-03-24 ENCOUNTER — Ambulatory Visit (INDEPENDENT_AMBULATORY_CARE_PROVIDER_SITE_OTHER): Payer: Medicare Other | Admitting: Pulmonary Disease

## 2011-03-24 DIAGNOSIS — R0602 Shortness of breath: Secondary | ICD-10-CM

## 2011-03-24 DIAGNOSIS — G4733 Obstructive sleep apnea (adult) (pediatric): Secondary | ICD-10-CM

## 2011-03-24 NOTE — Progress Notes (Deleted)
Subjective:     Patient ID: Steve Charles, male   DOB: February 01, 1946, 65 y.o.   MRN: [id number]  HPI   Review of Systems  Constitutional: Positive for unexpected weight change.  HENT: Positive for congestion.   Respiratory: Positive for shortness of breath.   Cardiovascular: Positive for leg swelling.       Objective:   Physical Exam     Assessment:     ***    Plan:     ***

## 2011-03-24 NOTE — Progress Notes (Signed)
Subjective:    Patient ID: Steve Charles, male    DOB: 1945/12/28, 65 y.o.   MRN: [id number]  HPI 65 yo male ex-smoker with dyspnea, and sleep disruption.  He has noticed progressive dyspnea with exertion over the past several months.  He has gained about 50 lbs recently, and feels this is contributing.  He can walk about 50 feet, and then feel like his breathing gets shallow.  He does not get winded with all activities.    He quit smoking in Feb. 2012.  He uses to have cough and wheeze, but these improved after he stopped smoking.  He has never been told he has asthma or COPD before.  He denies fever, or hemoptysis.  He had a sleep study several years ago.  He was told he has sleep apnea.  He was given a CPAP machine, but couldn't tolerate this then.  He continues to have snoring, sleep disruption, and daytimes sleepiness.  Past Medical History  Diagnosis Date  . DYSLIPIDEMIA 11/29/2009  . HYPERTENSION, ESSENTIAL 11/29/2009  . URTICARIA 02/06/2010  . BPH (benign prostatic hypertrophy)   . Osteoarthritis   . DM (diabetes mellitus)      Family History  Problem Relation Age of Onset  . Arthritis Other   . Prostate cancer Brother   . Diabetes Other   . Cancer Father      History   Social History  . Marital Status: Married    Spouse Name: N/A    Number of Children: 3  . Years of Education: N/A   Occupational History  . Retired Other   Social History Main Topics  . Smoking status: Former Smoker -- 1.5 packs/day for 50 years    Types: Cigarettes    Quit date: 12/04/2010  . Smokeless tobacco: Never Used  . Alcohol Use: Yes     occasionallly  . Drug Use: No  . Sexually Active: Not on file   Other Topics Concern  . Not on file   Social History Narrative  . No narrative on file     Allergies  Allergen Reactions  . Codeine Sulfate     REACTION: hives  . Morphine And Related     Oral morphine  . Propoxyphene N-Acetaminophen     REACTION: hives  . Ramipril       Outpatient Prescriptions Prior to Visit  Medication Sig Dispense Refill  . aspirin 81 MG tablet Take 81 mg by mouth daily.        . insulin lispro protamine-insulin lispro (HUMALOG 75/25) (75-25) 100 UNIT/ML SUSP Take as directed      . metFORMIN (GLUCOPHAGE) 500 MG tablet Take by mouth. 2 pills twice daily       . Multiple Vitamin (MULTIVITAMIN) tablet Take 1 tablet by mouth 2 (two) times daily.        . rosuvastatin (CRESTOR) 5 MG tablet Take 5 mg by mouth daily.        . valsartan (DIOVAN) 160 MG tablet Take 160 mg by mouth daily.          Review of Systems Review of Systems  Constitutional: Positive for unexpected weight change.  HENT: Positive for congestion.   Respiratory: Positive for shortness of breath.   Cardiovascular: Positive for leg swelling.      Objective:   Physical Exam Filed Vitals:   03/24/11 1527  BP: 106/72  Pulse: 86  Temp: 98.3 F (36.8 C)  TempSrc: Oral  Height: 5\' 8"  (1.727 m)  Weight: 299 lb 9.6 oz (135.898 kg)  SpO2: 96%   General - Obese HEENT - Wears glasses, PERRLA, EOMI, no sinus tenderness, MP 3, no oral exudate, no LAN, no thyromegaly Cardiac - s1s2, no murmur, pulses symmetric Chest - diminished breath sounds, no wheezing or rales Abd - obese, soft, non-tender Ext - no e/c/c Neuro - normal strength, CN intact, A&O x 3 Psych - normal mood/behavior Skin - no rashes     Assessment & Plan:   DYSPNEA Likely multifactorial.  He has a history of smoking.  Will arrange for pulmonary function testing to further assess for obstructive lung disease.  Will also arrange for chest xray today.  He has a history of hypertension.  This is being managed by primary care and cardiology.  He is obese, has gained weight, and is relatively sedentary.  He likely has a component of deconditioning contributing to his dyspnea.  Encouraged him to begin weight loss efforts.  OSA (obstructive sleep apnea) He has a prior history of sleep apnea.  He has  continued sleep disruption, daytime sleepiness, and snoring.  He has a history of hypertension and diabetes.  I explained how sleep apnea can affect the patient's health.  Driving precautions and importance of weight loss were discussed.  Treatment options for sleep apnea were reviewed.  To further assess will arrange for sleep test.    Updated Medication List Outpatient Encounter Prescriptions as of 03/24/2011  Medication Sig Dispense Refill  . aspirin 81 MG tablet Take 81 mg by mouth daily.        Marland Kitchen EPIPEN 2-PAK 0.3 MG/0.3ML DEVI Use as directed      . furosemide (LASIX) 40 MG tablet 2 tablets twice daily      . insulin lispro protamine-insulin lispro (HUMALOG 75/25) (75-25) 100 UNIT/ML SUSP Take as directed      . metFORMIN (GLUCOPHAGE) 500 MG tablet Take by mouth. 2 pills twice daily       . Multiple Vitamin (MULTIVITAMIN) tablet Take 1 tablet by mouth 2 (two) times daily.        . rosuvastatin (CRESTOR) 5 MG tablet Take 5 mg by mouth daily.        . valsartan (DIOVAN) 160 MG tablet Take 160 mg by mouth daily.

## 2011-03-24 NOTE — Patient Instructions (Signed)
Chest xray today Will schedule breathing test (PFT) Will schedule sleep test  Follow up in 3 to 4 weeks

## 2011-03-24 NOTE — Assessment & Plan Note (Addendum)
Likely multifactorial.  He has a history of smoking.  Will arrange for pulmonary function testing to further assess for obstructive lung disease.  Will also arrange for chest xray today.  He has a history of hypertension.  This is being managed by primary care and cardiology.  He is obese, has gained weight, and is relatively sedentary.  He likely has a component of deconditioning contributing to his dyspnea.  Encouraged him to begin weight loss efforts.

## 2011-03-26 ENCOUNTER — Ambulatory Visit (HOSPITAL_BASED_OUTPATIENT_CLINIC_OR_DEPARTMENT_OTHER): Payer: Medicare Other | Attending: Pulmonary Disease

## 2011-03-26 DIAGNOSIS — E119 Type 2 diabetes mellitus without complications: Secondary | ICD-10-CM | POA: Insufficient documentation

## 2011-03-26 DIAGNOSIS — G4730 Sleep apnea, unspecified: Secondary | ICD-10-CM | POA: Insufficient documentation

## 2011-03-26 DIAGNOSIS — Z7982 Long term (current) use of aspirin: Secondary | ICD-10-CM | POA: Insufficient documentation

## 2011-03-26 DIAGNOSIS — G4733 Obstructive sleep apnea (adult) (pediatric): Secondary | ICD-10-CM

## 2011-03-26 DIAGNOSIS — Z79899 Other long term (current) drug therapy: Secondary | ICD-10-CM | POA: Insufficient documentation

## 2011-03-26 DIAGNOSIS — I10 Essential (primary) hypertension: Secondary | ICD-10-CM | POA: Insufficient documentation

## 2011-03-26 DIAGNOSIS — R0609 Other forms of dyspnea: Secondary | ICD-10-CM | POA: Insufficient documentation

## 2011-03-26 DIAGNOSIS — R0989 Other specified symptoms and signs involving the circulatory and respiratory systems: Secondary | ICD-10-CM | POA: Insufficient documentation

## 2011-03-26 HISTORY — DX: Obstructive sleep apnea (adult) (pediatric): G47.33

## 2011-03-27 ENCOUNTER — Ambulatory Visit (INDEPENDENT_AMBULATORY_CARE_PROVIDER_SITE_OTHER): Payer: Medicare Other | Admitting: Family Medicine

## 2011-03-27 ENCOUNTER — Encounter: Payer: Self-pay | Admitting: Family Medicine

## 2011-03-27 VITALS — BP 102/62 | HR 118 | Temp 98.3°F | Resp 20 | Wt 299.0 lb

## 2011-03-27 DIAGNOSIS — R609 Edema, unspecified: Secondary | ICD-10-CM

## 2011-03-27 DIAGNOSIS — E785 Hyperlipidemia, unspecified: Secondary | ICD-10-CM

## 2011-03-27 DIAGNOSIS — I10 Essential (primary) hypertension: Secondary | ICD-10-CM

## 2011-03-27 DIAGNOSIS — E119 Type 2 diabetes mellitus without complications: Secondary | ICD-10-CM

## 2011-03-27 DIAGNOSIS — G4733 Obstructive sleep apnea (adult) (pediatric): Secondary | ICD-10-CM

## 2011-03-27 NOTE — Progress Notes (Signed)
  Subjective:    Patient ID: Steve Charles, male    DOB: 03/12/1946, 65 y.o.   MRN: [id number]  HPI Patient has history of obesity, type 2 diabetes, dyslipidemia, hypertension, BPH, osteoarthritis, CAD, and obstructive sleep apnea. Recent evaluation for dyspnea. Pulmonary function test pending. Recent sleep study just last night. Has followup with pulmonologist. Type 2 diabetes followed by endocrinologist. Blood sugars relatively stable but increased weight gain. Patient prefers to discontinue insulin and will discuss with endocrinologist  Recent bullous lesions lower legs. Suspected diabetic bullae. These have essentially resolved. Recent peripheral edema treated with furosemide currently 80 mg daily. No potassium supplementation. Occasional leg cramps. No electrolytes since initiation 3 weeks ago. No orthostasis.  Hyperlipidemia.  On Crestor.  No myalgias and compliant with therapy.   Review of Systems  Constitutional: Negative for fatigue.  Respiratory: Positive for shortness of breath. Negative for cough and wheezing.   Cardiovascular: Negative for chest pain and palpitations.  Gastrointestinal: Negative for abdominal pain.  Genitourinary: Negative for dysuria.  Neurological: Negative for dizziness.  Psychiatric/Behavioral: Negative for dysphoric mood.       Objective:   Physical Exam  Constitutional: He is oriented to person, place, and time. He appears well-developed and well-nourished.  HENT:  Head: Normocephalic.  Right Ear: External ear normal.  Left Ear: External ear normal.  Neck: Neck supple. No thyromegaly present.  Cardiovascular: Normal rate, regular rhythm and normal heart sounds.  Exam reveals no gallop.   Pulmonary/Chest: Effort normal and breath sounds normal. No respiratory distress. He has no wheezes. He has no rales.  Musculoskeletal:       Trace nonpitting edema lower legs bilaterally. Diabetic bullous lesions previously noted have fully healed    Lymphadenopathy:    He has no cervical adenopathy.  Neurological: He is alert and oriented to person, place, and time.          Assessment & Plan:  #1 peripheral edema. Improved. Check basic metabolic panel with recent initiation of furosemide. #2 diabetic bullae. Resolved. Again emphasize these usually self-limited. #3 type 2 diabetes. Discuss insulin issues with endocrinologist #4 dyspnea. History of sleep apnea and questionable obesity hypoventilation. Continue followup with pulmonologist. Establish more regular exercise.  He is aware he needs to lose weight and plans to start exercise program soon. #5 hyperlipidemia.  On Crestor.  Check lipids and hepatic.

## 2011-04-02 ENCOUNTER — Telehealth: Payer: Self-pay | Admitting: Pulmonary Disease

## 2011-04-02 NOTE — Telephone Encounter (Signed)
I informed pt of VS findings and recommendations. Pt verbalized understanding

## 2011-04-02 NOTE — Telephone Encounter (Signed)
Chest xray reviewed.  Shows mild bronchitic changes.  Will have my nurse call to inform patient that chest xray was unremarkable, and will discuss further at next visit.

## 2011-04-03 LAB — HEPATIC FUNCTION PANEL
ALT: 34 U/L (ref 0–53)
Albumin: 3.6 g/dL (ref 3.5–5.2)
Alkaline Phosphatase: 84 U/L (ref 39–117)
Bilirubin, Direct: 0.1 mg/dL (ref 0.0–0.3)
Total Protein: 6.4 g/dL (ref 6.0–8.3)

## 2011-04-03 LAB — BASIC METABOLIC PANEL
BUN: 17 mg/dL (ref 6–23)
Calcium: 9.1 mg/dL (ref 8.4–10.5)
GFR: 121.87 mL/min (ref >60.00)
Potassium: 4.1 mEq/L (ref 3.5–5.1)
Sodium: 138 mEq/L (ref 135–145)

## 2011-04-03 LAB — LIPID PANEL
Cholesterol: 126 mg/dL (ref 0–200)
HDL: 40 mg/dL (ref >39.00)
VLDL: 42.6 mg/dL — ABNORMAL HIGH (ref 0.0–40.0)

## 2011-04-03 NOTE — Procedures (Unsigned)
DUPLEX DEEP VENOUS EXAM - LOWER EXTREMITY  INDICATION:  Lower extremity ulcer.  HISTORY:  Edema:  No. Trauma/Surgery:  No. Pain:  No. PE:  No. Previous DVT:  No. Anticoagulants: Other:  Left distal lower leg open sore for 2 weeks.  DUPLEX EXAM:               CFV   SFV   PopV  PTV    GSV               R  L  R  L  R  L  R   L  R  L Thrombosis    o  o  o  o  o  o  o   o  o  o Spontaneous   +  +  +  +  +  +  +   +  +  + Phasic        +  +  +  +  +  +  +   +  +  + Augmentation  +  +  +  +  +  +  +   +  +  + Compressible  +  +  +  +  +  +  +   +  +  + Competent     o  o  +  +  +  +  +   +  +  +  Legend:  + - yes  o - no  p - partial  D - decreased  IMPRESSION: 1. No evidence of deep or superficial venous thrombosis noted in the     bilateral lower extremities. 2. Clinically significant reflux of >500 milliseconds noted in the     bilateral common femoral veins.  Preliminary findings stating patency and compressibility of the bilateral lower extremity venous systems were faxed by the sonographer to Dr. Sheppard Evens office on 03/18/2011.   _____________________________ Janetta Hora. Fields, MD  CH/MEDQ  D:  03/18/2011  T:  03/18/2011  Job:  [id number]

## 2011-04-04 NOTE — Progress Notes (Signed)
Quick Note:  Pt informed on VM, personally identified ______

## 2011-04-08 ENCOUNTER — Encounter: Payer: Self-pay | Admitting: Pulmonary Disease

## 2011-04-08 NOTE — Assessment & Plan Note (Signed)
He has a prior history of sleep apnea.  He has continued sleep disruption, daytime sleepiness, and snoring.  He has a history of hypertension and diabetes.  I explained how sleep apnea can affect the patient's health.  Driving precautions and importance of weight loss were discussed.  Treatment options for sleep apnea were reviewed.  To further assess will arrange for sleep test.

## 2011-04-09 DIAGNOSIS — R0989 Other specified symptoms and signs involving the circulatory and respiratory systems: Secondary | ICD-10-CM

## 2011-04-09 DIAGNOSIS — I10 Essential (primary) hypertension: Secondary | ICD-10-CM

## 2011-04-09 DIAGNOSIS — E119 Type 2 diabetes mellitus without complications: Secondary | ICD-10-CM

## 2011-04-09 DIAGNOSIS — R0609 Other forms of dyspnea: Secondary | ICD-10-CM

## 2011-04-09 DIAGNOSIS — G4730 Sleep apnea, unspecified: Secondary | ICD-10-CM

## 2011-04-09 NOTE — Procedures (Addendum)
Steve Charles, Steve Charles NO.:  [id number]  MEDICAL RECORD NO.:  [id number]          PATIENT TYPE:  OUT  LOCATION:  SLEEP CENTER                 FACILITY:  [facility]  PHYSICIAN:  Coralyn Helling, MD        DATE OF BIRTH:  18-Dec-1945  DATE OF STUDY:  03/26/2011                           NOCTURNAL POLYSOMNOGRAM  REFERRING PHYSICIAN:  Diane Hanel  INDICATION:  Mr. Faria is a 65 year old male who has a history of hypertension and diabetes.  He also has sleep disruption, snoring, and daytime sleepiness.  He is referred to the sleep lab for evaluation of hypersomnia with obstructive apnea.  Height is 5 feet 8 inches, weight is 296 pounds.  BMI is 45.  Neck size 18 inches.  MEDICATIONS:  Metformin, Crestor, Diovan, furosemide, aspirin, and Humalog.  EPWORTH SCORE:  8.  SLEEP ARCHITECTURE:  The patient followed a split night study protocol. Total recording time was 157 minutes.  Total sleep time is 128 minutes. Sleep efficiency is 81%.  Sleep latency was 7 minutes.  REM latency is 46 minutes.  This portion of the study was notable for lack of stage III sleep.  The patient slept in both supine and nonsupine positions.  During the titration portion of the study, total recording time was 246 minutes.  Total sleep time was 223 minutes.  Sleep efficiency was 91%. Sleep latency was 1 minute.  REM latency was 52 minutes.  This portion of the study was notable for lack of stage III sleep and the patient slept both supine and nonsupine positions.  RESPIRATORY RATE DATA:  The average respiratory rate was 18.  Moderate snoring was noted by the technician.  During the diagnostic portion of the study, the overall apnea-hypopnea index is 34.5.  The events were exclusively obstructive in nature.  The REM apnea-hypopnea index was 41.7.  The non-REM apnea-hypopnea index was 33.  The supine apnea copy index was 35.3.  The non supine apnea copy index was 32.  During the titration portion  of study, the patient was started on CPAP at 6 and increased to 13 cm of water.  With CPAP set at 9 cm of water the apnea-hypopnea index was reduced to eight.  After this pressure setting, he was observed in REM sleep and supine sleep.  OXYGEN DATA:  The baseline oxygenation was 93%.  The oxygen saturation nadir was 77%.  The study was conducted without the use of supplemental oxygen.  CARDIAC DATA:  The average heart rate was 90, and the rhythm strip showed occasional PACs and PVCs.  MOVEMENT PARASOMNIA:  The periodic limb movement index was 13, and the patient had one restroom trip.  IMPRESSION:  This study shows evidence for severe obstructive sleep apnea with an apnea-hypopnea index of 34.5.  He had good control of his sleep apnea with CPAP set at 9 cm of water. After this pressure setting, he was observed in REM sleep and supine sleep.  In addition to diet, excise, weight reduction, I would recommend the patient be started on CPAP at 9 cm water and monitored for his clinical response.     Coralyn Helling, MD Diplomat, American Board of Sleep  Medicine Electronically Signed    VS/MEDQ  D:  04/08/2011 14:14:04  T:  04/09/2011 04:19:09  Job:  [id number]

## 2011-04-10 ENCOUNTER — Telehealth: Payer: Self-pay | Admitting: Pulmonary Disease

## 2011-04-10 DIAGNOSIS — G4733 Obstructive sleep apnea (adult) (pediatric): Secondary | ICD-10-CM

## 2011-04-10 NOTE — Telephone Encounter (Signed)
I informed pt of VS's findings and recommendations. Pt verbalized understanding and wants to wait for follow up to discuss options.

## 2011-04-10 NOTE — Telephone Encounter (Signed)
PSG 03/26/11 shows severe sleep apnea.  Had good response to CPAP 9 cm H2O.  Attempted to call pt to inform of results, but no answer.  Left message for him to call back to discuss results.    Will forward message to triage to expect call back from pt.  He can either proceed with CPAP set up now, or wait until ROV later this month to discuss options further.

## 2011-04-11 ENCOUNTER — Encounter (HOSPITAL_BASED_OUTPATIENT_CLINIC_OR_DEPARTMENT_OTHER): Payer: Medicare Other

## 2011-04-23 NOTE — Cardiovascular Report (Signed)
  NAMELEQUAN, DOBRATZ NO.:  [id number]  MEDICAL RECORD NO.:  [id number]          PATIENT TYPE:  LOCATION:                                 FACILITY:  PHYSICIAN:  Rollene Rotunda, MD, FACCDATE OF BIRTH:  09-Nov-1945  DATE OF PROCEDURE:  02/07/2011 DATE OF DISCHARGE:                           CARDIAC CATHETERIZATION   CARDIOLOGIST:  Madolyn Frieze. Jens Som, MD, [facility]  PRIMARY CARE PROVIDER:  Evelena Peat, MD  REASON FOR PRESENTATION:  Evaluate the patient with dyspnea and a perfusion study suggesting inferior ischemia.  PROCEDURE NOTE:  Left and right heart catheterization performed via right femoral artery and vein respectively.  Both vessels were cannulated using anterior wall puncture.  A #4-French arterial sheath and a #7-French venous sheath were inserted via the modified Seldinger technique.  Preformed Judkins and a pigtail catheter were utilized.  The patient tolerated the procedure well and left lab in stable condition. Of note, he had a very tortuous thoracic aorta which made torquing the catheters difficult.  RESULTS:  Hemodynamics:  RA mean 8, RV 36/9, pulmonary capillary wedge pressure mean 13, PA 21, LV 113/20, AO 101/65, cardiac output/cardiac index 4.2/1.7.  Coronaries:  Left main was normal.  The LAD had diffuse luminal irregularities.  There was proximal 25% stenosis.  There was moderate calcification.  There was mid 50% stenosis and distal 30% stenosis.  The vessel wrapped the apex.  First diagonal was moderate size and normal.  Second and third diagonals were small and normal.  The ramus intermediate was a large vessel with diffuse luminal irregularities.  Circumflex in the AV groove was normal.  Obtuse marginal was moderate size and normal.  The right coronary artery was dominant.  There were diffuse luminal irregularities.  The PDA was moderate sized with luminal irregularities.  Left ventriculogram:  The left ventriculogram was obtained in  the RAO projection.  The EF was 60%. Our injector was not working, so this was a hand injection and difficult to visualize.  There were no obvious wall motion abnormalities, however.  CONCLUSION:  Nonobstructive coronary artery disease.  Normal pulmonary artery pressures.  PLAN:  The patient will have further cardiovascular testing or workup for dyspnea per Dr. Jens Som.  There is no ischemic etiology identified. There is no evidence of pulmonary hypertension.     Rollene Rotunda, MD, [facility]     JH/MEDQ  D:  02/07/2011  T:  02/08/2011  Job:  [id number]  cc:   Evelena Peat, M.D. Madolyn Frieze Jens Som, MD, [facility]  Electronically Signed by Rollene Rotunda MD [facility] on 04/23/2011 11:06:58 AM

## 2011-04-30 ENCOUNTER — Encounter: Payer: Self-pay | Admitting: Pulmonary Disease

## 2011-05-01 ENCOUNTER — Ambulatory Visit (INDEPENDENT_AMBULATORY_CARE_PROVIDER_SITE_OTHER): Payer: Medicare Other | Admitting: Pulmonary Disease

## 2011-05-01 ENCOUNTER — Encounter: Payer: Self-pay | Admitting: Pulmonary Disease

## 2011-05-01 VITALS — BP 90/60 | HR 94 | Temp 98.4°F | Ht 68.0 in | Wt 299.0 lb

## 2011-05-01 DIAGNOSIS — J449 Chronic obstructive pulmonary disease, unspecified: Secondary | ICD-10-CM

## 2011-05-01 DIAGNOSIS — G4733 Obstructive sleep apnea (adult) (pediatric): Secondary | ICD-10-CM

## 2011-05-01 DIAGNOSIS — R0602 Shortness of breath: Secondary | ICD-10-CM

## 2011-05-01 DIAGNOSIS — J438 Other emphysema: Secondary | ICD-10-CM

## 2011-05-01 DIAGNOSIS — J439 Emphysema, unspecified: Secondary | ICD-10-CM

## 2011-05-01 HISTORY — DX: Chronic obstructive pulmonary disease, unspecified: J44.9

## 2011-05-01 LAB — PULMONARY FUNCTION TEST

## 2011-05-01 MED ORDER — TIOTROPIUM BROMIDE MONOHYDRATE 18 MCG IN CAPS: 18.0000 ug | ORAL_CAPSULE | Freq: Every day | RESPIRATORY_TRACT | Status: DC

## 2011-05-01 NOTE — Patient Instructions (Signed)
Will arrange for CPAP set up Spiriva one puff daily Follow up in 2 months

## 2011-05-01 NOTE — Assessment & Plan Note (Signed)
He has severe sleep apnea.  I have reviewed his sleep test results with the patient.  Explained how sleep apnea can affect the patient's health.  Driving precautions and importance of weight loss were discussed.  Treatment options for sleep apnea were reviewed.  Will arrange for CPAP 9 cm H2O.

## 2011-05-01 NOTE — Progress Notes (Signed)
PFT done today. 

## 2011-05-01 NOTE — Progress Notes (Signed)
Subjective:    Patient ID: Steve Charles, male    DOB: 02-05-1946, 65 y.o.   MRN: [id number]  HPI CC: [name], [name]  65 yo male with dyspnea and sleep apnea.  He is here to follow up his sleep test and PFT.  PSG 03/26/11>>AHI 34.5, SpO2 low 78%.  CPAP 9 cm>>AHI 8, +REM/supine.  PFT 05/01/11>>FEV1 2.07(72%), FEV1% 65, TLC 6.79(111%), RV 3.42(146%), DLCO 69%, no BD>>mild obstruction, airtrapping, and moderate diffusion defect.  Past Medical History  Diagnosis Date  . DYSLIPIDEMIA 11/29/2009  . HYPERTENSION, ESSENTIAL 11/29/2009  . URTICARIA 02/06/2010  . BPH (benign prostatic hypertrophy)   . Osteoarthritis   . DM (diabetes mellitus)   . OSA (obstructive sleep apnea) 03/26/2011    CPAP 9 cm H2O  . COPD (chronic obstructive pulmonary disease) 05/01/2011    PFT 05/01/11>>FEV1 2.07(72%), FEV1% 65, TLC 6.79(111%), DLCO 69%, no BD     Family History  Problem Relation Age of Onset  . Arthritis Other   . Prostate cancer Brother   . Diabetes Other   . Cancer Father      History   Social History  . Marital Status: Married    Spouse Name: N/A    Number of Children: 3  . Years of Education: N/A   Occupational History  . Retired Other   Social History Main Topics  . Smoking status: Former Smoker -- 1.5 packs/day for 50 years    Types: Cigarettes    Quit date: 12/04/2010  . Smokeless tobacco: Never Used  . Alcohol Use: Yes     occasionallly  . Drug Use: No  . Sexually Active: Not on file   Other Topics Concern  . Not on file   Social History Narrative  . No narrative on file     Allergies  Allergen Reactions  . Codeine Sulfate     REACTION: hives  . Morphine And Related     Oral morphine  . Propoxyphene N-Acetaminophen     REACTION: hives  . Ramipril      Outpatient Prescriptions Prior to Visit  Medication Sig Dispense Refill  . aspirin 81 MG tablet Take 81 mg by mouth daily.        Marland Kitchen EPIPEN 2-PAK 0.3 MG/0.3ML DEVI Use as directed      .  insulin lispro protamine-insulin lispro (HUMALOG 75/25) (75-25) 100 UNIT/ML SUSP Take as directed      . metFORMIN (GLUCOPHAGE) 500 MG tablet Take by mouth. 2 pills twice daily       . Multiple Vitamin (MULTIVITAMIN) tablet Take 1 tablet by mouth 2 (two) times daily.        . rosuvastatin (CRESTOR) 5 MG tablet Take 5 mg by mouth daily.        . valsartan (DIOVAN) 160 MG tablet Take 160 mg by mouth daily.        . furosemide (LASIX) 40 MG tablet 2 tablets twice daily            Review of Systems     Objective:   Physical Exam  BP 90/60  Pulse 94  Temp(Src) 98.4 F (36.9 C) (Oral)  Ht 5\' 8"  (1.727 m)  Wt 299 lb (135.626 kg)  BMI 45.46 kg/m2  SpO2 91%     General - Obese  HEENT - Wears glasses, PERRLA, EOMI, no sinus tenderness, MP 3, no oral exudate, no LAN, no thyromegaly  Cardiac - s1s2, no murmur, pulses symmetric  Chest - diminished breath sounds,  no wheezing or rales  Abd - obese, soft, non-tender  Ext - no e/c/c  Neuro - normal strength, CN intact, A&O x 3  Psych - normal mood/behavior  Skin - no rashes  CHEST - 2 VIEW 03/24/11:  Comparison: 07/16/2006  Findings: Enlargement of cardiac silhouette. Mediastinal contours and pulmonary vascularity normal. Mild bronchitic changes without infiltrate or effusion. No pneumothorax. Prominence of right first costochondral junction stable.  IMPRESSION: Mild bronchitic changes. Enlargement of cardiac silhouette.      Assessment & Plan:   OSA (obstructive sleep apnea) He has severe sleep apnea.  I have reviewed his sleep test results with the patient.  Explained how sleep apnea can affect the patient's health.  Driving precautions and importance of weight loss were discussed.  Treatment options for sleep apnea were reviewed.  Will arrange for CPAP 9 cm H2O.  COPD with emphysema He has mild obstruction, airtrapping and diffusion defect on PFT today.  Will have him try spiriva.    Updated Medication  List Outpatient Encounter Prescriptions as of 05/01/2011  Medication Sig Dispense Refill  . aspirin 81 MG tablet Take 81 mg by mouth daily.        Marland Kitchen EPIPEN 2-PAK 0.3 MG/0.3ML DEVI Use as directed      . insulin lispro protamine-insulin lispro (HUMALOG 75/25) (75-25) 100 UNIT/ML SUSP Take as directed      . metFORMIN (GLUCOPHAGE) 500 MG tablet Take by mouth. 2 pills twice daily       . Multiple Vitamin (MULTIVITAMIN) tablet Take 1 tablet by mouth 2 (two) times daily.        . rosuvastatin (CRESTOR) 5 MG tablet Take 5 mg by mouth daily.        . valsartan (DIOVAN) 160 MG tablet Take 160 mg by mouth daily.        Marland Kitchen tiotropium (SPIRIVA HANDIHALER) 18 MCG inhalation capsule Place 1 capsule (18 mcg total) into inhaler and inhale daily.  30 capsule  11  . DISCONTD: furosemide (LASIX) 40 MG tablet 2 tablets twice daily

## 2011-05-01 NOTE — Assessment & Plan Note (Signed)
He has mild obstruction, airtrapping and diffusion defect on PFT today.  Will have him try spiriva.

## 2011-05-26 ENCOUNTER — Encounter: Payer: Self-pay | Admitting: Pulmonary Disease

## 2011-06-10 ENCOUNTER — Telehealth: Payer: Self-pay | Admitting: Pulmonary Disease

## 2011-06-10 DIAGNOSIS — G4733 Obstructive sleep apnea (adult) (pediatric): Secondary | ICD-10-CM

## 2011-06-10 NOTE — Telephone Encounter (Signed)
[email or web address] x1 

## 2011-06-10 NOTE — Telephone Encounter (Signed)
CPAP download 05/08/11 to 06/07/11>>Used on 30 of 30 nights with average 5hrs .  Average AHI 2.2 with CPAP 9 cm H2O.  Will have my nurse inform pt that CPAP report looked good.  No change to current CPAP set up.

## 2011-06-11 NOTE — Telephone Encounter (Signed)
Spoke with pt and is aware order was sent to increase pressure to 10 cm. Pt verbalized understanding

## 2011-06-11 NOTE — Telephone Encounter (Signed)
Spoke with pt and advised him that cpap report look good and no change in set up. Pt states he would like for his machine to be set at least to 10 cm. Pt states he feels like he would sleep better with a little high setting. Pt states when he wakes up in the middle of the night it does not have as much air blowing out of the machine as it did before he went to sleep. Please advise Dr. Craige Cotta. Thanks  Carver Fila, CMA

## 2011-06-11 NOTE — Telephone Encounter (Signed)
Please inform patient that I have sent order to his DME to increase CPAP to 10 cm H2O.

## 2011-06-13 ENCOUNTER — Encounter: Payer: Self-pay | Admitting: Family Medicine

## 2011-06-27 ENCOUNTER — Ambulatory Visit (INDEPENDENT_AMBULATORY_CARE_PROVIDER_SITE_OTHER): Payer: Medicare Other | Admitting: Pulmonary Disease

## 2011-06-27 ENCOUNTER — Encounter: Payer: Self-pay | Admitting: Family Medicine

## 2011-06-27 ENCOUNTER — Encounter: Payer: Self-pay | Admitting: Pulmonary Disease

## 2011-06-27 ENCOUNTER — Ambulatory Visit (INDEPENDENT_AMBULATORY_CARE_PROVIDER_SITE_OTHER): Payer: Medicare Other | Admitting: Family Medicine

## 2011-06-27 VITALS — BP 110/60 | HR 114 | Temp 98.1°F | Wt 302.2 lb

## 2011-06-27 DIAGNOSIS — F518 Other sleep disorders not due to a substance or known physiological condition: Secondary | ICD-10-CM

## 2011-06-27 DIAGNOSIS — G4733 Obstructive sleep apnea (adult) (pediatric): Secondary | ICD-10-CM

## 2011-06-27 DIAGNOSIS — J438 Other emphysema: Secondary | ICD-10-CM

## 2011-06-27 DIAGNOSIS — E119 Type 2 diabetes mellitus without complications: Secondary | ICD-10-CM

## 2011-06-27 DIAGNOSIS — Z72821 Inadequate sleep hygiene: Secondary | ICD-10-CM | POA: Insufficient documentation

## 2011-06-27 DIAGNOSIS — I10 Essential (primary) hypertension: Secondary | ICD-10-CM

## 2011-06-27 DIAGNOSIS — J439 Emphysema, unspecified: Secondary | ICD-10-CM

## 2011-06-27 NOTE — Assessment & Plan Note (Signed)
He feels spiriva has improved his breathing and exercise tolerance.  Will continue his current regimen.

## 2011-06-27 NOTE — Assessment & Plan Note (Signed)
I discussed with him sleep restriction and relaxation techniques.  If this is unsuccessful with improving his sleep pattern, then he can try using 3 to 5 mg melatonin 30 to 45 minutes before desired bedtime.

## 2011-06-27 NOTE — Progress Notes (Signed)
Subjective:    Patient ID: Steve Charles, male    DOB: 1946/02/09, 65 y.o.   MRN: [id number]  HPI CC: Steve Charles, Steve Charles  65 yo male former smoker with COPD and sleep apnea.  His breathing has been doing better since he started spiriva.  He was able to do some walking [place] this [place] w/o much trouble.  He is not having cough, wheeze, or sputum.  He feel that CPAP helps his sleep.  He uses this every night.  He does not have any trouble with the mask.  He feels like he could use more pressure.  He has trouble waking up during the night.  He goes to bed at 10 pm, and falls asleep quickly.  He will sleep through until about 3 am, and then wake up.  He feels wide awake, and will try to read.  He can sometimes fall back to sleep around 6 am.  He takes a nap in the afternoon for an hour on most days.  He does use his CPAP when taking naps.  Past Medical History  Diagnosis Date  . DYSLIPIDEMIA 11/29/2009  . HYPERTENSION, ESSENTIAL 11/29/2009  . URTICARIA 02/06/2010  . BPH (benign prostatic hypertrophy)   . Osteoarthritis   . DM (diabetes mellitus)   . OSA (obstructive sleep apnea) 03/26/2011    CPAP 9 cm H2O  . COPD (chronic obstructive pulmonary disease) 05/01/2011    PFT 05/01/11>>FEV1 2.07(72%), FEV1% 65, TLC 6.79(111%), DLCO 69%, no BD     Family History  Problem Relation Age of Onset  . Arthritis Other   . Prostate cancer Brother   . Diabetes Other   . Cancer Father      History   Social History  . Marital Status: Married    Spouse Name: N/A    Number of Children: 3   Occupational History  . Retired Other   Social History Main Topics  . Former Smoker -- 1.5 packs/day for 50 years    Cigarettes    12/04/2010  . Never Used  . Yes     occasionallly  . No       Allergies  Allergen Reactions  . Codeine Sulfate     REACTION: hives  . Morphine And Related     Oral morphine  . Propoxyphene N-Acetaminophen     REACTION: hives  .  Ramipril     Review of Systems     Objective:   Physical Exam BP 110/60  Pulse 114  Temp(Src) 98.1 F (36.7 C) (Oral)  Wt 302 lb 3.2 oz (137.077 kg)  SpO2 96%  General - Obese  HEENT - Wears glasses, PERRLA, EOMI, no sinus tenderness, MP 3, no oral exudate, no LAN, no thyromegaly  Cardiac - s1s2, no murmur, pulses symmetric  Chest - diminished breath sounds, no wheezing or rales  Abd - obese, soft, non-tender  Ext - no e/c/c  Neuro - normal strength, CN intact, A&O x 3  Psych - normal mood/behavior  Skin - no rashes     Assessment & Plan:   OSA (obstructive sleep apnea) He is compliant with CPAP.  He feels this has helped his sleep and energy, but he is not sure he is getting enough pressure.  Will increase his setting to 10 cm H2O.  He is to call if he is still having trouble.  COPD with emphysema He feels spiriva has improved his breathing and exercise tolerance.  Will  continue his current regimen.  Inadequate sleep hygiene I discussed with him sleep restriction and relaxation techniques.  If this is unsuccessful with improving his sleep pattern, then he can try using 3 to 5 mg melatonin 30 to 45 minutes before desired bedtime.    Updated Medication List Outpatient Encounter Prescriptions as of 06/27/2011  Medication Sig Dispense Refill  . aspirin 81 MG tablet Take 81 mg by mouth daily.        Marland Kitchen EPIPEN 2-PAK 0.3 MG/0.3ML DEVI Use as directed      . Exenatide (BYETTA 10 MCG PEN [place]) Twice a day       . furosemide (LASIX) 40 MG tablet Take 40 mg by mouth daily as needed.        . insulin lispro protamine-insulin lispro (HUMALOG 75/25) (75-25) 100 UNIT/ML SUSP Take as directed      . metFORMIN (GLUCOPHAGE) 500 MG tablet Take by mouth. 2 pills twice daily       . Multiple Vitamin (MULTIVITAMIN) tablet Take 1 tablet by mouth 2 (two) times daily.        . rosuvastatin (CRESTOR) 5 MG tablet Take 5 mg by mouth daily.        Marland Kitchen tiotropium (SPIRIVA HANDIHALER) 18 MCG inhalation  capsule Place 1 capsule (18 mcg total) into inhaler and inhale daily.  30 capsule  11  . valsartan (DIOVAN) 160 MG tablet Take 160 mg by mouth daily.

## 2011-06-27 NOTE — Progress Notes (Signed)
  Subjective:    Patient ID: Steve Charles, male    DOB: [birth year], 65 y.o.   MRN: [id number]  HPI Patient here for medical followup. Past medical history significant for obesity, type 2 diabetes, dyslipidemia, hypertension, osteoarthritis, COPD, and obstructive sleep apnea. Followed by pulmonologist. Recent initiation CPAP with plans to titrate up. Has not noted much improvement in energy level since starting this.  Type 2 diabetes with suboptimal control. Recent A1c 7.7%. Reinitiating [place].  Has had some steady weight gain since starting insulin several months ago. Peripheral edema treated with furosemide. Blood pressure stable. No recent hypoglycemia. Plans to start more consistent exercise soon.  Past Medical History  Diagnosis Date  . DYSLIPIDEMIA 11/29/2009  . HYPERTENSION, ESSENTIAL 11/29/2009  . URTICARIA 02/06/2010  . BPH (benign prostatic hypertrophy)   . Osteoarthritis   . DM (diabetes mellitus)   . OSA (obstructive sleep apnea) 03/26/2011    CPAP 9 cm H2O  . COPD (chronic obstructive pulmonary disease) 05/01/2011    PFT 05/01/11>>FEV1 2.07(72%), FEV1% 65, TLC 6.79(111%), DLCO 69%, no BD   Past Surgical History  Procedure Date  . Appendectomy 1963  . Spine surgery     spinal fusion - L4, L5  . Total knee arthroplasty 2007  . Transurethral resection of prostate   . Nasal sinus surgery 1989    reports that he quit smoking about 6 months ago. His smoking use included Cigarettes. He has a 75 pack-year smoking history. He has never used smokeless tobacco. He reports that he drinks alcohol. He reports that he does not use illicit drugs. family history includes Arthritis in his other; Cancer in his father; Diabetes in his other; and Prostate cancer in his brother. Allergies  Allergen Reactions  . Codeine Sulfate     REACTION: hives  . Morphine And Related     Oral morphine  . Propoxyphene N-Acetaminophen     REACTION: hives  . Ramipril       Review of Systems    Constitutional: Negative for fever, fatigue and unexpected weight change.  Respiratory: Negative for cough and shortness of breath.   Cardiovascular: Positive for leg swelling. Negative for chest pain and palpitations.  Gastrointestinal: Negative for abdominal pain.  Genitourinary: Negative for dysuria.  Skin: Positive for color change.  Neurological: Negative for dizziness and headaches.  Hematological: Positive for adenopathy.  Psychiatric/Behavioral: Negative for dysphoric mood.       Objective:   Physical Exam  Constitutional: He appears well-developed and well-nourished.  HENT:  Right Ear: External ear normal.  Left Ear: External ear normal.  Mouth/Throat: Oropharynx is clear and moist.  Neck: Neck supple. No thyromegaly present.  Cardiovascular: Normal rate and regular rhythm.   Pulmonary/Chest: Effort normal and breath sounds normal. No respiratory distress. He has no wheezes. He has no rales.  Musculoskeletal:       Trace nonpitting edema legs bilaterally  Lymphadenopathy:    He has no cervical adenopathy.  Psychiatric: He has a normal mood and affect. His behavior is normal.          Assessment & Plan:  #1 type 2 diabetes. Followed by endocrinology. Discussed weight loss strategies #2 obstructive sleep apnea. Patient on CPAP with pulmonary followup. Emphasized weight loss #3 hypertension stable

## 2011-06-27 NOTE — Patient Instructions (Signed)
Follow up in 6 months 

## 2011-06-27 NOTE — Assessment & Plan Note (Signed)
He is compliant with CPAP.  He feels this has helped his sleep and energy, but he is not sure he is getting enough pressure.  Will increase his setting to 10 cm H2O.  He is to call if he is still having trouble.

## 2011-07-20 ENCOUNTER — Telehealth: Payer: Self-pay | Admitting: Pulmonary Disease

## 2011-07-20 DIAGNOSIS — G4733 Obstructive sleep apnea (adult) (pediatric): Secondary | ICD-10-CM

## 2011-07-20 NOTE — Telephone Encounter (Signed)
CPAP 06/09/11 to 07/08/11>>Used on 30 of 30 nights with average 6 hrs 26 min.  Average AHI 2 with CPAP 10 cm H2O.  Will have my nurse inform pt that CPAP report looked great.  Will continue with CPAP 10 cm H2O.

## 2011-07-21 NOTE — Telephone Encounter (Signed)
PATIENT WAS GIVEN MESSAGE

## 2011-07-21 NOTE — Telephone Encounter (Signed)
lmomtcb  

## 2011-08-26 NOTE — H&P (Signed)
  NAMEDIETER, Steve              ACCOUNT NO.:  [id number]  MEDICAL RECORD NO.:  [id number]           PATIENT TYPE:  O  LOCATION:  FOOT                         FACILITY:  [facility]  PHYSICIAN:  Joanne Gavel, M.D.        DATE OF BIRTH:  June 20, 1946  DATE OF ADMISSION:  03/14/2011 DATE OF DISCHARGE:                             HISTORY & PHYSICAL   CHIEF COMPLAINT:  Wound, left foreleg.  HISTORY OF PRESENT ILLNESS:  This is a 65 year old diabetic male insulin dependent 2 weeks ago with no trauma developed an ulceration of his left leg.  He has previously banged the left leg and had wounds which healed spontaneously.  He has a long history of discoloration, but he denies any known venous disease.  He denies any history of intermittent claudication, transient ischemic attack, or other signs of peripheral vascular disease.  He has had diabetes for 15 years with some neuropathy and he is now on insulin.  Cigarettes, he smoked for 30 plus years, he quit 3 months ago.  Alcohol, occasionally.  MEDICATIONS:  Furosemide, metformin, Diovan, Crestor, Humalog, insulin, and Lasix.  ALLERGY:  CODEINE, HYDROCODONE, OXYCODONE, SHELLFISH, BANANA, all cause hives.  OTHER PAST MEDICAL HISTORY:  He had some chest pain and underwent coronary angiogram 1 month ago and this was essentially normal.  PAST SURGICAL HISTORY:  He had a left total knee operation and back surgery.  PHYSICAL EXAMINATION:  GENERAL APPEARANCE:  Well developed, well nourished in no distress. HEAD:  Normocephalic. EYES, EARS, NOSE, THROAT:  Normal. VITAL SIGNS:  Temperature 98, pulse 100, respirations 18, blood pressure 94/62. CHEST:  Clear. HEART:  Regular rhythm. ABDOMEN:  Obese, negative. EXTREMITIES:  Peripheral pulses are palpable but weak and symmetrical. ABI is 0.6.  There are stasis changes bilaterally on the lateral aspect of the left lower extremity just above the lateral malleolus.  There is a 1.6 x 1.4 superficial  ulceration covered with slough.  IMPRESSION:  Probably varicose venous ulcer, slough is cleared.  Wound is treated with silver collagen and a wrap.  We plan to order arterial and venous studies and probably go to [facility] if the arterial studies are good.     Joanne Gavel, M.D.     RA/MEDQ  D:  03/14/2011  T:  03/15/2011  Job:  [id number]  cc:   Evelena Peat, M.D. Dr. Cleon Gustin.  Electronically Signed by Joanne Gavel M.D. on 08/26/2011 08:57:00 AM

## 2011-09-05 ENCOUNTER — Ambulatory Visit: Payer: Medicare Other | Admitting: Family Medicine

## 2011-09-09 ENCOUNTER — Encounter: Payer: Self-pay | Admitting: Family Medicine

## 2011-09-09 ENCOUNTER — Ambulatory Visit (INDEPENDENT_AMBULATORY_CARE_PROVIDER_SITE_OTHER): Payer: Medicare Other | Admitting: Family Medicine

## 2011-09-09 DIAGNOSIS — J310 Chronic rhinitis: Secondary | ICD-10-CM

## 2011-09-09 DIAGNOSIS — Z23 Encounter for immunization: Secondary | ICD-10-CM

## 2011-09-09 MED ORDER — AZELASTINE HCL 0.1 % NA SOLN: 1.0000 | Freq: Two times a day (BID) | NASAL | Status: DC

## 2011-09-09 NOTE — Progress Notes (Signed)
  Subjective:    Patient ID: Steve Charles, male    DOB: 09-30-1946, 65 y.o.   MRN: [id number]  HPI  Patient seen with nasal congestion. Several days of nasal congestion with clear mucus. Intermittent sneezing. States occasionally antihistamine use without much improvement. No fever. No headaches. Occasional dry cough. Still smoking. History of COPD followed by pulmonary. Uses saline nasal irrigation intermittently. Needs flu vaccine  Review of Systems  Constitutional: Negative for fever and chills.  HENT: Positive for congestion.   Respiratory: Positive for cough. Negative for shortness of breath and wheezing.   Cardiovascular: Negative for chest pain.       Objective:   Physical Exam  Constitutional: He appears well-developed and well-nourished.  HENT:  Right Ear: External ear normal.  Left Ear: External ear normal.  Mouth/Throat: Oropharynx is clear and moist.  Neck: Neck supple.  Cardiovascular: Normal rate and regular rhythm.   Pulmonary/Chest:       Occasional faint wheezes  Lymphadenopathy:    He has no cervical adenopathy.          Assessment & Plan:  Rhinitis, suspect allergic. Trial of Astelin nasal 2 sprays per nostril twice daily as needed

## 2011-09-23 ENCOUNTER — Telehealth: Payer: Self-pay | Admitting: Family Medicine

## 2011-09-23 MED ORDER — VALSARTAN 160 MG PO TABS: 160.0000 mg | ORAL_TABLET | Freq: Every day | ORAL | Status: DC

## 2011-09-23 MED ORDER — ROSUVASTATIN CALCIUM 5 MG PO TABS: 5.0000 mg | ORAL_TABLET | Freq: Every day | ORAL | Status: DC

## 2011-09-23 NOTE — Telephone Encounter (Signed)
Needs new rxs for Diovan and Crestor to [facility] [address]. Thanks.

## 2011-10-08 ENCOUNTER — Telehealth: Payer: Self-pay | Admitting: Pulmonary Disease

## 2011-10-08 DIAGNOSIS — G4733 Obstructive sleep apnea (adult) (pediatric): Secondary | ICD-10-CM

## 2011-10-08 NOTE — Telephone Encounter (Signed)
Called spoke with patient who reports that his CPAP is not holding pressure throughout the night - it's not "blowing as hard when he wakes up as it was when he went to sleep."  Pressure is at 10 currently, and feels that increasing it "a couple of points might help."  Pt uses [facility].  Dr Craige Cotta please advise, thanks.

## 2011-10-08 NOTE — Telephone Encounter (Signed)
Pt notified of pressure change and download. He will contact our office if he has any problems or questions.

## 2011-10-08 NOTE — Telephone Encounter (Signed)
Please advise pt that I have sent order to increase CPAP to 12 cm H2O, and will have his DME send download after 2 weeks on new pressure.

## 2011-12-09 ENCOUNTER — Encounter: Payer: Self-pay | Admitting: Pulmonary Disease

## 2011-12-09 ENCOUNTER — Telehealth: Payer: Self-pay | Admitting: Pulmonary Disease

## 2011-12-09 NOTE — Telephone Encounter (Signed)
CPAP 10/16/11 to 12/04/11>>Used on 50 of 50 nights with average 4 hrs 12 min.  Average AHI 3.8 with CPAP 12 cm H2O.  Will have my nurse inform patient that CPAP report showed good control of sleep apnea with pressure of 12 cm H2O.

## 2011-12-10 NOTE — Telephone Encounter (Signed)
Returning Mindy's call

## 2011-12-10 NOTE — Telephone Encounter (Signed)
I spoke with pt and he states he is not sleeping well and can only sleep 3-3.5 hrs a night. He states he is able to sleep 7 hrs 2-3 times a week. Pt states he can't really tell a difference when his pressure is changed. Pt is coming in to see VS on 01/05/12 at 1:30 to discuss his sleep problems with VS. Will forward to VS as an [place]

## 2011-12-10 NOTE — Telephone Encounter (Signed)
Noted  

## 2011-12-10 NOTE — Telephone Encounter (Signed)
[email or web address] x1 

## 2011-12-16 ENCOUNTER — Encounter: Payer: Self-pay | Admitting: Family Medicine

## 2011-12-16 ENCOUNTER — Ambulatory Visit (INDEPENDENT_AMBULATORY_CARE_PROVIDER_SITE_OTHER): Payer: Medicare Other | Admitting: Family Medicine

## 2011-12-16 VITALS — BP 112/60 | Temp 98.6°F | Wt 294.0 lb

## 2011-12-16 DIAGNOSIS — E119 Type 2 diabetes mellitus without complications: Secondary | ICD-10-CM | POA: Diagnosis not present

## 2011-12-16 DIAGNOSIS — I10 Essential (primary) hypertension: Secondary | ICD-10-CM

## 2011-12-16 DIAGNOSIS — E785 Hyperlipidemia, unspecified: Secondary | ICD-10-CM

## 2011-12-16 DIAGNOSIS — G4700 Insomnia, unspecified: Secondary | ICD-10-CM | POA: Diagnosis not present

## 2011-12-16 NOTE — Progress Notes (Signed)
  Subjective:    Patient ID: Steve Charles, male    DOB: 1946-04-13, 66 y.o.   MRN: [id number]  HPI  Medical followup. He has history of type 2 diabetes, CAD, hypertension, obstructive sleep apnea, osteoarthritis. He is followed by endocrinologist is following his A1c. Recent A1c 7.6%. Recent adjustment of CPAP. Very poor sleep usually 3-4 hours per night. Occasional daytime naps. No consistent use of nighttime alcohol. No excessive caffeine use. Denies depression issues.  Poor compliance with exercise. Still smoking some. No recent chest pains. Hyperlipidemia treated with Crestor. Will need lab work soon regarding that. Compliant with all medications. Denies medication side effects  Past Medical History  Diagnosis Date  . DYSLIPIDEMIA 11/29/2009  . HYPERTENSION, ESSENTIAL 11/29/2009  . URTICARIA 02/06/2010  . BPH (benign prostatic hypertrophy)   . Osteoarthritis   . DM (diabetes mellitus)   . OSA (obstructive sleep apnea) 03/26/2011    CPAP 9 cm H2O  . COPD (chronic obstructive pulmonary disease) 05/01/2011    PFT 05/01/11>>FEV1 2.07(72%), FEV1% 65, TLC 6.79(111%), DLCO 69%, no BD   Past Surgical History  Procedure Date  . Appendectomy 1963  . Spine surgery     spinal fusion - L4, L5  . Total knee arthroplasty 2007  . Transurethral resection of prostate   . Nasal sinus surgery 1989    reports that he quit smoking about a year ago. His smoking use included Cigarettes. He has a 75 pack-year smoking history. He has never used smokeless tobacco. He reports that he drinks alcohol. He reports that he does not use illicit drugs. family history includes Arthritis in his other; Cancer in his father; Diabetes in his other; and Prostate cancer in his brother. Allergies  Allergen Reactions  . Codeine Sulfate     REACTION: hives  . Morphine And Related     Oral morphine  . Propoxyphene N-Acetaminophen     REACTION: hives  . Ramipril       Review of Systems  Constitutional: Negative  for fatigue.  Eyes: Negative for visual disturbance.  Respiratory: Negative for cough, chest tightness and shortness of breath.   Cardiovascular: Negative for chest pain, palpitations and leg swelling.  Genitourinary: Negative for dysuria.  Neurological: Negative for dizziness, syncope, weakness, light-headedness and headaches.  Psychiatric/Behavioral: Negative for dysphoric mood.       Objective:   Physical Exam  Constitutional: He is oriented to person, place, and time. He appears well-developed and well-nourished.  Neck: Neck supple. No thyromegaly present.  Cardiovascular: Normal rate, regular rhythm and normal heart sounds.   Pulmonary/Chest: Effort normal and breath sounds normal. No respiratory distress. He has no wheezes. He has no rales.  Musculoskeletal: He exhibits no edema.  Neurological: He is alert and oriented to person, place, and time.          Assessment & Plan:  #1 hypertension stable on current medications #2 type 2 diabetes followed by endocrinology  #3 obstructive sleep apnea. Discussed importance of weight loss  #4 insomnia. Sleep hygiene discussed. He's not interested sedative hypnotics. Avoid regular alcohol use.

## 2011-12-31 DIAGNOSIS — R609 Edema, unspecified: Secondary | ICD-10-CM | POA: Diagnosis not present

## 2011-12-31 DIAGNOSIS — I10 Essential (primary) hypertension: Secondary | ICD-10-CM | POA: Diagnosis not present

## 2011-12-31 DIAGNOSIS — E7800 Pure hypercholesterolemia, unspecified: Secondary | ICD-10-CM | POA: Diagnosis not present

## 2012-01-05 ENCOUNTER — Encounter: Payer: Self-pay | Admitting: Pulmonary Disease

## 2012-01-05 ENCOUNTER — Ambulatory Visit (INDEPENDENT_AMBULATORY_CARE_PROVIDER_SITE_OTHER): Payer: Medicare Other | Admitting: Pulmonary Disease

## 2012-01-05 VITALS — BP 120/78 | HR 95 | Temp 98.2°F | Ht 68.0 in | Wt 295.3 lb

## 2012-01-05 DIAGNOSIS — F518 Other sleep disorders not due to a substance or known physiological condition: Secondary | ICD-10-CM | POA: Diagnosis not present

## 2012-01-05 DIAGNOSIS — Z72821 Inadequate sleep hygiene: Secondary | ICD-10-CM

## 2012-01-05 DIAGNOSIS — G4733 Obstructive sleep apnea (adult) (pediatric): Secondary | ICD-10-CM

## 2012-01-05 DIAGNOSIS — J438 Other emphysema: Secondary | ICD-10-CM

## 2012-01-05 DIAGNOSIS — J439 Emphysema, unspecified: Secondary | ICD-10-CM

## 2012-01-05 NOTE — Progress Notes (Signed)
Chief Complaint  Patient presents with  . Follow-up    Pt says he uses CPAP every night, avg 3 hours at a time. He says the air pressure isn't high enough.   CC: [name]   History of Present Illness: JWAN HORNBAKER is a 66 y.o. male former smoker with COPD and OSA.  He stopped using spiriva.  He has felt fine w/o using inhalers.  He continues to have trouble with his sleep.  He goes to bed at 7 pm.  He sleeps for 5 to 6 hours w/o trouble, and then wakes up.  He is awake for the rest of the night.  He will get sleepy in the afternoon, but tries not to nap.  As a result of this his need to sleep is so strong that he has to go to bed at 7 pm.  He uses his CPAP for the entire time he is asleep.  He is not sure he is getting enough pressure.  CPAP download 05/09/11 to 01/04/12>>Used on 241 of 241 nights with average 4 hr 45 min.  Average AHI 2.7 with CPAP 10 cm H2O and minimal airleak.   Past Medical History  Diagnosis Date  . DYSLIPIDEMIA 11/29/2009  . HYPERTENSION, ESSENTIAL 11/29/2009  . URTICARIA 02/06/2010  . BPH (benign prostatic hypertrophy)   . Osteoarthritis   . DM (diabetes mellitus)   . OSA (obstructive sleep apnea) 03/26/2011    CPAP 9 cm H2O  . COPD (chronic obstructive pulmonary disease) 05/01/2011    PFT 05/01/11>>FEV1 2.07(72%), FEV1% 65, TLC 6.79(111%), DLCO 69%, no BD    Past Surgical History  Procedure Date  . Appendectomy 1963  . Spine surgery     spinal fusion - L4, L5  . Total knee arthroplasty 2007  . Transurethral resection of prostate   . Nasal sinus surgery 1989    Allergies  Allergen Reactions  . Codeine Sulfate     REACTION: hives  . Morphine And Related     Oral morphine  . Propoxyphene N-Acetaminophen     REACTION: hives  . Ramipril     Physical Exam:  Blood pressure 120/78, pulse 95, temperature 98.2 F (36.8 C), temperature source Oral, height 5\' 8"  (1.727 m), weight 295 lb 4.8 oz (133.947 kg), SpO2 98.00%. Body mass index is 44.90  kg/(m^2). Wt Readings from Last 2 Encounters:  01/05/12 295 lb 4.8 oz (133.947 kg)  12/16/11 294 lb (133.358 kg)   General - Obese  HEENT - Wears glasses, PERRLA, EOMI, no sinus tenderness, MP 3, no oral exudate, no LAN, no thyromegaly  Cardiac - s1s2, no murmur, pulses symmetric  Chest - diminished breath sounds, no wheezing or rales  Abd - obese, soft, non-tender  Ext - no e/c/c  Neuro - normal strength, CN intact, A&O x 3  Psych - normal mood/behavior  Skin - no rashes  Assessment/Plan:  Outpatient Encounter Prescriptions as of 01/05/2012  Medication Sig Dispense Refill  . aspirin 81 MG tablet Take 81 mg by mouth daily.        Marland Kitchen EPIPEN 2-PAK 0.3 MG/0.3ML DEVI Use as directed      . EXENATIDE [place] Inject into the skin. Weekly injections      . furosemide (LASIX) 40 MG tablet Take 40 mg by mouth daily as needed.        . insulin lispro protamine-insulin lispro (HUMALOG 75/25) (75-25) 100 UNIT/ML SUSP Take as directed      . metFORMIN (GLUCOPHAGE) 500 MG tablet  Take by mouth. 2 pills twice daily       . Multiple Vitamin (MULTIVITAMIN) tablet Take 1 tablet by mouth 2 (two) times daily.        . rosuvastatin (CRESTOR) 5 MG tablet Take 1 tablet (5 mg total) by mouth daily.  90 tablet  3  . valsartan (DIOVAN) 160 MG tablet Take 1 tablet (160 mg total) by mouth daily.  90 tablet  3  . DISCONTD: Exenatide (BYETTA 10 MCG PEN [place]) Twice a day         Meridee Branum Pager:  [phone] 01/05/2012, 1:56 PM

## 2012-01-05 NOTE — Assessment & Plan Note (Signed)
He continues to have trouble with his sleep.  He feels like he is not getting enough pressure from his machine.  Will change him to auto CPAP setting with range 5 to 15 cm H2O.  Will call him to assess status after reviewing download in one month.

## 2012-01-05 NOTE — Assessment & Plan Note (Signed)
He stopped spiriva on his own.  He has not noticed any more trouble with his breathing since stopping inhalers.  Will monitor clinically.

## 2012-01-05 NOTE — Assessment & Plan Note (Signed)
Advised that he could try short naps in the afternoon provided these do not interfere with his daytime schedule, or nighttime sleep.  Explained he would need to use his CPAP when he naps.

## 2012-01-05 NOTE — Patient Instructions (Signed)
Will arrange for auto CPAP set up>>will call after getting download from machine in one month Follow up in 6 months

## 2012-02-25 ENCOUNTER — Encounter: Payer: Medicare Other | Attending: Endocrinology | Admitting: Dietician

## 2012-02-25 VITALS — Ht 68.0 in | Wt 289.6 lb

## 2012-02-25 DIAGNOSIS — R635 Abnormal weight gain: Secondary | ICD-10-CM | POA: Insufficient documentation

## 2012-02-25 DIAGNOSIS — E119 Type 2 diabetes mellitus without complications: Secondary | ICD-10-CM | POA: Insufficient documentation

## 2012-02-25 DIAGNOSIS — Z713 Dietary counseling and surveillance: Secondary | ICD-10-CM | POA: Diagnosis not present

## 2012-02-28 ENCOUNTER — Encounter: Payer: Self-pay | Admitting: Dietician

## 2012-02-28 NOTE — Progress Notes (Signed)
  Patient was seen on 02/25/2012 for the first of a series of three diabetes self-management courses at the [facility]. The following learning objectives were met by the patient during this course:   Defines the role of glucose and insulin  Identifies type of diabetes and pathophysiology  Defines the diagnostic criteria for diabetes and prediabetes  States the risk factors for Type 2 Diabetes  States the symptoms of Type 2 Diabetes  Defines Type 2 Diabetes treatment goals  Defines Type 2 Diabetes treatment options  States the rationale for glucose monitoring  Identifies A1C, glucose targets, and testing times  Identifies proper sharps disposal  Defines the purpose of a diabetes food plan  Identifies carbohydrate food groups  Defines effects of carbohydrate foods on glucose levels  Identifies carbohydrate choices/grams/food labels  States benefits of physical activity and effect on glucose  Review of suggested activity guidelines  Handouts given during class include:  Type 2 Diabetes: Basics Book  My Food Plan Book  Food and Activity Log  HgA1C: 8.6 %  Patient has established the following initial goals:  Increase exercise  Stop smoking  Lose weight  Follow-Up Plan: Attend the Diabetes Self-Management Core Class Series

## 2012-03-08 ENCOUNTER — Encounter: Payer: Self-pay | Admitting: Cardiology

## 2012-03-08 ENCOUNTER — Ambulatory Visit (INDEPENDENT_AMBULATORY_CARE_PROVIDER_SITE_OTHER): Payer: Medicare Other | Admitting: Cardiology

## 2012-03-08 VITALS — BP 100/58 | HR 101 | Ht 68.0 in | Wt 285.0 lb

## 2012-03-08 DIAGNOSIS — F17200 Nicotine dependence, unspecified, uncomplicated: Secondary | ICD-10-CM | POA: Diagnosis not present

## 2012-03-08 DIAGNOSIS — I2510 Atherosclerotic heart disease of native coronary artery without angina pectoris: Secondary | ICD-10-CM

## 2012-03-08 DIAGNOSIS — G459 Transient cerebral ischemic attack, unspecified: Secondary | ICD-10-CM | POA: Diagnosis not present

## 2012-03-08 DIAGNOSIS — E785 Hyperlipidemia, unspecified: Secondary | ICD-10-CM | POA: Diagnosis not present

## 2012-03-08 DIAGNOSIS — Z720 Tobacco use: Secondary | ICD-10-CM

## 2012-03-08 NOTE — Assessment & Plan Note (Signed)
Patient counseled on discontinuing. 

## 2012-03-08 NOTE — Assessment & Plan Note (Signed)
Patient with intermittent left upper extremity pain and numbness. Will arrange carotid Dopplers but symptoms seem more consistent with cervical disc problem. He has had issues with his neck previously and will followup with neurosurgery.

## 2012-03-08 NOTE — Assessment & Plan Note (Signed)
Continue statin. Lipids and liver monitored by primary care. 

## 2012-03-08 NOTE — Progress Notes (Signed)
HPI: Pleasant male I saw in Feb 2012 for evaluation of palpitations and dyspnea. Patient states that he has had atrial fibrillation after surgeries in the past but no other cardiac history. Echocardiogram in March of 2012 showed normal LV function and question mild RV dysfunction. Myoview in March 2012 showed extensive sub-diaphragmatic attenuation; EF 58%. Inferior ischemia cannot be excluded. TSH and BNP were normal. Cardionet revealed Sinus with PVCs. Cardiac cath in April of 2012 revealed normal LV function, normal pulmonary pressures and pulmonary capillary wedge pressure and nonobstructive coronary disease with the most significant lesion being a 50% LAD. He has been diagnosed with COPD and asthma. I last saw him in May of 2012. Since then, the patient has dyspnea with more extreme activities but not with routine activities. It is relieved with rest. It is not associated with chest pain. There is no orthopnea, PND or pedal edema. There is no syncope or palpitations. There is no exertional chest pain. He does describe occasional left-sided headache associated with left upper extremity numbness. He has also had some pain in his left upper extremity that is continuous.   Current Outpatient Prescriptions  Medication Sig Dispense Refill  . aspirin 81 MG tablet Take 81 mg by mouth daily.        Marland Kitchen EPIPEN 2-PAK 0.3 MG/0.3ML DEVI Use as directed      . EXENATIDE [place] Inject into the skin. Weekly injections      . furosemide (LASIX) 40 MG tablet Take 40 mg by mouth daily as needed.        . metFORMIN (GLUCOPHAGE) 500 MG tablet Take by mouth. 2 pills twice daily       . Multiple Vitamin (MULTIVITAMIN) tablet Take 1 tablet by mouth 2 (two) times daily.        . rosuvastatin (CRESTOR) 5 MG tablet Take 1 tablet (5 mg total) by mouth daily.  90 tablet  3  . valsartan (DIOVAN) 160 MG tablet Take 1 tablet (160 mg total) by mouth daily.  90 tablet  3  . insulin lispro protamine-insulin lispro (HUMALOG 75/25)  (75-25) 100 UNIT/ML SUSP Take as directed         Past Medical History  Diagnosis Date  . DYSLIPIDEMIA 11/29/2009  . HYPERTENSION, ESSENTIAL 11/29/2009  . URTICARIA 02/06/2010  . BPH (benign prostatic hypertrophy)   . Osteoarthritis   . DM (diabetes mellitus)   . OSA (obstructive sleep apnea) 03/26/2011    CPAP 9 cm H2O  . COPD (chronic obstructive pulmonary disease) 05/01/2011    PFT 05/01/11>>FEV1 2.07(72%), FEV1% 65, TLC 6.79(111%), DLCO 69%, no BD  . CAD (coronary artery disease)     Past Surgical History  Procedure Date  . Appendectomy 1963  . Spine surgery     spinal fusion - L4, L5  . Total knee arthroplasty 2007  . Transurethral resection of prostate   . Nasal sinus surgery 1989    History   Social History  . Marital Status: Married    Spouse Name: N/A    Number of Children: 3  . Years of Education: N/A   Occupational History  . Retired Other   Social History Main Topics  . Smoking status: Current Everyday Smoker -- 1.0 packs/day for 50 years    Types: Cigarettes    Last Attempt to Quit: 12/04/2010  . Smokeless tobacco: Never Used   Comment: pt is again smoking < 1 PPD  . Alcohol Use: Yes     occasionallly  . Drug  Use: No  . Sexually Active: Not on file   Other Topics Concern  . Not on file   Social History Narrative  . No narrative on file    ROS: Headaches and left upper extremity pain but no fevers or chills, productive cough, hemoptysis, dysphasia, odynophagia, melena, hematochezia, dysuria, hematuria, rash, seizure activity, orthopnea, PND, pedal edema, claudication. Remaining systems are negative.  Physical Exam: Well-developed well-nourished in no acute distress.  Skin is warm and dry.  HEENT is normal.  Neck is supple.  Chest is clear to auscultation with normal expansion.  Cardiovascular exam is regular rate and rhythm.  Abdominal exam nontender or distended. No masses palpated. Extremities show chronic skin changes and trace  edema. neuro grossly intact  ECG sinus rhythm at a rate of 101. No ST changes.

## 2012-03-08 NOTE — Assessment & Plan Note (Signed)
Continue aspirin and statin. 

## 2012-03-08 NOTE — Patient Instructions (Signed)
Your physician recommends that you schedule a follow-up appointment in: AS NEEDED  Your physician has requested that you have a carotid duplex. This test is an ultrasound of the carotid arteries in your neck. It looks at blood flow through these arteries that supply the brain with blood. Allow one hour for this exam. There are no restrictions or special instructions.   

## 2012-03-11 ENCOUNTER — Encounter: Payer: Medicare Other | Attending: Endocrinology

## 2012-03-11 DIAGNOSIS — Z713 Dietary counseling and surveillance: Secondary | ICD-10-CM | POA: Diagnosis not present

## 2012-03-11 DIAGNOSIS — E119 Type 2 diabetes mellitus without complications: Secondary | ICD-10-CM | POA: Insufficient documentation

## 2012-03-11 DIAGNOSIS — R635 Abnormal weight gain: Secondary | ICD-10-CM | POA: Diagnosis not present

## 2012-03-22 ENCOUNTER — Encounter (INDEPENDENT_AMBULATORY_CARE_PROVIDER_SITE_OTHER): Payer: Medicare Other

## 2012-03-22 DIAGNOSIS — G459 Transient cerebral ischemic attack, unspecified: Secondary | ICD-10-CM

## 2012-03-22 DIAGNOSIS — R209 Unspecified disturbances of skin sensation: Secondary | ICD-10-CM | POA: Diagnosis not present

## 2012-03-23 NOTE — Progress Notes (Signed)
  Patient was seen on 03/11/2012 for the second of a series of three diabetes self-management courses at the [facility]. The following learning objectives were met by the patient during this course:   Explain basic nutrition maintenance and quality assurance  Describe causes, symptoms and treatment of hypoglycemia and hyperglycemia  Explain how to manage diabetes during illness  Describe the importance of good nutrition for health and healthy eating strategies  List strategies to follow meal plan when dining out  Describe the effects of alcohol on glucose and how to use it safely  Describe problem solving skills for day-to-day glucose challenges  Describe strategies to use when treatment plan needs to change  Identify important factors involved in successful weight loss  Describe ways to remain physically active  Describe the impact of regular activity on insulin resistance  Handouts given in class:  Refrigerator magnet for Sick Day Guidelines  [facility] Oral medication/insulin handout  Follow-Up Plan: Patient will attend the final class of the ADA Diabetes Self-Care Education.   

## 2012-03-25 ENCOUNTER — Encounter: Payer: Medicare Other | Admitting: Dietician

## 2012-03-25 DIAGNOSIS — Z713 Dietary counseling and surveillance: Secondary | ICD-10-CM | POA: Diagnosis not present

## 2012-03-25 DIAGNOSIS — R635 Abnormal weight gain: Secondary | ICD-10-CM | POA: Diagnosis not present

## 2012-03-25 DIAGNOSIS — E119 Type 2 diabetes mellitus without complications: Secondary | ICD-10-CM | POA: Diagnosis not present

## 2012-03-25 NOTE — Progress Notes (Signed)
  Patient was seen on 03/25/2012 for the third of a series of three diabetes self-management courses at the [facility]. The following learning objectives were met by the patient during this course:    Describe how diabetes changes over time   Identify diabetes complications and ways to prevent them   Describe strategies that can promote heart health including lowering blood pressure and cholesterol   Describe strategies to lower dietary fat and sodium in the diet   Identify physical activities that benefit cardiovascular health   Evaluate success in meeting personal goal   Describe the belief that they can live successfully with diabetes day to day   Establish 2-3 goals that they will plan to diligently work on until they return for the free 22-month follow-up visit  The following handouts were given in class:  3 Month Follow Up Visit handout  Goal setting handout  Class evaluation form  Your patient has established the following 3 month goals for diabetes self-care:  Count carbohydrates at most of my meals  Increase my activity at least 7 days a week.  Be active 20 minutes or more 5 times per week  Reduce my fat intake in my diet.  Take my diabetes medications as scheduled.  Look for patterns in my record book at least 30 days a month.  Test my glucose at least four times a day, seven days a week.  Follow-Up Plan: Patient will attend a 3 month follow-up visit for diabetes self-management education.

## 2012-03-31 ENCOUNTER — Other Ambulatory Visit: Payer: Medicare Other

## 2012-03-31 ENCOUNTER — Other Ambulatory Visit (INDEPENDENT_AMBULATORY_CARE_PROVIDER_SITE_OTHER): Payer: Medicare Other

## 2012-03-31 DIAGNOSIS — E785 Hyperlipidemia, unspecified: Secondary | ICD-10-CM | POA: Diagnosis not present

## 2012-03-31 DIAGNOSIS — I10 Essential (primary) hypertension: Secondary | ICD-10-CM | POA: Diagnosis not present

## 2012-03-31 LAB — LIPID PANEL
LDL Cholesterol: 51 mg/dL (ref 0–99)
VLDL: 26.4 mg/dL (ref 0.0–40.0)

## 2012-03-31 LAB — BASIC METABOLIC PANEL
BUN: 12 mg/dL (ref 6–23)
Chloride: 95 mEq/L — ABNORMAL LOW (ref 96–112)
Potassium: 3.8 mEq/L (ref 3.5–5.1)
Sodium: 138 mEq/L (ref 135–145)

## 2012-03-31 LAB — HEPATIC FUNCTION PANEL
Albumin: 4 g/dL (ref 3.5–5.2)
Bilirubin, Direct: 0.1 mg/dL (ref 0.0–0.3)
Total Protein: 7.5 g/dL (ref 6.0–8.3)

## 2012-04-05 ENCOUNTER — Encounter: Payer: Self-pay | Admitting: Family Medicine

## 2012-04-05 ENCOUNTER — Ambulatory Visit (INDEPENDENT_AMBULATORY_CARE_PROVIDER_SITE_OTHER): Payer: Medicare Other | Admitting: Family Medicine

## 2012-04-05 VITALS — BP 102/68 | Temp 98.0°F | Wt 282.0 lb

## 2012-04-05 DIAGNOSIS — E785 Hyperlipidemia, unspecified: Secondary | ICD-10-CM

## 2012-04-05 DIAGNOSIS — I10 Essential (primary) hypertension: Secondary | ICD-10-CM | POA: Diagnosis not present

## 2012-04-05 DIAGNOSIS — M47817 Spondylosis without myelopathy or radiculopathy, lumbosacral region: Secondary | ICD-10-CM | POA: Diagnosis not present

## 2012-04-05 DIAGNOSIS — E119 Type 2 diabetes mellitus without complications: Secondary | ICD-10-CM

## 2012-04-05 DIAGNOSIS — M47816 Spondylosis without myelopathy or radiculopathy, lumbar region: Secondary | ICD-10-CM

## 2012-04-05 MED ORDER — VALSARTAN 80 MG PO TABS: 80.0000 mg | ORAL_TABLET | Freq: Every day | ORAL | Status: DC

## 2012-04-05 MED ORDER — SIMVASTATIN 20 MG PO TABS: 20.0000 mg | ORAL_TABLET | Freq: Every day | ORAL | Status: DC

## 2012-04-05 NOTE — Progress Notes (Signed)
  Subjective:    Patient ID: Steve Charles, male    DOB: 23-Sep-1946, 66 y.o.   MRN: [id number]  HPI  Patient here to discuss several items.  Type 2 diabetes. Followed by endocrinology. Was having nocturnal hypoglycemia on75/25 insulin. This was discontinued. He was started on Bydurion but stopped taking this after only couple of injections. He has decreased appetite on this medication but no nausea. Plan is to discuss with endocrinologist. Last A1c from 8.2%.  Hyperlipidemia. Currently on Crestor with LDL well controlled. Recent lipids reviewed with patient.. Patient wishes to switch to simvastatin for cost issues. No recent chest pains. No myalgias. No prior intolerance to statins  Chronic cervical and lumbar back pain secondary to degenerative spondylosis. Patient is considering acupuncture treatment which about 10 years ago helped tremendously.  Past Medical History  Diagnosis Date  . DYSLIPIDEMIA 11/29/2009  . HYPERTENSION, ESSENTIAL 11/29/2009  . URTICARIA 02/06/2010  . BPH (benign prostatic hypertrophy)   . Osteoarthritis   . DM (diabetes mellitus)   . OSA (obstructive sleep apnea) 03/26/2011    CPAP 9 cm H2O  . COPD (chronic obstructive pulmonary disease) 05/01/2011    PFT 05/01/11>>FEV1 2.07(72%), FEV1% 65, TLC 6.79(111%), DLCO 69%, no BD  . CAD (coronary artery disease)    Past Surgical History  Procedure Date  . Appendectomy 1963  . Spine surgery     spinal fusion - L4, L5  . Total knee arthroplasty 2007  . Transurethral resection of prostate   . Nasal sinus surgery 1989    reports that he has been smoking Cigarettes.  He has a 50 pack-year smoking history. He has never used smokeless tobacco. He reports that he drinks alcohol. He reports that he does not use illicit drugs. family history includes Arthritis in his other; Cancer in his father; Diabetes in his other; and Prostate cancer in his brother. Allergies  Allergen Reactions  . Codeine Sulfate     REACTION: hives   . Morphine And Related     Oral morphine  . Propoxyphene-Acetaminophen     REACTION: hives  . Ramipril       Review of Systems  Constitutional: Negative for fatigue.  Eyes: Negative for visual disturbance.  Respiratory: Negative for cough, chest tightness and shortness of breath.   Cardiovascular: Negative for chest pain, palpitations and leg swelling.  Neurological: Negative for dizziness, syncope, weakness, light-headedness and headaches.       Objective:   Physical Exam  Constitutional: He is oriented to person, place, and time. He appears well-developed and well-nourished. No distress.  HENT:  Mouth/Throat: Oropharynx is clear and moist.  Neck: Neck supple. No thyromegaly present.  Cardiovascular: Normal rate and regular rhythm.   Pulmonary/Chest: Effort normal and breath sounds normal. No respiratory distress. He has no wheezes. He has no rales.  Musculoskeletal: He exhibits no edema.  Lymphadenopathy:    He has no cervical adenopathy.  Neurological: He is alert and oriented to person, place, and time.          Assessment & Plan:   #1 type 2 diabetes. Recent hypoglycemia on insulin. Patient to discuss regimens with endocrinologist soon  #2 dyslipidemia. Switch from Crestor to simvastatin 20 mg daily (for cost issues) and repeat lipids and hepatic in 3 months  #3 chronic cervical and lumbar spondylosis. Patient considering acupuncture. #4 hypertension. Well controlled. Patient requesting reduction in dosage of Diovan. He has consistently been below 120/70. Reduce Diovan to 80 mg once daily and reassess 3 months

## 2012-06-24 ENCOUNTER — Telehealth: Payer: Self-pay | Admitting: Cardiology

## 2012-06-24 NOTE — Telephone Encounter (Signed)
New msg Pt said his heart rate has been fast. He said it started yesterday. No chest pain no sob. Please call

## 2012-06-24 NOTE — Telephone Encounter (Signed)
Left message for pt to call.

## 2012-06-25 ENCOUNTER — Telehealth: Payer: Self-pay | Admitting: Family Medicine

## 2012-06-25 DIAGNOSIS — E119 Type 2 diabetes mellitus without complications: Secondary | ICD-10-CM

## 2012-06-25 NOTE — Telephone Encounter (Signed)
Pt would like to be tested for hep c as well when he comes in for labs next Friday 8/30

## 2012-06-28 NOTE — Telephone Encounter (Signed)
OK to add Hep C antibody testing.

## 2012-06-29 NOTE — Telephone Encounter (Signed)
Additional lab ordered, pt aware

## 2012-06-30 ENCOUNTER — Ambulatory Visit: Payer: Medicare Other | Admitting: Dietician

## 2012-06-30 NOTE — Telephone Encounter (Signed)
Spoke with pt, he did not return my call from last week. He states his heart rate calmed down and everything is back to normal. He will call back with further problems.

## 2012-07-01 ENCOUNTER — Encounter: Payer: Medicare Other | Attending: Endocrinology | Admitting: Dietician

## 2012-07-01 ENCOUNTER — Encounter: Payer: Self-pay | Admitting: Dietician

## 2012-07-01 NOTE — Progress Notes (Signed)
  Patient was seen on 07/01/2012 for their 3 month follow-up as a part of the diabetes self-management courses at the [facility]. The following learning objectives were met by your patient during this course:  Patient self reports the following:  Diabetes control has improved since diabetes self-management training: Reports that his glucose levels are improved and his only medication at this time is Metformin Number of days blood glucose is >200: None Last MD appointment for diabetes: June 2013 Changes in treatment plan: No current changes Confidence with ability to manage diabetes: Much more confident Areas for improvement with diabetes self-care: Wants to continue to exercise and to lose more weight.  Current weight loss at 271.5 with an 18.1 lb loss since starting the classes.  A1c was 8.6% and reported to be 6.2% at last MD visit. Willingness to participate in diabetes support group: Currently not interested, he is back working 23 hr/week.  Please see Diabetes Flow sheet for findings related to patient's self-care.  Follow-Up Plan: Patient is eligible for a "free" 30 minute diabetes self-care appointment in the next year. Patient to call and schedule as needed.

## 2012-07-02 ENCOUNTER — Other Ambulatory Visit (INDEPENDENT_AMBULATORY_CARE_PROVIDER_SITE_OTHER): Payer: Medicare Other

## 2012-07-02 DIAGNOSIS — E119 Type 2 diabetes mellitus without complications: Secondary | ICD-10-CM | POA: Diagnosis not present

## 2012-07-02 DIAGNOSIS — E785 Hyperlipidemia, unspecified: Secondary | ICD-10-CM

## 2012-07-02 LAB — LIPID PANEL
Cholesterol: 123 mg/dL (ref 0–200)
HDL: 32.1 mg/dL — ABNORMAL LOW (ref >39.00)
LDL Cholesterol: 62 mg/dL (ref 0–99)
Triglycerides: 145 mg/dL (ref 0.0–149.0)
VLDL: 29 mg/dL (ref 0.0–40.0)

## 2012-07-02 LAB — HEPATIC FUNCTION PANEL
Albumin: 3.9 g/dL (ref 3.5–5.2)
Total Protein: 7.3 g/dL (ref 6.0–8.3)

## 2012-07-07 ENCOUNTER — Ambulatory Visit (INDEPENDENT_AMBULATORY_CARE_PROVIDER_SITE_OTHER): Payer: Medicare Other | Admitting: Family Medicine

## 2012-07-07 DIAGNOSIS — I10 Essential (primary) hypertension: Secondary | ICD-10-CM | POA: Diagnosis not present

## 2012-07-07 DIAGNOSIS — E119 Type 2 diabetes mellitus without complications: Secondary | ICD-10-CM | POA: Diagnosis not present

## 2012-07-07 DIAGNOSIS — E785 Hyperlipidemia, unspecified: Secondary | ICD-10-CM | POA: Diagnosis not present

## 2012-07-07 DIAGNOSIS — H269 Unspecified cataract: Secondary | ICD-10-CM

## 2012-07-07 NOTE — Progress Notes (Signed)
  Subjective:    Patient ID: Steve Charles, male    DOB: 25-Aug-1946, 66 y.o.   MRN: [id number]  HPI  Medical followup. Problems include history of obesity, CAD, type 2 diabetes, prior history of TIA, hypertension hyperlipidemia, COPD with ongoing nicotine use, obstructive sleep apnea. Cataracts which are becoming more problematic. He is requesting referral ophthalmologist. Last eye exam about 8 months ago.  Type 2 diabetes improving. Patient  Has been to nutritionist and made lifestyle changes. Lost about 5 pounds since last visit. Last A1c per endocrinologist back in June 6.9%. Patient remains on simvastatin for hyperlipidemia. Recent lipids here at goal LDL 62. Still has low HDL. No recent chest pains. No increased dyspnea.  Past Medical History  Diagnosis Date  . DYSLIPIDEMIA 11/29/2009  . HYPERTENSION, ESSENTIAL 11/29/2009  . URTICARIA 02/06/2010  . BPH (benign prostatic hypertrophy)   . Osteoarthritis   . DM (diabetes mellitus)   . OSA (obstructive sleep apnea) 03/26/2011    CPAP 9 cm H2O  . COPD (chronic obstructive pulmonary disease) 05/01/2011    PFT 05/01/11>>FEV1 2.07(72%), FEV1% 65, TLC 6.79(111%), DLCO 69%, no BD  . CAD (coronary artery disease)    Past Surgical History  Procedure Date  . Appendectomy 1963  . Spine surgery     spinal fusion - L4, L5  . Total knee arthroplasty 2007  . Transurethral resection of prostate   . Nasal sinus surgery 1989    reports that he has been smoking Cigarettes.  He has a 50 pack-year smoking history. He has never used smokeless tobacco. He reports that he drinks alcohol. He reports that he does not use illicit drugs. family history includes Arthritis in his other; Cancer in his father; Diabetes in his other; and Prostate cancer in his brother. Allergies  Allergen Reactions  . Codeine Sulfate     REACTION: hives  . Morphine And Related     Oral morphine  . Propoxyphene-Acetaminophen     REACTION: hives  . Ramipril       Review  of Systems  Constitutional: Negative for fatigue.  Eyes: Negative for visual disturbance.  Respiratory: Negative for cough, chest tightness and shortness of breath.   Cardiovascular: Negative for chest pain, palpitations and leg swelling.  Neurological: Negative for dizziness, syncope, weakness, light-headedness and headaches.       Objective:   Physical Exam  Constitutional: He appears well-developed and well-nourished.  HENT:  Mouth/Throat: Oropharynx is clear and moist.  Eyes: Pupils are equal, round, and reactive to light.       Patient does have bilateral cataracts  Neck: Neck supple. No thyromegaly present.       No carotid bruits  Cardiovascular: Normal rate and regular rhythm.   Pulmonary/Chest: Effort normal and breath sounds normal. No respiratory distress. He has no wheezes. He has no rales.  Musculoskeletal: He exhibits no edema.       No foot lesions. Normal sensory function. Good distal pulses  Lymphadenopathy:    He has no cervical adenopathy.          Assessment & Plan:  #1 type 2 diabetes. Improving control per home readings. Recent dietary change. Followed by endocrinology. #2 bilateral cataracts. Ophthalmology referral  #3 hyperlipidemia. Recent lipids at goal. Continue simvastatin 20 mg daily  #4 hypertension adequately controlled

## 2012-07-15 ENCOUNTER — Ambulatory Visit: Payer: Medicare Other | Admitting: Pulmonary Disease

## 2012-07-27 ENCOUNTER — Telehealth: Payer: Self-pay | Admitting: Pulmonary Disease

## 2012-07-27 NOTE — Telephone Encounter (Signed)
Pt states the we should expect a form to be sent regarding a new oral device/mouth piece. He was not sure of the company name. I will forward this to Mindy and Dr. Craige Cotta so they are aware.

## 2012-07-28 NOTE — Telephone Encounter (Signed)
Per VS pt will need OV to discuss this with him. I will hold onto form. I spoke with pt and advised him of this. He is scheduled to see VS on 08/19/12 at 2:45.

## 2012-07-28 NOTE — Telephone Encounter (Signed)
At the bottom of form it states what is needed. Will follow up with VS regarding this.

## 2012-07-28 NOTE — Telephone Encounter (Signed)
Received form form [employer]. Form placed in VS look out. Will forward to Dr. Craige Cotta so he is aware

## 2012-07-28 NOTE — Telephone Encounter (Signed)
There was only copy form for authorization to request and disclose health information form.  Is there any other paperwork available for this?

## 2012-07-29 ENCOUNTER — Telehealth: Payer: Self-pay | Admitting: Pulmonary Disease

## 2012-07-29 NOTE — Telephone Encounter (Signed)
Pt is aware this is going to be taking care of at pt OV 08/19/12. I have CMN for when pt comes in for this. i called and made alissa aware of this

## 2012-08-05 DIAGNOSIS — H2510 Age-related nuclear cataract, unspecified eye: Secondary | ICD-10-CM | POA: Diagnosis not present

## 2012-08-05 DIAGNOSIS — H25049 Posterior subcapsular polar age-related cataract, unspecified eye: Secondary | ICD-10-CM | POA: Diagnosis not present

## 2012-08-05 DIAGNOSIS — E119 Type 2 diabetes mellitus without complications: Secondary | ICD-10-CM | POA: Diagnosis not present

## 2012-08-05 LAB — HM DIABETES EYE EXAM

## 2012-08-09 ENCOUNTER — Encounter: Payer: Self-pay | Admitting: Family Medicine

## 2012-08-19 ENCOUNTER — Encounter: Payer: Self-pay | Admitting: Pulmonary Disease

## 2012-08-19 ENCOUNTER — Ambulatory Visit (INDEPENDENT_AMBULATORY_CARE_PROVIDER_SITE_OTHER): Payer: Medicare Other | Admitting: Pulmonary Disease

## 2012-08-19 VITALS — BP 106/62 | HR 103 | Temp 98.9°F | Ht 69.0 in | Wt 266.0 lb

## 2012-08-19 DIAGNOSIS — G4733 Obstructive sleep apnea (adult) (pediatric): Secondary | ICD-10-CM

## 2012-08-19 DIAGNOSIS — Z23 Encounter for immunization: Secondary | ICD-10-CM

## 2012-08-19 DIAGNOSIS — J439 Emphysema, unspecified: Secondary | ICD-10-CM

## 2012-08-19 DIAGNOSIS — J438 Other emphysema: Secondary | ICD-10-CM

## 2012-08-19 NOTE — Assessment & Plan Note (Signed)
Stable off inhalers

## 2012-08-19 NOTE — Progress Notes (Signed)
Chief Complaint  Patient presents with  . Follow-up    Wears CPAP nightly x 6 hrs; no problems with mask or pressure. Pt satisfied with ramp. patient would like flu vax   CC: [name]   History of Present Illness: Steve Charles is a 66 y.o. male former smoker with COPD and OSA using auto CPAP.  His breathing is doing okay.  He does not have cough, wheeze, or sputum.  He is exercising more and has been losing weight.  He uses his auto CPAP every night and feels this helps his sleep.  Tests: PSG 03/26/11>>AHI 34.5, SpO2 low 78%.  CPAP 9 cm>>AHI 8, +REM/supine PFT 05/01/11>>FEV1 2.07(72%), FEV1% 65, TLC 6.79(111%), RV 3.42(146%), DLCO 69%, no BD CPAP 10/16/11 to 12/04/11>>Used on 50 of 50 nights with average 4 hrs 12 min.  Average AHI 3.8 with CPAP 12 cm H2O.  Past Medical History  Diagnosis Date  . DYSLIPIDEMIA 11/29/2009  . HYPERTENSION, ESSENTIAL 11/29/2009  . URTICARIA 02/06/2010  . BPH (benign prostatic hypertrophy)   . Osteoarthritis   . DM (diabetes mellitus)   . OSA (obstructive sleep apnea) 03/26/2011    CPAP 9 cm H2O  . COPD (chronic obstructive pulmonary disease) 05/01/2011    PFT 05/01/11>>FEV1 2.07(72%), FEV1% 65, TLC 6.79(111%), DLCO 69%, no BD  . CAD (coronary artery disease)     Past Surgical History  Procedure Date  . Appendectomy 1963  . Spine surgery     spinal fusion - L4, L5  . Total knee arthroplasty 2007  . Transurethral resection of prostate   . Nasal sinus surgery 1989    Allergies  Allergen Reactions  . Codeine Sulfate     REACTION: hives  . Morphine And Related     Oral morphine  . Propoxyphene-Acetaminophen     REACTION: hives  . Ramipril     Physical Exam:  Blood pressure 106/62, pulse 103, temperature 98.9 F (37.2 C), temperature source Oral, height 5\' 9"  (1.753 m), weight 266 lb (120.657 kg), SpO2 95.00%.  Body mass index is 39.28 kg/(m^2).  Wt Readings from Last 2 Encounters:  08/19/12 266 lb (120.657 kg)  07/01/12 271  lb 8 oz (123.152 kg)   General - Obese  HEENT - Wears glasses, PERRLA, EOMI, no sinus tenderness, MP 3, no oral exudate, no LAN, no thyromegaly  Cardiac - s1s2, no murmur, pulses symmetric  Chest - diminished breath sounds, no wheezing or rales  Abd - obese, soft, non-tender  Ext - no e/c/c  Neuro - normal strength, CN intact, A&O x 3  Psych - normal mood/behavior  Skin - no rashes  Assessment/Plan:  Outpatient Encounter Prescriptions as of 08/19/2012  Medication Sig Dispense Refill  . amoxicillin (AMOXIL) 500 MG capsule Take 500 mg by mouth. 4 tabs prior to Dental procedures      . aspirin 81 MG tablet Take 81 mg by mouth daily.        Marland Kitchen EPIPEN 2-PAK 0.3 MG/0.3ML DEVI Use as directed      . furosemide (LASIX) 40 MG tablet Take 40 mg by mouth daily as needed.        . INVOKANA 100 MG TABS Take 1 tablet by mouth Daily.      . metFORMIN (GLUCOPHAGE) 500 MG tablet Take by mouth. 2 pills twice daily       . Multiple Vitamin (MULTIVITAMIN) tablet Take 1 tablet by mouth 2 (two) times daily.        . simvastatin (ZOCOR) 20  MG tablet Take 1 tablet (20 mg total) by mouth at bedtime.  90 tablet  3  . valsartan (DIOVAN) 80 MG tablet Take 1 tablet (80 mg total) by mouth daily.  90 tablet  3    Tanaisha Pittman Pager:  [phone] 08/19/2012, 3:02 PM

## 2012-08-19 NOTE — Assessment & Plan Note (Signed)
He is doing well with CPAP.  Discussed the option of oral appliance.  After discussion he has decided to stick with CPAP and continue to work on his weight.

## 2012-08-19 NOTE — Patient Instructions (Signed)
Flu shot today Follow up in 1 year 

## 2012-09-04 DIAGNOSIS — H2510 Age-related nuclear cataract, unspecified eye: Secondary | ICD-10-CM | POA: Diagnosis not present

## 2012-09-06 DIAGNOSIS — H2510 Age-related nuclear cataract, unspecified eye: Secondary | ICD-10-CM | POA: Diagnosis not present

## 2012-09-06 DIAGNOSIS — H25049 Posterior subcapsular polar age-related cataract, unspecified eye: Secondary | ICD-10-CM | POA: Diagnosis not present

## 2012-09-07 DIAGNOSIS — Y839 Surgical procedure, unspecified as the cause of abnormal reaction of the patient, or of later complication, without mention of misadventure at the time of the procedure: Secondary | ICD-10-CM | POA: Diagnosis not present

## 2012-09-07 DIAGNOSIS — Z961 Presence of intraocular lens: Secondary | ICD-10-CM | POA: Diagnosis not present

## 2012-09-07 DIAGNOSIS — H59029 Cataract (lens) fragments in eye following cataract surgery, unspecified eye: Secondary | ICD-10-CM | POA: Diagnosis not present

## 2012-09-07 DIAGNOSIS — Z9849 Cataract extraction status, unspecified eye: Secondary | ICD-10-CM | POA: Diagnosis not present

## 2012-09-14 DIAGNOSIS — S83289A Other tear of lateral meniscus, current injury, unspecified knee, initial encounter: Secondary | ICD-10-CM | POA: Diagnosis not present

## 2012-09-16 DIAGNOSIS — M25569 Pain in unspecified knee: Secondary | ICD-10-CM | POA: Diagnosis not present

## 2012-09-16 DIAGNOSIS — S8000XA Contusion of unspecified knee, initial encounter: Secondary | ICD-10-CM | POA: Diagnosis not present

## 2012-09-27 DIAGNOSIS — H2510 Age-related nuclear cataract, unspecified eye: Secondary | ICD-10-CM | POA: Diagnosis not present

## 2012-09-27 DIAGNOSIS — H25049 Posterior subcapsular polar age-related cataract, unspecified eye: Secondary | ICD-10-CM | POA: Diagnosis not present

## 2012-10-07 ENCOUNTER — Encounter: Payer: Self-pay | Admitting: Family Medicine

## 2012-10-07 ENCOUNTER — Ambulatory Visit (INDEPENDENT_AMBULATORY_CARE_PROVIDER_SITE_OTHER): Payer: Medicare Other | Admitting: Family Medicine

## 2012-10-07 VITALS — BP 108/58 | Temp 98.9°F | Wt 258.0 lb

## 2012-10-07 DIAGNOSIS — R1310 Dysphagia, unspecified: Secondary | ICD-10-CM | POA: Diagnosis not present

## 2012-10-07 DIAGNOSIS — E119 Type 2 diabetes mellitus without complications: Secondary | ICD-10-CM

## 2012-10-07 DIAGNOSIS — I10 Essential (primary) hypertension: Secondary | ICD-10-CM

## 2012-10-07 NOTE — Progress Notes (Signed)
Subjective:     Patient ID: Steve Charles, male   DOB: [birth year], 66 y.o.   MRN: [id number]  HPI Patient here for medical follow-up.  Problems include history of obesity, CAD, type 2 diabetes, prior history of TIA, hypertension, hyperlipidemia, COPD with ongoing nicotine use, obstructive sleep apnea.  Patient had cataracts removed last month with Dr. Dione Booze, no complications.  Patient states that he watches his diet and limits fat and carbohydrates, in addition to taking metformin 500mg  BID, in an effort to control his diabetes.  He is followed by Dr. Lisabeth Devoid in endocrinology for this, and reports that his most recent A1C was 7.2% several months ago.  He notes that he has been experiencing what he describes as difficulty swallowing over the last 6 months, initially intermittent, but "several dozen" times per week for the last 2-3 months.  He describes this as food "going down the wrong pipe," causing him to cough.  Denies that it feels like food is getting stuck in his esophagus, but more difficulty swallowing.  He denies that foods of any particular consistency initiate this, stating that both liquids and solids can cause it.  He denies nausea or vomiting, as well as reflux symptoms.    Review of Systems  HENT: Positive for trouble swallowing (per HPI). Negative for voice change.   Respiratory: Positive for cough and choking (with difficulty swallowing per HPI). Negative for chest tightness and shortness of breath.   Cardiovascular: Negative for chest pain.       Objective:   Physical Exam  Constitutional: He is oriented to person, place, and time. He appears well-developed and well-nourished.  HENT:  Head: Normocephalic and atraumatic.  Neck: Normal range of motion. Neck supple. No thyromegaly present.  Cardiovascular: Normal rate, regular rhythm and normal heart sounds.   Pulmonary/Chest: Effort normal and breath sounds normal. No respiratory distress.  Lymphadenopathy:    He has no  cervical adenopathy.  Neurological: He is alert and oriented to person, place, and time.       Assessment:     66 year old here for medical follow up.    Plan:     1. Type 2 diabetes: followed by endocrinology with Dr. Talmage Nap.  Per pt, last A1C 7.2%.  Patient has made diet changes and continues to lose weight from these efforts. 2. Swallowing difficulty: based on patient's description of symptoms, sounds more oropharyngeal than esophageal, but difficult to determine without further assessment.  Refer for swallowing study with speech pathology and will re-evaluate when those results are in. 3. Hypertension: stable and well-controlled.  Continue furosemide and diovan. 4. Hyperlipidemia: lipids in August at goal, LDL 62.  Continue simvastatin 20mg  daily.  Marthann Schiller, MS3     Agree with assessment and plan as per Marthann Schiller, MS 3 Evelena Peat MD

## 2012-10-07 NOTE — Patient Instructions (Signed)
We will follow up with you about scheduling a swallowing study.  Please call if you do not hear in the next week to 10 days.

## 2012-10-14 DIAGNOSIS — E7800 Pure hypercholesterolemia, unspecified: Secondary | ICD-10-CM | POA: Diagnosis not present

## 2012-10-14 DIAGNOSIS — I10 Essential (primary) hypertension: Secondary | ICD-10-CM | POA: Diagnosis not present

## 2012-10-14 DIAGNOSIS — R609 Edema, unspecified: Secondary | ICD-10-CM | POA: Diagnosis not present

## 2012-10-21 ENCOUNTER — Ambulatory Visit (HOSPITAL_COMMUNITY)
Admission: RE | Admit: 2012-10-21 | Discharge: 2012-10-21 | Disposition: A | Discharge status: Home / Self Care | Payer: Medicare Other | Source: Ambulatory Visit | Attending: Family Medicine | Admitting: Family Medicine

## 2012-10-21 DIAGNOSIS — I2510 Atherosclerotic heart disease of native coronary artery without angina pectoris: Secondary | ICD-10-CM | POA: Diagnosis not present

## 2012-10-21 DIAGNOSIS — R1313 Dysphagia, pharyngeal phase: Secondary | ICD-10-CM | POA: Diagnosis not present

## 2012-10-21 DIAGNOSIS — E119 Type 2 diabetes mellitus without complications: Secondary | ICD-10-CM | POA: Diagnosis not present

## 2012-10-21 DIAGNOSIS — J4489 Other specified chronic obstructive pulmonary disease: Secondary | ICD-10-CM | POA: Insufficient documentation

## 2012-10-21 DIAGNOSIS — J449 Chronic obstructive pulmonary disease, unspecified: Secondary | ICD-10-CM | POA: Diagnosis not present

## 2012-10-21 DIAGNOSIS — R1310 Dysphagia, unspecified: Secondary | ICD-10-CM | POA: Insufficient documentation

## 2012-10-21 DIAGNOSIS — G4733 Obstructive sleep apnea (adult) (pediatric): Secondary | ICD-10-CM | POA: Diagnosis not present

## 2012-10-21 DIAGNOSIS — I10 Essential (primary) hypertension: Secondary | ICD-10-CM | POA: Insufficient documentation

## 2012-10-21 DIAGNOSIS — E785 Hyperlipidemia, unspecified: Secondary | ICD-10-CM | POA: Insufficient documentation

## 2012-10-21 NOTE — Procedures (Signed)
Objective Swallowing Evaluation: Modified Barium Swallowing Study  Patient Details  Name: Steve Charles MRN: [id number] Date of Birth: 31-Mar-1946  Today's Date: 10/21/2012 Time: 1000-1025 SLP Time Calculation (min): 25 min  Past Medical History:  Past Medical History  Diagnosis Date  . DYSLIPIDEMIA 11/29/2009  . HYPERTENSION, ESSENTIAL 11/29/2009  . URTICARIA 02/06/2010  . BPH (benign prostatic hypertrophy)   . Osteoarthritis   . DM (diabetes mellitus)   . OSA (obstructive sleep apnea) 03/26/2011    CPAP 9 cm H2O  . COPD (chronic obstructive pulmonary disease) 05/01/2011    PFT 05/01/11>>FEV1 2.07(72%), FEV1% 65, TLC 6.79(111%), DLCO 69%, no BD  . CAD (coronary artery disease)    Past Surgical History:  Past Surgical History  Procedure Date  . Appendectomy 1963  . Spine surgery     spinal fusion - L4, L5  . Total knee arthroplasty 2007  . Transurethral resection of prostate   . Nasal sinus surgery 19831   HPI:  66 year old male seen for outpatient MBS due to recent c/o coughing during both liquid and solid intake, and globus.  PMH of diabetes, sleep apnea.      Assessment / Plan / Recommendation Clinical Impression  Dysphagia Diagnosis: Mild pharyngeal phase dysphagia Clinical impression: Patient presents with suspected mild anatomical based dysphagia. Oropharyngeal swallow appears [name] for all consistencies tested however pill noted to remain in vallecula  post swallow, requiring pureed solid to clear, suspected due to small boney protrusion of cervical spine (? osteophyte however MD not present to confirm) which may be slightly impeding full epiglottic deflection. Full airway protection noted. Recommend continuation of a regular diet with general safe swallowing precautions.     Treatment Recommendation  No treatment recommended at this time    Diet Recommendation Regular;Thin liquid   Liquid Administration via: Cup;Straw Medication Administration: Whole meds with  liquid (plenty of liquid or bite of pureed solid to clear vallecula) Supervision: Patient able to self feed Compensations: Slow rate;Small sips/bites Postural Changes and/or Swallow Maneuvers: Seated upright 90 degrees    Other  Recommendations Oral Care Recommendations: Oral care BID   Follow Up Recommendations  None            General HPI: 66 year old male seen for outpatient MBS due to recent c/o coughing during both liquid and solid intake, and globus.  PMH of diabetes, sleep apnea.  Type of Study: Modified Barium Swallowing Study Reason for Referral: Objectively evaluate swallowing function Previous Swallow Assessment: none Diet Prior to this Study: Regular;Thin liquids Temperature Spikes Noted: No Respiratory Status: Room air History of Recent Intubation: No Behavior/Cognition: Alert;Cooperative;Pleasant mood Oral Cavity - Dentition: Adequate natural dentition Oral Motor / Sensory Function: Within functional limits Self-Feeding Abilities: Able to feed self Patient Positioning: Upright in chair Baseline Vocal Quality: Clear Volitional Cough: Strong Volitional Swallow: Able to elicit Anatomy:  (? of small osteophyte located around C3/4) Pharyngeal Secretions: Not observed secondary MBS    Reason for Referral Objectively evaluate swallowing function   Oral Phase Oral Preparation/Oral Phase Oral Phase: WFL   Pharyngeal Phase Pharyngeal Phase Pharyngeal Phase: Impaired Pharyngeal - Thin Pharyngeal - Thin Cup: Within functional limits Pharyngeal - Thin Straw: Within functional limits Pharyngeal - Solids Pharyngeal - Puree: Pharyngeal residue - valleculae (trace) Pharyngeal - Regular: Pharyngeal residue - valleculae (trace) Pharyngeal - Pill: Pharyngeal residue - valleculae (pill remained in vallecula before clearing with bite of pure)  Cervical Esophageal Phase    GO    Cervical Esophageal  Phase Cervical Esophageal Phase: [name]    Functional Assessment Tool  Used: skilled clinical judgement Functional Limitations: Swallowing Swallow Current Status (W0981): At least 1 percent but less than 20 percent impaired, limited or restricted Swallow Goal Status [phone]): At least 1 percent but less than 20 percent impaired, limited or restricted Swallow Discharge Status [phone]): At least 1 percent but less than 20 percent impaired, limited or restricted   [name] MA, CCC-SLP [phone]  Steve Charles 10/21/2012, 1:17 PM

## 2012-11-02 ENCOUNTER — Other Ambulatory Visit: Payer: Self-pay

## 2012-11-02 MED ORDER — VALSARTAN 80 MG PO TABS: 80.0000 mg | ORAL_TABLET | Freq: Every day | ORAL | Status: DC

## 2012-11-09 ENCOUNTER — Other Ambulatory Visit: Payer: Self-pay | Admitting: *Deleted

## 2012-11-09 MED ORDER — SIMVASTATIN 20 MG PO TABS: 20.0000 mg | ORAL_TABLET | Freq: Every day | ORAL | Status: DC

## 2012-11-09 MED ORDER — VALSARTAN 80 MG PO TABS: 80.0000 mg | ORAL_TABLET | Freq: Every day | ORAL | Status: DC

## 2012-11-11 DIAGNOSIS — H0230 Blepharochalasis unspecified eye, unspecified eyelid: Secondary | ICD-10-CM | POA: Diagnosis not present

## 2012-11-18 ENCOUNTER — Other Ambulatory Visit: Payer: Self-pay | Admitting: *Deleted

## 2012-11-18 MED ORDER — VALSARTAN 80 MG PO TABS: 80.0000 mg | ORAL_TABLET | Freq: Every day | ORAL | Status: DC

## 2012-11-23 ENCOUNTER — Telehealth: Payer: Self-pay | Admitting: Family Medicine

## 2012-11-23 DIAGNOSIS — I10 Essential (primary) hypertension: Secondary | ICD-10-CM

## 2012-11-23 NOTE — Telephone Encounter (Signed)
Patient is still unable to get his Diovan. Per pt, [facility] will not rx it because they say there is a possible drug interaction between Diovan & Invokana. Pt is now out of Diovan, and they will not fill it until they speak w/Nancy about interaction. Please call [facility] at [phone]. Use Reference# [id number]. Please call today, as pt is out of meds.

## 2012-11-23 NOTE — Telephone Encounter (Signed)
Spoke with [facility].  They will refill his Diovan.  To be safe, let's have him come in for BMP (rule out hyperkalemia).

## 2012-11-23 NOTE — Telephone Encounter (Signed)
Please advise before I call [facility]

## 2012-11-30 ENCOUNTER — Telehealth: Payer: Self-pay | Admitting: Family Medicine

## 2012-11-30 DIAGNOSIS — M542 Cervicalgia: Secondary | ICD-10-CM | POA: Diagnosis not present

## 2012-11-30 DIAGNOSIS — M25559 Pain in unspecified hip: Secondary | ICD-10-CM | POA: Diagnosis not present

## 2012-11-30 DIAGNOSIS — S61209A Unspecified open wound of unspecified finger without damage to nail, initial encounter: Secondary | ICD-10-CM | POA: Diagnosis not present

## 2012-11-30 DIAGNOSIS — M25549 Pain in joints of unspecified hand: Secondary | ICD-10-CM | POA: Diagnosis not present

## 2012-11-30 NOTE — Telephone Encounter (Signed)
Pt called to follow up on script.

## 2012-11-30 NOTE — Telephone Encounter (Signed)
D/c Diovan and start Losartan 50 mg daily.

## 2012-11-30 NOTE — Telephone Encounter (Signed)
Insurance denied the Diovan. Pt must try losartan first. Please advise.

## 2012-12-01 MED ORDER — LOSARTAN POTASSIUM 50 MG PO TABS: 50.0000 mg | ORAL_TABLET | Freq: Every day | ORAL | Status: DC

## 2012-12-01 NOTE — Telephone Encounter (Signed)
Pt requested new Rx be sent to [facility]

## 2012-12-03 DIAGNOSIS — M47812 Spondylosis without myelopathy or radiculopathy, cervical region: Secondary | ICD-10-CM | POA: Diagnosis not present

## 2012-12-07 DIAGNOSIS — M47812 Spondylosis without myelopathy or radiculopathy, cervical region: Secondary | ICD-10-CM | POA: Diagnosis not present

## 2012-12-28 DIAGNOSIS — M542 Cervicalgia: Secondary | ICD-10-CM | POA: Diagnosis not present

## 2013-01-05 ENCOUNTER — Telehealth: Payer: Self-pay | Admitting: Family Medicine

## 2013-01-05 DIAGNOSIS — Z0000 Encounter for general adult medical examination without abnormal findings: Secondary | ICD-10-CM

## 2013-01-05 NOTE — Telephone Encounter (Signed)
If he has Medicare part A and B we cannot get SCREENING labs prior to appt.  Does he has supplemental to cover screening labs?

## 2013-01-05 NOTE — Telephone Encounter (Signed)
Pt would like go to [place] for his CPX labs. Can you please put in computer? His cpx is next Thurs, but I think he wants to go to [place] in the AM (tomorrow )  Thank yiou!

## 2013-01-05 NOTE — Telephone Encounter (Signed)
Pt has medicare.

## 2013-01-05 NOTE — Telephone Encounter (Signed)
Pt informed and he plans to come to [facility] at 3 pm and have labs ordered and return in following AM for fasting labs

## 2013-01-05 NOTE — Telephone Encounter (Signed)
Pt does have supplemental BC/BS.  He wants me to let him know when/if the labs are ordered.

## 2013-01-05 NOTE — Telephone Encounter (Signed)
He will have to sign waver if gets ahead. Would consider the following labs: CBC, BMP, Hepatic, Lipid, A1C, TSH, PSA

## 2013-01-13 ENCOUNTER — Ambulatory Visit (INDEPENDENT_AMBULATORY_CARE_PROVIDER_SITE_OTHER): Payer: Medicare Other | Admitting: Family Medicine

## 2013-01-13 ENCOUNTER — Encounter: Payer: Self-pay | Admitting: Family Medicine

## 2013-01-13 VITALS — BP 100/50 | HR 72 | Temp 98.5°F | Resp 12 | Ht 67.0 in | Wt 261.0 lb

## 2013-01-13 DIAGNOSIS — E785 Hyperlipidemia, unspecified: Secondary | ICD-10-CM | POA: Diagnosis not present

## 2013-01-13 DIAGNOSIS — Z0000 Encounter for general adult medical examination without abnormal findings: Secondary | ICD-10-CM

## 2013-01-13 DIAGNOSIS — N529 Male erectile dysfunction, unspecified: Secondary | ICD-10-CM | POA: Insufficient documentation

## 2013-01-13 DIAGNOSIS — I10 Essential (primary) hypertension: Secondary | ICD-10-CM

## 2013-01-13 DIAGNOSIS — E119 Type 2 diabetes mellitus without complications: Secondary | ICD-10-CM | POA: Diagnosis not present

## 2013-01-13 MED ORDER — SILDENAFIL CITRATE 100 MG PO TABS: 50.0000 mg | ORAL_TABLET | Freq: Every day | ORAL | Status: DC | PRN

## 2013-01-13 MED ORDER — DESOXIMETASONE 0.25 % EX CREA: TOPICAL_CREAM | Freq: Two times a day (BID) | CUTANEOUS | Status: DC

## 2013-01-13 NOTE — Progress Notes (Signed)
Subjective:    Patient ID: Steve Charles, male    DOB: Aug 02, 1946, 67 y.o.   MRN: [id number]  HPI Patient here for well visit and medical followup  Chronic problems include history of type 2 diabetes, CAD, history of TIA, hypertension, hyperlipidemia, COPD, ongoing nicotine use, BPH with prior TURP, obstructive sleep apnea, and osteoarthritis  Generally doing very well. Has followup with endocrinologist regarding diabetes next week. Medications reviewed. Recently started on Invokana.  Blood sugars have been stable. Very compliant with some positive dietary changes.    New problem of erectile dysfunction. He does not take nitroglycerin. Requesting medication options. Multiple risk factors as above. Good libido.  Frequent pruritic rash right fifth toe. Intermittent small vesicles. Mostly nonscaly. His tried several antifungals without improvement  Past Medical History  Diagnosis Date  . DYSLIPIDEMIA 11/29/2009  . HYPERTENSION, ESSENTIAL 11/29/2009  . URTICARIA 02/06/2010  . BPH (benign prostatic hypertrophy)   . Osteoarthritis   . DM (diabetes mellitus)   . OSA (obstructive sleep apnea) 03/26/2011    CPAP 9 cm H2O  . COPD (chronic obstructive pulmonary disease) 05/01/2011    PFT 05/01/11>>FEV1 2.07(72%), FEV1% 65, TLC 6.79(111%), DLCO 69%, no BD  . CAD (coronary artery disease)    Past Surgical History  Procedure Laterality Date  . Appendectomy  1963  . Spine surgery      spinal fusion - L4, L5  . Total knee arthroplasty  2007  . Transurethral resection of prostate      bph  . Nasal sinus surgery  1989    reports that he has been smoking Cigarettes.  He has a 50 pack-year smoking history. He has never used smokeless tobacco. He reports that  drinks alcohol. He reports that he does not use illicit drugs. family history includes Arthritis in his other; Cancer in his father; Diabetes in his other; and Prostate cancer in his brother. Allergies  Allergen Reactions  . Codeine  Sulfate     REACTION: hives  . Morphine And Related     Oral morphine  . Propoxyphene-Acetaminophen     REACTION: hives  . Ramipril    1.  Risk factors based on Past Medical , Social, and Family history reviewed as above 2.  Limitations in physical activities low risk for fall 3.  Depression/mood mood stable 4.  Hearing no major deficits 5.  ADLs fully independent in all 6.  Cognitive function (orientation to time and place, language, writing, speech,memory) no memory impairment. Judgment and language intact 7.  Home Safety no issues or concerns 8.  Height, weight, and visual acuity. Height and weight stable. Receiving yearly eye exams 9.  Counseling recommend smoking cessation 10. Recommendation of preventive services. Immunizations up to date. Colonoscopy up to date. 11. Labs based on risk factors basic metabolic panel, hepatic panel, and lipid panel 12. Care Plan as above      Review of Systems  Constitutional: Negative for fever, activity change, appetite change, fatigue and unexpected weight change.  HENT: Negative for ear pain, congestion and trouble swallowing.   Eyes: Negative for pain and visual disturbance.  Respiratory: Negative for cough, shortness of breath and wheezing.   Cardiovascular: Negative for chest pain and palpitations.  Gastrointestinal: Negative for nausea, vomiting, abdominal pain, diarrhea, constipation, blood in stool, abdominal distention and rectal pain.  Endocrine: Negative for cold intolerance, heat intolerance, polydipsia and polyuria.  Genitourinary: Negative for dysuria, hematuria and testicular pain.  Musculoskeletal: Negative for joint swelling and arthralgias.  Skin: Negative  for rash.  Neurological: Negative for dizziness, syncope and headaches.  Hematological: Negative for adenopathy.  Psychiatric/Behavioral: Negative for confusion and dysphoric mood.       Objective:   Physical Exam  Constitutional: He is oriented to person, place,  and time. He appears well-developed and well-nourished. No distress.  HENT:  Head: Normocephalic and atraumatic.  Right Ear: External ear normal.  Left Ear: External ear normal.  Mouth/Throat: Oropharynx is clear and moist.  Eyes: Conjunctivae and EOM are normal. Pupils are equal, round, and reactive to light.  Neck: Normal range of motion. Neck supple. No thyromegaly present.  Cardiovascular: Normal rate, regular rhythm and normal heart sounds.   No murmur heard. Pulmonary/Chest: No respiratory distress. He has no wheezes. He has no rales.  Abdominal: Soft. Bowel sounds are normal. He exhibits no distension and no mass. There is no tenderness. There is no rebound and no guarding.  Musculoskeletal: He exhibits no edema.  Lymphadenopathy:    He has no cervical adenopathy.  Neurological: He is alert and oriented to person, place, and time. He displays normal reflexes. No cranial nerve deficit.  Skin:  He has some mild hyperpigmentation lower legs consistent with venous stasis  Feet reveal no skin lesions. Good distal foot pulses. Good capillary refill. No calluses. Normal sensation with monofilament testing Lateral aspect right fifth toe reveals question dyshidrotic eczema. Couple small vesicles. No pustules. Nontender. No warmth. No ulcers.   Psychiatric: He has a normal mood and affect. His behavior is normal. Judgment and thought content normal.          Assessment & Plan  Health maintenance. Immunizations up-to-date. Colonoscopy up to date. Smoking cessation discussed  Erectile dysfunction. Trial of Viagra 50-100 mg daily as needed.  No contraindications.  Hyperlipidemia. Check lipid and hepatic panel  Hypertension well controlled. Check basic metabolic panel  Probable dyshidrotic eczema right foot. Has already tried antifungal creams without improvement. Topicort 0.25% cream twice daily -no longer than 2 weeks continuous use.

## 2013-01-14 ENCOUNTER — Encounter: Payer: Self-pay | Admitting: Family Medicine

## 2013-01-14 ENCOUNTER — Other Ambulatory Visit (INDEPENDENT_AMBULATORY_CARE_PROVIDER_SITE_OTHER): Payer: Medicare Other

## 2013-01-14 ENCOUNTER — Ambulatory Visit: Payer: Medicare Other | Admitting: Family Medicine

## 2013-01-14 DIAGNOSIS — E785 Hyperlipidemia, unspecified: Secondary | ICD-10-CM

## 2013-01-14 LAB — HEPATIC FUNCTION PANEL
ALT: 24 U/L (ref 0–53)
AST: 17 U/L (ref 0–37)
Bilirubin, Direct: 0.2 mg/dL (ref 0.0–0.3)
Total Bilirubin: 0.8 mg/dL (ref 0.3–1.2)

## 2013-01-14 LAB — BASIC METABOLIC PANEL
BUN: 12 mg/dL (ref 6–23)
Calcium: 9.4 mg/dL (ref 8.4–10.5)
GFR: 99.32 mL/min (ref >60.00)
Glucose, Bld: 198 mg/dL — ABNORMAL HIGH (ref 70–99)
Sodium: 138 mEq/L (ref 135–145)

## 2013-01-14 LAB — LIPID PANEL: HDL: 32.7 mg/dL — ABNORMAL LOW (ref >39.00)

## 2013-01-21 DIAGNOSIS — R609 Edema, unspecified: Secondary | ICD-10-CM | POA: Diagnosis not present

## 2013-01-21 DIAGNOSIS — I10 Essential (primary) hypertension: Secondary | ICD-10-CM | POA: Diagnosis not present

## 2013-01-21 DIAGNOSIS — E7800 Pure hypercholesterolemia, unspecified: Secondary | ICD-10-CM | POA: Diagnosis not present

## 2013-02-01 DIAGNOSIS — Z961 Presence of intraocular lens: Secondary | ICD-10-CM | POA: Diagnosis not present

## 2013-02-01 DIAGNOSIS — H0230 Blepharochalasis unspecified eye, unspecified eyelid: Secondary | ICD-10-CM | POA: Diagnosis not present

## 2013-02-01 DIAGNOSIS — Z9849 Cataract extraction status, unspecified eye: Secondary | ICD-10-CM | POA: Diagnosis not present

## 2013-02-01 DIAGNOSIS — H02409 Unspecified ptosis of unspecified eyelid: Secondary | ICD-10-CM | POA: Insufficient documentation

## 2013-02-01 DIAGNOSIS — F17200 Nicotine dependence, unspecified, uncomplicated: Secondary | ICD-10-CM | POA: Diagnosis not present

## 2013-02-05 DIAGNOSIS — M19039 Primary osteoarthritis, unspecified wrist: Secondary | ICD-10-CM | POA: Diagnosis not present

## 2013-03-22 DIAGNOSIS — G4730 Sleep apnea, unspecified: Secondary | ICD-10-CM | POA: Insufficient documentation

## 2013-03-22 DIAGNOSIS — I10 Essential (primary) hypertension: Secondary | ICD-10-CM | POA: Diagnosis not present

## 2013-04-01 DIAGNOSIS — Z9849 Cataract extraction status, unspecified eye: Secondary | ICD-10-CM | POA: Diagnosis not present

## 2013-04-01 DIAGNOSIS — E669 Obesity, unspecified: Secondary | ICD-10-CM | POA: Diagnosis not present

## 2013-04-01 DIAGNOSIS — H02409 Unspecified ptosis of unspecified eyelid: Secondary | ICD-10-CM | POA: Diagnosis not present

## 2013-04-01 DIAGNOSIS — E119 Type 2 diabetes mellitus without complications: Secondary | ICD-10-CM | POA: Diagnosis not present

## 2013-04-01 DIAGNOSIS — Z6838 Body mass index (BMI) 38.0-38.9, adult: Secondary | ICD-10-CM | POA: Diagnosis not present

## 2013-04-01 DIAGNOSIS — Z7982 Long term (current) use of aspirin: Secondary | ICD-10-CM | POA: Diagnosis not present

## 2013-04-01 DIAGNOSIS — G4733 Obstructive sleep apnea (adult) (pediatric): Secondary | ICD-10-CM | POA: Diagnosis not present

## 2013-04-01 DIAGNOSIS — F17200 Nicotine dependence, unspecified, uncomplicated: Secondary | ICD-10-CM | POA: Diagnosis not present

## 2013-04-01 DIAGNOSIS — H0230 Blepharochalasis unspecified eye, unspecified eyelid: Secondary | ICD-10-CM | POA: Diagnosis not present

## 2013-04-01 DIAGNOSIS — Z91013 Allergy to seafood: Secondary | ICD-10-CM | POA: Diagnosis not present

## 2013-04-07 DIAGNOSIS — H0230 Blepharochalasis unspecified eye, unspecified eyelid: Secondary | ICD-10-CM | POA: Diagnosis not present

## 2013-04-07 DIAGNOSIS — H02409 Unspecified ptosis of unspecified eyelid: Secondary | ICD-10-CM | POA: Diagnosis not present

## 2013-04-21 DIAGNOSIS — R609 Edema, unspecified: Secondary | ICD-10-CM | POA: Diagnosis not present

## 2013-04-21 DIAGNOSIS — I10 Essential (primary) hypertension: Secondary | ICD-10-CM | POA: Diagnosis not present

## 2013-04-29 ENCOUNTER — Encounter: Payer: Self-pay | Admitting: *Deleted

## 2013-04-29 ENCOUNTER — Encounter: Payer: Medicare Other | Attending: Family Medicine | Admitting: *Deleted

## 2013-04-29 VITALS — Ht 68.0 in | Wt 257.1 lb

## 2013-04-29 DIAGNOSIS — Z713 Dietary counseling and surveillance: Secondary | ICD-10-CM | POA: Insufficient documentation

## 2013-04-29 DIAGNOSIS — E119 Type 2 diabetes mellitus without complications: Secondary | ICD-10-CM | POA: Insufficient documentation

## 2013-04-29 NOTE — Patient Instructions (Addendum)
Plan:  Aim for 3 Carb Choices per meal (45 grams) +/- 1 either way  Aim for 0-2 Carbs per snack if hungry  Continue reading food labels for Total Carbohydrate and Fat Grams of foods Consider  increasing your activity level by walking after supper on less active days for 20 minutes daily as tolerated Consider checking BG at alternate times per day and recording so we can assess your BG patterns

## 2013-04-29 NOTE — Progress Notes (Signed)
  Medical Nutrition Therapy:  Appt start time: 0150 end time:  1015. Patient states his last A1c was 8.5 % on 04/21/2013  Assessment:  Primary concerns today: patient here for follow up diabetes education. Patient attended [employer] for Diabetes last year and is referred for follow up by his MD. Weight loss of another 12 pounds noted since last year! He states he is no longer on insulin or Byetta, current diabetes medication is Invokana. He states he is SMBG once a day, fasting with stated range of 157-202 mg/dl. He is complaining of numbness in his foot. 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  MEDICATIONS: see list   Usual physical activity: walking as able  Progress Towards Goal(s):  In progress.   Nutritional Diagnosis:  NB-1.1 Food and nutrition-related knowledge deficit As related to diabetes management.  As evidenced by A1c of 8.7% in May, 2014.    Intervention:  Nutrition counseling and diabetes education resumed. DiscussedCarb Counting and reading food labels in more detail and as a review. Also discussed benefits of increased activity and suggested a set schedule to include daily as tolerated. Suggested he SMBG at alternate times of day to determine patterns and be able to target excursions to improve A1c . Plan:  Aim for 3 Carb Choices per meal (45 grams) +/- 1 either way  Aim for 0-2 Carbs per snack if hungry  Continue reading food labels for Total Carbohydrate and Fat Grams of foods Consider  increasing your activity level by walking after supper on less active days for 20 minutes daily as tolerated Consider checking BG at alternate times per day and recording so we can assess your BG patterns  Handouts given during visit include: Carb Counting and Food Label handouts Meal Plan Card  Monitoring/Evaluation:  Dietary intake, exercise, reading food labels, and body weight in 4 week(s).

## 2013-05-05 ENCOUNTER — Other Ambulatory Visit: Payer: Self-pay | Admitting: Dermatology

## 2013-05-05 DIAGNOSIS — I872 Venous insufficiency (chronic) (peripheral): Secondary | ICD-10-CM | POA: Diagnosis not present

## 2013-05-05 DIAGNOSIS — B353 Tinea pedis: Secondary | ICD-10-CM | POA: Diagnosis not present

## 2013-05-05 DIAGNOSIS — L609 Nail disorder, unspecified: Secondary | ICD-10-CM | POA: Diagnosis not present

## 2013-05-05 DIAGNOSIS — B351 Tinea unguium: Secondary | ICD-10-CM | POA: Diagnosis not present

## 2013-05-12 DIAGNOSIS — H5310 Unspecified subjective visual disturbances: Secondary | ICD-10-CM | POA: Diagnosis not present

## 2013-05-26 ENCOUNTER — Encounter: Payer: Medicare Other | Attending: Family Medicine | Admitting: *Deleted

## 2013-05-26 ENCOUNTER — Encounter: Payer: Self-pay | Admitting: *Deleted

## 2013-05-26 DIAGNOSIS — Z713 Dietary counseling and surveillance: Secondary | ICD-10-CM | POA: Diagnosis not present

## 2013-05-26 DIAGNOSIS — E119 Type 2 diabetes mellitus without complications: Secondary | ICD-10-CM | POA: Insufficient documentation

## 2013-05-26 NOTE — Patient Instructions (Signed)
Plan:  Continue to aim for 3 Carb Choices per meal (45 grams) +/- 1 either way  Continue to aim for 0-2 Carbs per snack if hungry  Continue reading food labels for Total Carbohydrate and Fat Grams of foods Consider  increasing your activity level by walking after supper on less active days for 20 minutes daily as tolerated Continue checking BG at alternate times per day and recording so we can further assess your BG patterns

## 2013-05-26 NOTE — Progress Notes (Signed)
  Medical Nutrition Therapy:  Appt start time:  1715    end time:  1745. Patient states his last A1c was 8.5 % on 04/21/2013 Next A1c scheduled for 07/14/2013  Assessment:  Primary concerns today: patient here for follow up diabetes education follow up visit. He brought in his BG logs and they report that most of his BGs are within target range of below 160 mg/dl post meal. He continues to make good portion decisions when eating out as well as at home.   MEDICATIONS: see list   Usual physical activity: walking as able  Progress Towards Goal(s):  In progress.   Nutritional Diagnosis:  NB-1.1 Food and nutrition-related knowledge deficit As related to diabetes management.  As evidenced by A1c of 8.7% in May, 2014. Decreased ot 8.5% in June, 2014    Intervention:  Commended him on his continued improvements in food choices and also SMBG twice a day most days. He states he is comfortable with Carb Counting and reading food labels. He is aiming for less than 3 Carb Choices per meal. . Plan:  Continue to aim for 3 Carb Choices per meal (45 grams) +/- 1 either way  Continue to aim for 0-2 Carbs per snack if hungry  Continue reading food labels for Total Carbohydrate and Fat Grams of foods Consider  increasing your activity level by walking after supper on less active days for 20 minutes daily as tolerated Continue checking BG at alternate times per day and recording so we can further assess your BG patterns   Handouts given during visit include: Log Sheet for recording BG  Monitoring/Evaluation:  Dietary intake, exercise, reading food labels, and body weight in 8 week(s). This will be after his next appt with Dr. Talmage Nap and he will have the next A1c by then.

## 2013-06-02 DIAGNOSIS — Z961 Presence of intraocular lens: Secondary | ICD-10-CM | POA: Diagnosis not present

## 2013-06-02 DIAGNOSIS — E11329 Type 2 diabetes mellitus with mild nonproliferative diabetic retinopathy without macular edema: Secondary | ICD-10-CM | POA: Diagnosis not present

## 2013-06-02 DIAGNOSIS — E1139 Type 2 diabetes mellitus with other diabetic ophthalmic complication: Secondary | ICD-10-CM | POA: Diagnosis not present

## 2013-06-06 ENCOUNTER — Ambulatory Visit (INDEPENDENT_AMBULATORY_CARE_PROVIDER_SITE_OTHER): Payer: Medicare Other | Admitting: Family Medicine

## 2013-06-06 ENCOUNTER — Encounter: Payer: Self-pay | Admitting: Family Medicine

## 2013-06-06 VITALS — BP 98/64 | HR 110 | Temp 98.4°F | Wt 270.0 lb

## 2013-06-06 DIAGNOSIS — I10 Essential (primary) hypertension: Secondary | ICD-10-CM | POA: Diagnosis not present

## 2013-06-06 DIAGNOSIS — R42 Dizziness and giddiness: Secondary | ICD-10-CM

## 2013-06-06 DIAGNOSIS — Z79899 Other long term (current) drug therapy: Secondary | ICD-10-CM

## 2013-06-06 NOTE — Patient Instructions (Signed)
Vertigo Vertigo means you feel like you or your surroundings are moving when they are not. Vertigo can be dangerous if it occurs when you are at work, driving, or performing difficult activities.  CAUSES  Vertigo occurs when there is a conflict of signals sent to your brain from the visual and sensory systems in your body. There are many different causes of vertigo, including:  Infections, especially in the inner ear.  A bad reaction to a drug or misuse of alcohol and medicines.  Withdrawal from drugs or alcohol.  Rapidly changing positions, such as lying down or rolling over in bed.  A migraine headache.  Decreased blood flow to the brain.  Increased pressure in the brain from a head injury, infection, tumor, or bleeding. SYMPTOMS  You may feel as though the world is spinning around or you are falling to the ground. Because your balance is upset, vertigo can cause nausea and vomiting. You may have involuntary eye movements (nystagmus). DIAGNOSIS  Vertigo is usually diagnosed by physical exam. If the cause of your vertigo is unknown, your caregiver may perform imaging tests, such as an MRI scan (magnetic resonance imaging). TREATMENT  Most cases of vertigo resolve on their own, without treatment. Depending on the cause, your caregiver may prescribe certain medicines. If your vertigo is related to body position issues, your caregiver may recommend movements or procedures to correct the problem. In rare cases, if your vertigo is caused by certain inner ear problems, you may need surgery. HOME CARE INSTRUCTIONS   Follow your caregiver's instructions.  Avoid driving.  Avoid operating heavy machinery.  Avoid performing any tasks that would be dangerous to you or others during a vertigo episode.  Tell your caregiver if you notice that certain medicines seem to be causing your vertigo. Some of the medicines used to treat vertigo episodes can actually make them worse in some people. SEEK  IMMEDIATE MEDICAL CARE IF:   Your medicines do not relieve your vertigo or are making it worse.  You develop problems with talking, walking, weakness, or using your arms, hands, or legs.  You develop severe headaches.  Your nausea or vomiting continues or gets worse.  You develop visual changes.  A family member notices behavioral changes.  Your condition gets worse. MAKE SURE YOU:  Understand these instructions.  Will watch your condition.  Will get help right away if you are not doing well or get worse. Document Released: 07/30/2005 Document Revised: 01/12/2012 Document Reviewed: 05/08/2011 Exit[employer] Patient Information 2014 [employer].  

## 2013-06-06 NOTE — Progress Notes (Signed)
  Subjective:    Patient ID: Steve Charles, male    DOB: [birth year], 67 y.o.   MRN: [id number]  HPI Acute visit Patient was at [facility] yesterday doing a NASCAR simulator During movement generated with that game he developed some vertigo and nausea. Mild recurrent symptoms this morning but minimal symptoms currently. Denies any ataxia. No headaches. No confusion. No hearing changes. No focal weakness. No speech difficulties. No swallowing difficulties.  He has type 2 diabetes followed by endocrinologist. Blood pressure which has been stable. No recent orthostasis.  Patient recently saw a dermatologist with onychomycosis. He has questions regarding suitability for Lamisil. He has no history of hepatic dysfunction. Liver transaminases back in March were normal  Past Medical History  Diagnosis Date  . DYSLIPIDEMIA 11/29/2009  . HYPERTENSION, ESSENTIAL 11/29/2009  . URTICARIA 02/06/2010  . BPH (benign prostatic hypertrophy)   . Osteoarthritis   . DM (diabetes mellitus)   . OSA (obstructive sleep apnea) 03/26/2011    CPAP 9 cm H2O  . COPD (chronic obstructive pulmonary disease) 05/01/2011    PFT 05/01/11>>FEV1 2.07(72%), FEV1% 65, TLC 6.79(111%), DLCO 69%, no BD  . CAD (coronary artery disease)    Past Surgical History  Procedure Laterality Date  . Appendectomy  1963  . Spine surgery      spinal fusion - L4, L5  . Total knee arthroplasty  2007  . Transurethral resection of prostate      bph  . Nasal sinus surgery  1989    reports that he has been smoking Cigarettes.  He has a 50 pack-year smoking history. He has never used smokeless tobacco. He reports that  drinks alcohol. He reports that he does not use illicit drugs. family history includes Arthritis in his other; Cancer in his father; Diabetes in his other; and Prostate cancer in his brother. Allergies  Allergen Reactions  . Codeine Sulfate     REACTION: hives  . Morphine And Related     Oral morphine  .  Propoxyphene-Acetaminophen     REACTION: hives  . Ramipril       Review of Systems  Eyes: Negative for visual disturbance.  Respiratory: Negative for cough and shortness of breath.   Cardiovascular: Negative for chest pain.  Endocrine: Negative for polydipsia and polyuria.  Neurological: Positive for dizziness. Negative for tremors, seizures, syncope, speech difficulty, weakness, light-headedness and headaches.       Objective:   Physical Exam  Constitutional: He is oriented to person, place, and time. He appears well-developed and well-nourished.  Eyes: Pupils are equal, round, and reactive to light.  Neck:  No carotid bruits  Cardiovascular: Normal rate and regular rhythm.   Pulmonary/Chest: Effort normal and breath sounds normal. No respiratory distress. He has no wheezes. He has no rales.  Musculoskeletal: He exhibits no edema.  Neurological: He is alert and oriented to person, place, and time. No cranial nerve deficit.  Normal cerebellar function. Gait is normal. No focal weakness. Very minimal horizontal nystagmus. No vertical nystagmus          Assessment & Plan:  #1 transient vertigo. Suspect benign positional vertigo. No red flags. Observation and reassurance. Consider vestibular rehabilitation if symptoms persist.  Followup promptly for any new symptoms #2 hypertension. Well controlled #3 onychomycosis toenails. Patient requesting consideration for Lamisil treatment per dermatology. Obtain hepatic panel prior to starting this

## 2013-06-09 ENCOUNTER — Other Ambulatory Visit (INDEPENDENT_AMBULATORY_CARE_PROVIDER_SITE_OTHER): Payer: Medicare Other

## 2013-06-09 DIAGNOSIS — Z79899 Other long term (current) drug therapy: Secondary | ICD-10-CM | POA: Diagnosis not present

## 2013-06-09 LAB — HEPATIC FUNCTION PANEL
ALT: 21 U/L (ref 0–53)
AST: 17 U/L (ref 0–37)
Albumin: 4.1 g/dL (ref 3.5–5.2)

## 2013-06-09 LAB — BASIC METABOLIC PANEL
Calcium: 9.5 mg/dL (ref 8.4–10.5)
GFR: 108.29 mL/min (ref >60.00)
Sodium: 138 mEq/L (ref 135–145)

## 2013-06-10 ENCOUNTER — Encounter: Payer: Self-pay | Admitting: Family Medicine

## 2013-07-21 ENCOUNTER — Encounter: Payer: Medicare Other | Attending: Family Medicine | Admitting: *Deleted

## 2013-07-21 ENCOUNTER — Encounter: Payer: Self-pay | Admitting: *Deleted

## 2013-07-21 VITALS — Ht 68.0 in | Wt 256.3 lb

## 2013-07-21 DIAGNOSIS — E119 Type 2 diabetes mellitus without complications: Secondary | ICD-10-CM | POA: Insufficient documentation

## 2013-07-21 DIAGNOSIS — Z713 Dietary counseling and surveillance: Secondary | ICD-10-CM | POA: Diagnosis not present

## 2013-07-21 NOTE — Progress Notes (Signed)
  Medical Nutrition Therapy:  Appt start time:  1215    end time:  1245. Patient states his last A1c was 8.0 % on 07/14/2013  Assessment:  Primary concerns today: patient here for follow up diabetes education follow up visit. We weighed on Tanita Scale today too. FBG in good control with reported range of 122-148.  TANITA  BODY COMP RESULTS on 07/21/13  Weight 255.5 lb   BMI (kg/m^2) 38.8   Fat Mass (lbs) 97.5 lb   Fat Free Mass (lbs) 158.0 lb   Total Body Water (lbs) 115.5 lb    MEDICATIONS: see list   Usual physical activity: walking as able  Progress Towards Goal(s):  In progress.   Nutritional Diagnosis:  NB-1.1 Food and nutrition-related knowledge deficit As related to diabetes management.  As evidenced by A1c of 8.7% in May, 2014. Decreased ot 8.5% in June, 2014    Intervention:  Commended him on his continued improvements in food choices and also SMBG twice a day most days. He continues to be comfortable with Carb Counting and reading food labels. He is aiming for less than 3 Carb Choices per meal. . Plan:  Continue to aim for 3 Carb Choices per meal (45 grams) +/- 1 either way  Continue to aim for 0-2 Carbs per snack if hungry  Continue reading food labels for Total Carbohydrate and Fat Grams of foods Consider  increasing your activity level by walking after supper on less active days for 20 minutes daily as tolerated Continue checking BG at alternate times per day and recording so we can further assess your BG patterns   Handouts given during visit include: Log Sheet for recording BG  Monitoring/Evaluation:  Dietary intake, exercise, reading food labels, and body weight in 3 months. This will be after his next appt with Dr. Talmage Nap and he will have the next A1c by then.

## 2013-07-21 NOTE — Patient Instructions (Signed)
Plan:  Continue to aim for 3 Carb Choices per meal (45 grams) +/- 1 either way  Continue to aim for 0-2 Carbs per snack if hungry  Continue reading food labels for Total Carbohydrate and Fat Grams of foods Consider  increasing your activity level by walking after supper on less active days for 20 minutes daily as tolerated Continue checking BG at alternate times per day and recording so we can further assess your BG patterns    

## 2013-08-04 DIAGNOSIS — B351 Tinea unguium: Secondary | ICD-10-CM | POA: Diagnosis not present

## 2013-08-04 DIAGNOSIS — Z79899 Other long term (current) drug therapy: Secondary | ICD-10-CM | POA: Diagnosis not present

## 2013-08-12 ENCOUNTER — Ambulatory Visit (INDEPENDENT_AMBULATORY_CARE_PROVIDER_SITE_OTHER): Payer: Medicare Other

## 2013-08-12 DIAGNOSIS — Z23 Encounter for immunization: Secondary | ICD-10-CM | POA: Diagnosis not present

## 2013-08-18 DIAGNOSIS — B351 Tinea unguium: Secondary | ICD-10-CM | POA: Diagnosis not present

## 2013-08-18 DIAGNOSIS — L259 Unspecified contact dermatitis, unspecified cause: Secondary | ICD-10-CM | POA: Diagnosis not present

## 2013-08-18 DIAGNOSIS — I872 Venous insufficiency (chronic) (peripheral): Secondary | ICD-10-CM | POA: Diagnosis not present

## 2013-09-15 ENCOUNTER — Ambulatory Visit (INDEPENDENT_AMBULATORY_CARE_PROVIDER_SITE_OTHER): Payer: Medicare Other | Admitting: Pulmonary Disease

## 2013-09-15 ENCOUNTER — Encounter: Payer: Self-pay | Admitting: Pulmonary Disease

## 2013-09-15 VITALS — BP 110/72 | HR 94 | Ht 68.0 in | Wt 257.0 lb

## 2013-09-15 DIAGNOSIS — F17200 Nicotine dependence, unspecified, uncomplicated: Secondary | ICD-10-CM

## 2013-09-15 DIAGNOSIS — J438 Other emphysema: Secondary | ICD-10-CM | POA: Diagnosis not present

## 2013-09-15 DIAGNOSIS — J439 Emphysema, unspecified: Secondary | ICD-10-CM

## 2013-09-15 DIAGNOSIS — G4733 Obstructive sleep apnea (adult) (pediatric): Secondary | ICD-10-CM

## 2013-09-15 DIAGNOSIS — Z720 Tobacco use: Secondary | ICD-10-CM

## 2013-09-15 NOTE — Progress Notes (Signed)
Chief Complaint  Patient presents with  . COPD    Breathing has been doing well since he lost weight.  . Sleep Apnea    Currently is not using CPAP as much as he should be.   CC: [name]   History of Present Illness: Steve Charles is a 67 y.o. male smoker with COPD and OSA using auto CPAP.  He has been feeling better since I saw him last year.  He has changed his diet, and with this has been able to lose 30 lbs since 2013.  He is breathing better, and able to do more activity.  He still smokes 1 pack per day.  He is trying to quit on his own.  He is not having cough, wheeze, or sputum.  He still uses CPAP.  He has noticed that his sleep is okay without CPAP when he is sleeping in a recliner >> this has occurred after his weight loss.    Wt Readings from Last 10 Encounters:  09/15/13 257 lb (116.574 kg)  07/21/13 256 lb 4.8 oz (116.257 kg)  06/06/13 270 lb (122.471 kg)  04/29/13 257 lb 1.6 oz (116.62 kg)  01/13/13 261 lb (118.389 kg)  10/07/12 258 lb (117.028 kg)  08/19/12 266 lb (120.657 kg)  07/01/12 271 lb 8 oz (123.152 kg)  04/05/12 282 lb (127.914 kg)  03/08/12 285 lb (129.275 kg)    Tests: PSG 03/26/11>>AHI 34.5, SpO2 low 78%.  CPAP 9 cm>>AHI 8, +REM/supine PFT 05/01/11>>FEV1 2.07(72%), FEV1% 65, TLC 6.79(111%), RV 3.42(146%), DLCO 69%, no BD CPAP 10/16/11 to 12/04/11>>Used on 50 of 50 nights with average 4 hrs 12 min.  Average AHI 3.8 with CPAP 12 cm H2O.  He  has a past medical history of DYSLIPIDEMIA (11/29/2009); HYPERTENSION, ESSENTIAL (11/29/2009); URTICARIA (02/06/2010); BPH (benign prostatic hypertrophy); Osteoarthritis; DM (diabetes mellitus); OSA (obstructive sleep apnea) (03/26/2011); COPD (chronic obstructive pulmonary disease) (05/01/2011); and CAD (coronary artery disease).  He  has past surgical history that includes Appendectomy (1963); Spine surgery; Total knee arthroplasty (2007); Transurethral resection of prostate; and Nasal sinus surgery  (1989).  Current Outpatient Prescriptions on File Prior to Visit  Medication Sig Dispense Refill  . aspirin 81 MG tablet Take 81 mg by mouth daily.        Marland Kitchen EPIPEN 2-PAK 0.3 MG/0.3ML DEVI Use as directed      . furosemide (LASIX) 40 MG tablet Take 40 mg by mouth daily as needed.        . INVOKANA 100 MG TABS Take 1 tablet by mouth Daily. per Dr Talmage Nap (endocrinology)      . losartan (COZAAR) 50 MG tablet Take 1 tablet (50 mg total) by mouth daily.  90 tablet  3  . metFORMIN (GLUCOPHAGE) 500 MG tablet Take by mouth. 2 pills twice daily       . sildenafil (VIAGRA) 100 MG tablet Take 0.5-1 tablets (50-100 mg total) by mouth daily as needed for erectile dysfunction.  5 tablet  11  . simvastatin (ZOCOR) 20 MG tablet Take 1 tablet (20 mg total) by mouth at bedtime.  90 tablet  3   No current facility-administered medications on file prior to visit.    Allergies  Allergen Reactions  . Codeine Sulfate     REACTION: hives  . Morphine And Related     Oral morphine  . Propoxyphene-Acetaminophen     REACTION: hives  . Ramipril     Physical Exam:  General - Obese  HEENT - no sinus  tenderness, MP 3, no oral exudate, no LAN Cardiac - s1s2, no murmur Chest - diminished breath sounds, no wheezing or rales  Abd - obese, soft, non-tender  Ext - no edena Neuro - normal strength Psych - normal mood/behavior  Skin - no rashes  Assessment/Plan:  Coralyn Helling, MD [facility] Pulmonary/Critical Care 09/16/2013, 12:44 PM Pager:  [phone] After 3pm call: [phone]

## 2013-09-15 NOTE — Patient Instructions (Signed)
Will get report from CPAP machine Follow up in 1 year 

## 2013-09-16 NOTE — Assessment & Plan Note (Signed)
No significant respiratory symptoms at present.  Will monitor off inhaler therapy.

## 2013-09-16 NOTE — Assessment & Plan Note (Addendum)
Likely improved with recent weight loss.  Don't think he is at a point that we could consider d/c'ing CPAP >> explained that if he can continue weight loss, then could re-assess his need for CPAP.  Continue auto CPAP for now.  Will get copy of his CPAP report and call him with results.

## 2013-09-16 NOTE — Assessment & Plan Note (Signed)
Reviewed different options to help with smoking cessation.

## 2013-09-22 ENCOUNTER — Telehealth: Payer: Self-pay | Admitting: Cardiology

## 2013-09-22 ENCOUNTER — Encounter: Payer: Self-pay | Admitting: *Deleted

## 2013-09-22 ENCOUNTER — Ambulatory Visit (INDEPENDENT_AMBULATORY_CARE_PROVIDER_SITE_OTHER): Payer: Medicare Other | Admitting: *Deleted

## 2013-09-22 VITALS — BP 108/76 | HR 114 | Ht 68.0 in | Wt 254.1 lb

## 2013-09-22 DIAGNOSIS — R002 Palpitations: Secondary | ICD-10-CM | POA: Diagnosis not present

## 2013-09-22 NOTE — Telephone Encounter (Signed)
New message     C/o chest pressure, heart flutter, lightheadness.  pls advise

## 2013-09-22 NOTE — Progress Notes (Signed)
Pt came in today for EKG b/c he had been experiencing "heart fluttering" with some lightheadedness & dizziness. No chest paiin, no nausea, no edema, no blurred vision. Dr. Jens Som reviewed EKG.  Follow-up appointment scheduled for patient Steve Red RN

## 2013-09-22 NOTE — Telephone Encounter (Signed)
Pt calls this morning b/c he has experienced some "heart fluttering & light headedness" He had an accompanying dizzy spell.  He is not symptomatic at this time Denies any chest pain or shortness of breath. Pt has already had his 4 usual mugs of caffeinated coffee this morning as he has done for "years" Offered appt. EKG today. He accepted & will call back if he changes his mind & doesn't need the appointment. Mylo Red RN

## 2013-10-17 ENCOUNTER — Telehealth: Payer: Self-pay | Admitting: Family Medicine

## 2013-10-17 ENCOUNTER — Encounter (HOSPITAL_COMMUNITY): Payer: Self-pay | Admitting: Emergency Medicine

## 2013-10-17 ENCOUNTER — Emergency Department (INDEPENDENT_AMBULATORY_CARE_PROVIDER_SITE_OTHER)
Admission: EM | Admit: 2013-10-17 | Discharge: 2013-10-17 | Disposition: A | Discharge status: Home / Self Care | Payer: Medicare Other | Source: Home / Self Care | Attending: Family Medicine | Admitting: Family Medicine

## 2013-10-17 DIAGNOSIS — R51 Headache: Secondary | ICD-10-CM | POA: Diagnosis not present

## 2013-10-17 MED ORDER — IPRATROPIUM BROMIDE 0.06 % NA SOLN: 2.0000 | Freq: Four times a day (QID) | NASAL | Status: DC

## 2013-10-17 NOTE — ED Provider Notes (Signed)
CSN: [id number]     Arrival date & time 10/17/13  1613 History   First MD Initiated Contact with Patient 10/17/13 1802     Chief Complaint  Patient presents with  . Headache   (Consider location/radiation/quality/duration/timing/severity/associated sxs/prior Treatment) Patient is a 67 y.o. male presenting with headaches. The history is provided by the patient.  Headache Pain location:  Occipital and frontal Quality:  Sharp Radiates to:  Does not radiate Onset quality:  Gradual Duration:  12 hours Progression:  Improving Chronicity:  New Similar to prior headaches: yes   Context comment:  Awoke with sinus type ha, no neuro sx. Associated symptoms: congestion, facial pain, neck pain and sinus pressure   Associated symptoms: no abdominal pain, no dizziness, no fever, no focal weakness, no loss of balance, no nausea, no near-syncope, no numbness, no syncope, no visual change, no vomiting and no weakness     Past Medical History  Diagnosis Date  . DYSLIPIDEMIA 11/29/2009  . HYPERTENSION, ESSENTIAL 11/29/2009  . URTICARIA 02/06/2010  . BPH (benign prostatic hypertrophy)   . Osteoarthritis   . DM (diabetes mellitus)   . OSA (obstructive sleep apnea) 03/26/2011    CPAP 9 cm H2O  . COPD (chronic obstructive pulmonary disease) 05/01/2011    PFT 05/01/11>>FEV1 2.07(72%), FEV1% 65, TLC 6.79(111%), DLCO 69%, no BD  . CAD (coronary artery disease)    Past Surgical History  Procedure Laterality Date  . Appendectomy  1963  . Spine surgery      spinal fusion - L4, L5  . Total knee arthroplasty  2007  . Transurethral resection of prostate      bph  . Nasal sinus surgery  1989   Family History  Problem Relation Age of Onset  . Arthritis Other   . Prostate cancer Brother   . Diabetes Other   . Cancer Father    History  Substance Use Topics  . Smoking status: Current Every Day Smoker -- 1.00 packs/day for 50 years    Types: Cigarettes    Last Attempt to Quit: 12/04/2010  .  Smokeless tobacco: Never Used     Comment: pt is again smoking < 1 PPD  . Alcohol Use: Yes     Comment: occasionallly    Review of Systems  Constitutional: Negative.  Negative for fever.  HENT: Positive for congestion and sinus pressure.   Cardiovascular: Negative for syncope and near-syncope.  Gastrointestinal: Negative for nausea, vomiting and abdominal pain.  Musculoskeletal: Positive for neck pain.  Neurological: Positive for headaches. Negative for dizziness, focal weakness, numbness and loss of balance.    Allergies  Codeine sulfate; Morphine and related; Propoxyphene-acetaminophen; and Ramipril  Home Medications   Current Outpatient Rx  Name  Route  Sig  Dispense  Refill  . aspirin 81 MG tablet   Oral   Take 81 mg by mouth daily.           Marland Kitchen EPIPEN 2-PAK 0.3 MG/0.3ML DEVI      Use as directed         . furosemide (LASIX) 40 MG tablet   Oral   Take 40 mg by mouth daily as needed.           . INVOKANA 100 MG TABS   Oral   Take 1 tablet by mouth Daily. per Dr Talmage Nap (endocrinology)         . ipratropium (ATROVENT) 0.06 % nasal spray   Nasal   Place 2 sprays into the nose 4 (four)  times daily.   15 mL   1   . losartan (COZAAR) 50 MG tablet   Oral   Take 1 tablet (50 mg total) by mouth daily.   90 tablet   3   . metFORMIN (GLUCOPHAGE) 500 MG tablet   Oral   Take by mouth. 2 pills twice daily          . sildenafil (VIAGRA) 100 MG tablet   Oral   Take 0.5-1 tablets (50-100 mg total) by mouth daily as needed for erectile dysfunction.   5 tablet   11   . simvastatin (ZOCOR) 20 MG tablet   Oral   Take 1 tablet (20 mg total) by mouth at bedtime.   90 tablet   3    BP 138/72  Pulse 86  Temp(Src) 98.3 F (36.8 C) (Oral)  Resp 16  SpO2 99% Physical Exam  Nursing note and vitals reviewed. Constitutional: He is oriented to person, place, and time. He appears well-developed and well-nourished. No distress.  Eyes: Conjunctivae and EOM are  normal. Pupils are equal, round, and reactive to light.  Neck: Normal range of motion. Neck supple.  Cardiovascular: Normal rate, normal heart sounds and intact distal pulses.   Pulmonary/Chest: Breath sounds normal.  Musculoskeletal: Normal range of motion. He exhibits no tenderness.  Lymphadenopathy:    He has no cervical adenopathy.  Neurological: He is alert and oriented to person, place, and time. He displays normal reflexes. No cranial nerve deficit. He exhibits normal muscle tone. Coordination normal.  Skin: Skin is warm and dry.    ED Course  Procedures (including critical care time) Labs Review Labs Reviewed - No data to display Imaging Review No results found.  EKG Interpretation    Date/Time:    Ventricular Rate:    PR Interval:    QRS Duration:   QT Interval:    QTC Calculation:   R Axis:     Text Interpretation:              MDM      Linna Hoff, MD 10/17/13 [phone]

## 2013-10-17 NOTE — Telephone Encounter (Signed)
Patient Information:  Caller Name: Amogh  Phone: [phone]  Patient: Steve Charles, Steve Charles  Gender: Male  DOB: 1946-10-31  Age: 67 Years  PCP: Evelena Peat (Family Practice)  Office Follow Up:  Does the office need to follow up with this patient?: No  Instructions For The Office: Attempted to make an appt- no appt available at any location- sent pt to UC- will comply and go to [facility]   Symptoms  Reason For Call & Symptoms: Pt calling and states that he woke up at 0200  today 10/17/13 with a bad sinus headache; sinuses arehurting; whole face is hurting;  rates 7/10;  Reviewed Health History In EMR: Yes  Reviewed Medications In EMR: Yes  Reviewed Allergies In EMR: Yes  Reviewed Surgeries / Procedures: Yes  Date of Onset of Symptoms: 10/17/2013  Treatments Tried: Motrin and Neti Pot with no relief  Treatments Tried Worked: No  Guideline(s) Used:  Headache  Sinus Pain and Congestion  Disposition Per Guideline:   Go to Office Now  Reason For Disposition Reached:   Severe sinus pain  Advice Given:  Call Back If:   You become worse.  Patient Will Follow Care Advice:  YES

## 2013-10-17 NOTE — ED Notes (Signed)
Reported HA today

## 2013-10-21 ENCOUNTER — Encounter: Payer: Medicare Other | Attending: Family Medicine | Admitting: *Deleted

## 2013-10-21 VITALS — Ht 68.0 in | Wt 258.3 lb

## 2013-10-21 DIAGNOSIS — E119 Type 2 diabetes mellitus without complications: Secondary | ICD-10-CM | POA: Insufficient documentation

## 2013-10-21 DIAGNOSIS — Z713 Dietary counseling and surveillance: Secondary | ICD-10-CM | POA: Diagnosis not present

## 2013-10-21 NOTE — Patient Instructions (Signed)
Plan:  Continue to aim for 3 Carb Choices per meal (45 grams) +/- 1 either way  Continue to aim for 0-2 Carbs per snack if hungry  Continue reading food labels for Total Carbohydrate and Fat Grams of foods Consider  increasing your activity level by walking after supper on less active days for 20 minutes daily as tolerated Continue checking BG at alternate times per day and recording so we can further assess your BG patterns

## 2013-10-21 NOTE — Progress Notes (Signed)
  Medical Nutrition Therapy:  Appt start time:  1045    end time:  1115. Patient states his last A1c was 8.0 % on 07/14/2013  Assessment:  Primary concerns today: patient here for follow up diabetes education follow up visit. He states he has just come from his endocrinologist appointment with Dr. Talmage Nap. He evidently ran out of his Invokana and it was too expensive to pay for so he stopped it for about a month. He is still taking his Metformin at this time. He offered questions about the difference between Type 1 and Type 2 Diabetes, action of insulin in the body and some food questions. He continues with his current meal plan and activity level. He expressed interest in attending the Core Diabetes Classes again next calendar year.  TANITA  BODY COMP RESULTS on 07/21/13  Weight 255.5 lb   BMI (kg/m^2) 38.8   Fat Mass (lbs) 97.5 lb   Fat Free Mass (lbs) 158.0 lb   Total Body Water (lbs) 115.5 lb   TANITA  BODY COMP RESULTS on 10/21/13  Weight 257.5 lb   BMI (kg/m^2) 39.2 lb   Fat Mass (lbs) 97.5 lb   Fat Free Mass (lbs) 160.0 lb   Total Body Water (lbs) 117.0 lb   Weight gain of 2 pounds apparently from Fat Free Mass and water weight.  MEDICATIONS: see list   Usual physical activity: walking as able  Progress Towards Goal(s):  In progress.   Nutritional Diagnosis:  NB-1.1 Food and nutrition-related knowledge deficit As related to diabetes management.  As evidenced by A1c of 8.7% in May, 2014. Decreased ot 8.5% in June, 2014, now down to 8.0% in September, 2014    Intervention: Nutrition counseling and diabetes education continued. Discussed basic physiology of diabetes, SMBG and rationale of checking BG at alternate times of day, A1c, and Carb Counting. Also discussed Tanita Scale results and encouraged him to let his MD know if he runs out of medications in the future.  Plan:  Continue to aim for 3 Carb Choices per meal (45 grams) +/- 1 either way  Continue to aim for 0-2 Carbs per  snack if hungry  Continue reading food labels for Total Carbohydrate and Fat Grams of foods Consider  increasing your activity level by walking after supper on less active days for 20 minutes daily as tolerated Continue checking BG at alternate times per day and recording so we can further assess your BG patterns   Handouts given during visit include: Living Well with Diabetes  Monitoring/Evaluation:  Dietary intake, exercise, reading food labels, and body weight in 3 months.

## 2013-10-28 ENCOUNTER — Ambulatory Visit (INDEPENDENT_AMBULATORY_CARE_PROVIDER_SITE_OTHER): Payer: Medicare Other | Admitting: Cardiology

## 2013-10-28 ENCOUNTER — Encounter: Payer: Self-pay | Admitting: Family Medicine

## 2013-10-28 ENCOUNTER — Encounter: Payer: Self-pay | Admitting: Cardiology

## 2013-10-28 VITALS — BP 115/57 | HR 120 | Ht 68.0 in | Wt 257.0 lb

## 2013-10-28 DIAGNOSIS — R002 Palpitations: Secondary | ICD-10-CM | POA: Diagnosis not present

## 2013-10-28 DIAGNOSIS — I10 Essential (primary) hypertension: Secondary | ICD-10-CM

## 2013-10-28 DIAGNOSIS — I2510 Atherosclerotic heart disease of native coronary artery without angina pectoris: Secondary | ICD-10-CM

## 2013-10-28 DIAGNOSIS — R55 Syncope and collapse: Secondary | ICD-10-CM | POA: Diagnosis not present

## 2013-10-28 DIAGNOSIS — F17200 Nicotine dependence, unspecified, uncomplicated: Secondary | ICD-10-CM

## 2013-10-28 DIAGNOSIS — Z720 Tobacco use: Secondary | ICD-10-CM

## 2013-10-28 MED ORDER — LOSARTAN POTASSIUM 25 MG PO TABS: 25.0000 mg | ORAL_TABLET | Freq: Every day | ORAL | Status: DC

## 2013-10-28 MED ORDER — METOPROLOL SUCCINATE ER 25 MG PO TB24: 25.0000 mg | ORAL_TABLET | Freq: Every day | ORAL | Status: DC

## 2013-10-28 NOTE — Patient Instructions (Signed)
Your physician recommends that you schedule a follow-up appointment in: 6-8 WEEKS WITH DR Jens Som  Your physician has requested that you have an echocardiogram. Echocardiography is a painless test that uses sound waves to create images of your heart. It provides your doctor with information about the size and shape of your heart and how well your heart's chambers and valves are working. This procedure takes approximately one hour. There are no restrictions for this procedure.   Your physician recommends that you HAVE LAB WORK TODAY  DECREASE LOSARTAN TO 25 MG ONCE DAILY  START METOPROLOL SUCC ER 25 MG ONCE DAILY

## 2013-10-28 NOTE — Assessment & Plan Note (Signed)
Blood pressure controlled. I am adding low-dose beta-blockade or palpitations. I will therefore decrease Cozaar to 25 mg daily.

## 2013-10-28 NOTE — Assessment & Plan Note (Addendum)
Patient has had palpitations and dizzy spells. His heart rate has been as high as 130 and today he has sinus tachycardia. Check echocardiogram and TSH. Patient had his hemoglobin checked prior to giving blood today and it was 18.2. I asked him to followup with primary care. He does smoke. Add Toprol 25 mg daily. If he has further spells we'll plan repeat monitor.

## 2013-10-28 NOTE — Assessment & Plan Note (Signed)
Continue statin. 

## 2013-10-28 NOTE — Assessment & Plan Note (Signed)
Patient needs to discontinue. 

## 2013-10-28 NOTE — Assessment & Plan Note (Signed)
Continue aspirin and statin. 

## 2013-10-28 NOTE — Progress Notes (Signed)
HPI: FU palpitations and dyspnea. Patient states that he has had atrial fibrillation after surgeries in the past but no other cardiac history. Echocardiogram in March of 2012 showed normal LV function and question mild RV dysfunction. Myoview in March 2012 showed extensive sub-diaphragmatic attenuation; EF 58%. Inferior ischemia cannot be excluded. TSH and BNP were normal. Cardionet revealed Sinus with PVCs. Cardiac cath in April of 2012 revealed normal LV function, normal pulmonary pressures and pulmonary capillary wedge pressure and nonobstructive coronary disease with the most significant lesion being a 50% LAD. He was diagnosed with COPD and asthma. Carotid Dopplers in May of 2013 were normal. I last saw him in May of 2013. Since then, he denies dyspnea, chest pain or pedal edema. In the past 2 weeks he has had elevated heart rates as high as 130. He had 2 separate episodes of near syncope while sitting down. These were associated with palpitations but no chest pain, nausea or dyspnea. He did not have frank syncope.   Current Outpatient Prescriptions  Medication Sig Dispense Refill  . aspirin 81 MG tablet Take 81 mg by mouth daily.        Marland Kitchen EPIPEN 2-PAK 0.3 MG/0.3ML DEVI Use as directed      . furosemide (LASIX) 40 MG tablet Take 40 mg by mouth daily as needed.        . INVOKANA 100 MG TABS Take 1 tablet by mouth Daily. per Dr Talmage Nap (endocrinology)      . losartan (COZAAR) 50 MG tablet Take 1 tablet (50 mg total) by mouth daily.  90 tablet  3  . metFORMIN (GLUCOPHAGE) 500 MG tablet Take by mouth. 2 pills twice daily       . simvastatin (ZOCOR) 20 MG tablet Take 1 tablet (20 mg total) by mouth at bedtime.  90 tablet  3   No current facility-administered medications for this visit.     Past Medical History  Diagnosis Date  . DYSLIPIDEMIA 11/29/2009  . HYPERTENSION, ESSENTIAL 11/29/2009  . URTICARIA 02/06/2010  . BPH (benign prostatic hypertrophy)   . Osteoarthritis   . DM (diabetes  mellitus)   . OSA (obstructive sleep apnea) 03/26/2011    CPAP 9 cm H2O  . COPD (chronic obstructive pulmonary disease) 05/01/2011    PFT 05/01/11>>FEV1 2.07(72%), FEV1% 65, TLC 6.79(111%), DLCO 69%, no BD  . CAD (coronary artery disease)     Past Surgical History  Procedure Laterality Date  . Appendectomy  1963  . Spine surgery      spinal fusion - L4, L5  . Total knee arthroplasty  2007  . Transurethral resection of prostate      bph  . Nasal sinus surgery  1989    History   Social History  . Marital Status: Married    Spouse Name: N/A    Number of Children: 3  . Years of Education: N/A   Occupational History  . Retired Other   Social History Main Topics  . Smoking status: Current Every Day Smoker -- 1.00 packs/day for 50 years    Types: Cigarettes    Last Attempt to Quit: 12/04/2010  . Smokeless tobacco: Never Used     Comment: pt is again smoking < 1 PPD  . Alcohol Use: Yes     Comment: occasionallly  . Drug Use: No  . Sexual Activity: Not on file   Other Topics Concern  . Not on file   Social History Narrative  . No narrative  on file    ROS: no fevers or chills, productive cough, hemoptysis, dysphasia, odynophagia, melena, hematochezia, dysuria, hematuria, rash, seizure activity, orthopnea, PND, pedal edema, claudication. Remaining systems are negative.  Physical Exam: Well-developed well-nourished in no acute distress.  Skin is warm and dry.  HEENT is normal.  Neck is supple.  Chest is clear to auscultation with normal expansion.  Cardiovascular exam is regular rate and rhythm.  Abdominal exam nontender or distended. No masses palpated. Extremities show no edema. neuro grossly intact  ECG sinus rhythm with occasional PVC.

## 2013-11-02 ENCOUNTER — Encounter: Payer: Self-pay | Admitting: Family Medicine

## 2013-11-02 ENCOUNTER — Ambulatory Visit (INDEPENDENT_AMBULATORY_CARE_PROVIDER_SITE_OTHER): Payer: Medicare Other | Admitting: Family Medicine

## 2013-11-02 VITALS — BP 112/70 | HR 110 | Temp 98.3°F | Wt 256.0 lb

## 2013-11-02 DIAGNOSIS — I10 Essential (primary) hypertension: Secondary | ICD-10-CM

## 2013-11-02 DIAGNOSIS — N401 Enlarged prostate with lower urinary tract symptoms: Secondary | ICD-10-CM | POA: Diagnosis not present

## 2013-11-02 DIAGNOSIS — E119 Type 2 diabetes mellitus without complications: Secondary | ICD-10-CM | POA: Diagnosis not present

## 2013-11-02 DIAGNOSIS — D751 Secondary polycythemia: Secondary | ICD-10-CM | POA: Diagnosis not present

## 2013-11-02 DIAGNOSIS — E785 Hyperlipidemia, unspecified: Secondary | ICD-10-CM | POA: Diagnosis not present

## 2013-11-02 LAB — LIPID PANEL
Cholesterol: 163 mg/dL (ref 0–200)
LDL Cholesterol: 99 mg/dL (ref 0–99)
Total CHOL/HDL Ratio: 5
Triglycerides: 154 mg/dL — ABNORMAL HIGH (ref 0.0–149.0)
VLDL: 30.8 mg/dL (ref 0.0–40.0)

## 2013-11-02 LAB — HEPATIC FUNCTION PANEL
AST: 14 U/L (ref 0–37)
Albumin: 4.3 g/dL (ref 3.5–5.2)
Alkaline Phosphatase: 78 U/L (ref 39–117)
Total Protein: 7.5 g/dL (ref 6.0–8.3)

## 2013-11-02 LAB — CBC WITH DIFFERENTIAL/PLATELET
Basophils Absolute: 0.1 10*3/uL (ref 0.0–0.1)
Basophils Relative: 0.6 % (ref 0.0–3.0)
Eosinophils Relative: 2.5 % (ref 0.0–5.0)
HCT: 48.9 % (ref 39.0–52.0)
Hemoglobin: 16.4 g/dL (ref 13.0–17.0)
Lymphs Abs: 2.8 10*3/uL (ref 0.7–4.0)
MCHC: 33.5 g/dL (ref 30.0–36.0)
Monocytes Relative: 8.4 % (ref 3.0–12.0)
Neutro Abs: 7.5 10*3/uL (ref 1.4–7.7)
Neutrophils Relative %: 64.8 % (ref 43.0–77.0)
RBC: 5.42 Mil/uL (ref 4.22–5.81)
RDW: 15.2 % — ABNORMAL HIGH (ref 11.5–14.6)
WBC: 11.6 10*3/uL — ABNORMAL HIGH (ref 4.5–10.5)

## 2013-11-02 LAB — BASIC METABOLIC PANEL
BUN: 15 mg/dL (ref 6–23)
CO2: 30 mEq/L (ref 19–32)
Calcium: 9.3 mg/dL (ref 8.4–10.5)
Creatinine, Ser: 0.8 mg/dL (ref 0.4–1.5)
Glucose, Bld: 203 mg/dL — ABNORMAL HIGH (ref 70–99)

## 2013-11-02 LAB — HEMOGLOBIN A1C: Hgb A1c MFr Bld: 9.3 % — ABNORMAL HIGH (ref 4.6–6.5)

## 2013-11-02 NOTE — Progress Notes (Signed)
Pre visit review using our clinic review tool, if applicable. No additional management support is needed unless otherwise documented below in the visit note. 

## 2013-11-02 NOTE — Patient Instructions (Signed)
Try to reduce caffeine intake gradually. Try to quit smoking!

## 2013-11-02 NOTE — Progress Notes (Signed)
**Note Steve-Identified via Obfuscation**    Subjective:    Patient ID: Steve Charles, male    DOB: 06-09-1946, 67 y.o.   MRN: [id number]  HPI Patient here for medical followup. He said prior history of atrial fibrillation following surgery and cardiac catheter April 2012 with nonobstructive coronary disease. Ongoing nicotine use. Was seen recently by cardiologist for elevated heart rate at 130. Had a couple episodes at rest of dizziness and near syncope. To start a low-dose metoprolol 25 mg daily just 2 days ago. Recent TSH normal. Had recent lab work with donating blood with hemoglobin reported at 18.2. He continues to smoke one pack of cigarettes per day. He does not have any history of polycythemia vera. For several years his hemoglobin has generally ranged between 16 and 18 range.  He does drink about 4 cups of coffee per day. Rare decongestant use. No recent chest pains. Type 2 diabetes followed by endocrinologist.  Patient requesting additional labs today. Takes metformin, losartan, furosemide, metoprolol, simvastatin, aspirin, and Invokana.  Past Medical History  Diagnosis Date  . DYSLIPIDEMIA 11/29/2009  . HYPERTENSION, ESSENTIAL 11/29/2009  . URTICARIA 02/06/2010  . BPH (benign prostatic hypertrophy)   . Osteoarthritis   . DM (diabetes mellitus)   . OSA (obstructive sleep apnea) 03/26/2011    CPAP 9 cm H2O  . COPD (chronic obstructive pulmonary disease) 05/01/2011    PFT 05/01/11>>FEV1 2.07(72%), FEV1% 65, TLC 6.79(111%), DLCO 69%, no BD  . CAD (coronary artery disease)    Past Surgical History  Procedure Laterality Date  . Appendectomy  1963  . Spine surgery      spinal fusion - L4, L5  . Total knee arthroplasty  2007  . Transurethral resection of prostate      bph  . Nasal sinus surgery  1989    reports that he has been smoking Cigarettes.  He has a 50 pack-year smoking history. He has never used smokeless tobacco. He reports that he drinks alcohol. He reports that he does not use illicit drugs. family history  includes Arthritis in his other; Cancer in his father; Diabetes in his other; Prostate cancer in his brother. Allergies  Allergen Reactions  . Codeine Sulfate     REACTION: hives  . Morphine And Related     Oral morphine  . Propoxyphene N-Acetaminophen     REACTION: hives  . Ramipril       Review of Systems  Constitutional: Negative for fatigue.  Eyes: Negative for visual disturbance.  Respiratory: Negative for cough, chest tightness and shortness of breath.   Cardiovascular: Positive for palpitations. Negative for chest pain and leg swelling.  Neurological: Positive for dizziness. Negative for syncope, weakness, light-headedness and headaches.       Objective:   Physical Exam  Constitutional: He appears well-developed and well-nourished.  Neck: Neck supple. No thyromegaly present.  Cardiovascular: Normal rate and regular rhythm.   Pulmonary/Chest: Effort normal and breath sounds normal. No respiratory distress. He has no wheezes. He has no rales.  Musculoskeletal: He exhibits no edema.          Assessment & Plan:  #1 recent palpitations and tachycardia. Normal TSH. Per cardiology a low-dose beta blocker. Pulse is improved today around 96.  Paperback caffeine use and avoid decongestants #2 type 2 diabetes. Followed by endocrinology #3 dyslipidemia. Repeat lipid and hepatic panel #4 secondary polycythemia. We've recommended smoking cessation. Repeat CBC.

## 2013-11-06 ENCOUNTER — Encounter: Payer: Self-pay | Admitting: Family Medicine

## 2013-11-08 ENCOUNTER — Encounter: Payer: Self-pay | Admitting: Cardiology

## 2013-11-08 ENCOUNTER — Ambulatory Visit (HOSPITAL_COMMUNITY): Payer: Medicare Other | Attending: Cardiology | Admitting: Cardiology

## 2013-11-08 DIAGNOSIS — R55 Syncope and collapse: Secondary | ICD-10-CM

## 2013-11-08 DIAGNOSIS — J449 Chronic obstructive pulmonary disease, unspecified: Secondary | ICD-10-CM | POA: Insufficient documentation

## 2013-11-08 DIAGNOSIS — Z720 Tobacco use: Secondary | ICD-10-CM

## 2013-11-08 DIAGNOSIS — R002 Palpitations: Secondary | ICD-10-CM | POA: Insufficient documentation

## 2013-11-08 DIAGNOSIS — I10 Essential (primary) hypertension: Secondary | ICD-10-CM | POA: Diagnosis not present

## 2013-11-08 DIAGNOSIS — F17200 Nicotine dependence, unspecified, uncomplicated: Secondary | ICD-10-CM | POA: Diagnosis not present

## 2013-11-08 DIAGNOSIS — I2510 Atherosclerotic heart disease of native coronary artery without angina pectoris: Secondary | ICD-10-CM | POA: Diagnosis not present

## 2013-11-08 DIAGNOSIS — J4489 Other specified chronic obstructive pulmonary disease: Secondary | ICD-10-CM | POA: Insufficient documentation

## 2013-11-08 DIAGNOSIS — E119 Type 2 diabetes mellitus without complications: Secondary | ICD-10-CM | POA: Insufficient documentation

## 2013-11-08 DIAGNOSIS — E785 Hyperlipidemia, unspecified: Secondary | ICD-10-CM | POA: Diagnosis not present

## 2013-11-08 NOTE — Progress Notes (Signed)
Echo performed. 

## 2013-11-23 ENCOUNTER — Telehealth: Payer: Self-pay | Admitting: Pulmonary Disease

## 2013-11-23 NOTE — Telephone Encounter (Signed)
I will forward this message to Dr. Halford Chessman so that he can be aware.

## 2013-11-23 NOTE — Telephone Encounter (Signed)
I believe he travels to [place] during the winter months.  Please inform his DME of this.

## 2013-11-23 NOTE — Telephone Encounter (Signed)
I will send a staff message to [facility] to make her aware of this.

## 2013-11-24 DIAGNOSIS — M719 Bursopathy, unspecified: Secondary | ICD-10-CM | POA: Diagnosis not present

## 2013-11-24 DIAGNOSIS — M67919 Unspecified disorder of synovium and tendon, unspecified shoulder: Secondary | ICD-10-CM | POA: Diagnosis not present

## 2013-11-29 DIAGNOSIS — E669 Obesity, unspecified: Secondary | ICD-10-CM | POA: Diagnosis not present

## 2013-11-29 DIAGNOSIS — M542 Cervicalgia: Secondary | ICD-10-CM | POA: Diagnosis not present

## 2013-11-29 DIAGNOSIS — M47812 Spondylosis without myelopathy or radiculopathy, cervical region: Secondary | ICD-10-CM | POA: Diagnosis not present

## 2013-11-29 DIAGNOSIS — M5030 Other cervical disc degeneration, unspecified cervical region: Secondary | ICD-10-CM | POA: Diagnosis not present

## 2013-12-08 ENCOUNTER — Encounter: Payer: Medicare Other | Attending: Family Medicine

## 2013-12-08 DIAGNOSIS — E119 Type 2 diabetes mellitus without complications: Secondary | ICD-10-CM | POA: Insufficient documentation

## 2013-12-08 DIAGNOSIS — Z713 Dietary counseling and surveillance: Secondary | ICD-10-CM | POA: Insufficient documentation

## 2013-12-09 ENCOUNTER — Ambulatory Visit: Payer: Medicare Other | Admitting: Cardiology

## 2013-12-15 ENCOUNTER — Ambulatory Visit: Payer: Medicare Other

## 2013-12-20 ENCOUNTER — Ambulatory Visit: Payer: Medicare Other | Admitting: Cardiology

## 2013-12-22 ENCOUNTER — Ambulatory Visit: Payer: Medicare Other

## 2014-01-20 ENCOUNTER — Encounter: Payer: Medicare Other | Attending: Family Medicine | Admitting: *Deleted

## 2014-01-20 ENCOUNTER — Encounter: Payer: Self-pay | Admitting: *Deleted

## 2014-01-20 VITALS — Ht 68.0 in | Wt 252.3 lb

## 2014-01-20 DIAGNOSIS — D751 Secondary polycythemia: Secondary | ICD-10-CM | POA: Diagnosis not present

## 2014-01-20 DIAGNOSIS — Z713 Dietary counseling and surveillance: Secondary | ICD-10-CM | POA: Insufficient documentation

## 2014-01-20 DIAGNOSIS — E119 Type 2 diabetes mellitus without complications: Secondary | ICD-10-CM | POA: Insufficient documentation

## 2014-01-20 DIAGNOSIS — IMO0001 Reserved for inherently not codable concepts without codable children: Secondary | ICD-10-CM | POA: Diagnosis not present

## 2014-01-20 NOTE — Progress Notes (Signed)
  Medical Nutrition Therapy:  Appt start time:  1030    end time:  1000. Patient states his last A1c was .0 % on 01/20/14  Assessment:  Primary concerns today: patient here for follow up diabetes education follow up visit. He states his Hgb is too high which impacts the accuracy of his A1c. He states Dr. Chalmers Cater plans to test his average BG with Fructosamine test instead of A1c due to his iron levels.  He is happy with weight loss of 7 pounds in past 3 months. He states he is buying new clothes in smaller sizes, enjoys the healthy food choices he is making and states his BG's are typically within target ranges now. He checked on his insurance coverage for attending the 8 hour Diabetes Core Classes again and he is allowed 3 hours of education per year, so he plans to continue with the one on one visits every 3 months for ongoing support.   TANITA  BODY COMP RESULTS on 07/21/13  Weight 255.5 lb   BMI (kg/m^2) 38.8   Fat Mass (lbs) 97.5 lb   Fat Free Mass (lbs) 158.0 lb   Total Body Water (lbs) 115.5 lb   TANITA  BODY COMP RESULTS on 10/21/13  Weight 257.5 lb   BMI (kg/m^2) 39.2 lb   Fat Mass (lbs) 97.5 lb   Fat Free Mass (lbs) 160.0 lb   Total Body Water (lbs) 117.0 lb   TANITA  BODY COMP RESULTS on 01/20/14  Weight 248.5 lb   BMI (kg/m^2) 37.8   Fat Mass (lbs) 96.0 lb   Fat Free Mass (lbs) 152.5 lb   Total Body Water (lbs) 111.5 lb    Weight loss of 9 pounds in past 3 months! Fat mass is down 1.5 pounds, the rest appears to be fat free mass and water weight today.  MEDICATIONS: see list,    Usual physical activity: walking as able    Intervention: Nutrition counseling and diabetes education continued. Offered him information on the iron content of foods so he can limit them going forward until the high iron problem can be assessed by a specialist. Encouraged him to continue with his current eating habits and activity level Plan:  Continue to aim for 3 Carb Choices per meal (45 grams) +/-  1 either way  Continue to aim for 0-2 Carbs per snack if hungry  Continue reading food labels for Total Carbohydrate and Fat Grams of foods Consider  increasing your activity level by walking after supper on less active days for 20 minutes daily as tolerated Continue checking BG at alternate times per day and recording so we can further assess your BG patterns   Handouts given during visit include: Iron content of foods  Monitoring/Evaluation:  Dietary intake, exercise, reading food labels, and body weight in 3 months.

## 2014-02-01 ENCOUNTER — Encounter: Payer: Self-pay | Admitting: Family Medicine

## 2014-02-01 ENCOUNTER — Ambulatory Visit (INDEPENDENT_AMBULATORY_CARE_PROVIDER_SITE_OTHER): Payer: Medicare Other | Admitting: Family Medicine

## 2014-02-01 ENCOUNTER — Telehealth: Payer: Self-pay | Admitting: Family Medicine

## 2014-02-01 VITALS — BP 118/76 | HR 92 | Temp 98.0°F | Ht 68.0 in | Wt 256.0 lb

## 2014-02-01 DIAGNOSIS — R109 Unspecified abdominal pain: Secondary | ICD-10-CM

## 2014-02-01 DIAGNOSIS — Z23 Encounter for immunization: Secondary | ICD-10-CM | POA: Diagnosis not present

## 2014-02-01 DIAGNOSIS — I10 Essential (primary) hypertension: Secondary | ICD-10-CM

## 2014-02-01 DIAGNOSIS — I2510 Atherosclerotic heart disease of native coronary artery without angina pectoris: Secondary | ICD-10-CM | POA: Diagnosis not present

## 2014-02-01 DIAGNOSIS — Z0000 Encounter for general adult medical examination without abnormal findings: Secondary | ICD-10-CM | POA: Diagnosis not present

## 2014-02-01 DIAGNOSIS — E669 Obesity, unspecified: Secondary | ICD-10-CM | POA: Insufficient documentation

## 2014-02-01 LAB — POCT URINALYSIS DIPSTICK
BILIRUBIN UA: NEGATIVE
Blood, UA: NEGATIVE
Leukocytes, UA: NEGATIVE
NITRITE UA: NEGATIVE
Protein, UA: NEGATIVE
Spec Grav, UA: 1.01
UROBILINOGEN UA: 0.2
pH, UA: 6

## 2014-02-01 NOTE — Progress Notes (Signed)
Pre visit review using our clinic review tool, if applicable. No additional management support is needed unless otherwise documented below in the visit note. 

## 2014-02-01 NOTE — Progress Notes (Signed)
Subjective:    Patient ID: Steve Charles, male    DOB: 05/29/1946, 68 y.o.   MRN: [id number]  HPI Patient here for Medicare wellness exam and medical followup. His chronic problems include history of obesity, ongoing nicotine use, type 2 diabetes, history of TIA, hypertension, dyslipidemia, COPD, obstructive sleep apnea. He has history of mild secondary polycythemia related to smoking. He recently saw his endocrinologist had hemoglobin A1c of 9.0%. Hemoglobin was apparently elevated and he was referred to hematologist. In the past had hemoglobin of around 16.0 (verylikely secondary polycythemia secondary to smoking).  Patient needs Prevnar 13. Other immunizations up-to-date.  He's had some recent mild left flank pain. Duration couple of weeks. No dysuria. No gross hematuria. No fevers or chills. No appetite or weight changes. Symptoms are worse with movement.  Past Medical History  Diagnosis Date  . DYSLIPIDEMIA 11/29/2009  . HYPERTENSION, ESSENTIAL 11/29/2009  . URTICARIA 02/06/2010  . BPH (benign prostatic hypertrophy)   . Osteoarthritis   . DM (diabetes mellitus)   . OSA (obstructive sleep apnea) 03/26/2011    CPAP 9 cm H2O  . COPD (chronic obstructive pulmonary disease) 05/01/2011    PFT 05/01/11>>FEV1 2.07(72%), FEV1% 65, TLC 6.79(111%), DLCO 69%, no BD  . CAD (coronary artery disease)    Past Surgical History  Procedure Laterality Date  . Appendectomy  1963  . Spine surgery      spinal fusion - L4, L5  . Total knee arthroplasty  2007  . Transurethral resection of prostate      bph  . Nasal sinus surgery  1989    reports that he has been smoking Cigarettes.  He has a 50 pack-year smoking history. He has never used smokeless tobacco. He reports that he drinks alcohol. He reports that he does not use illicit drugs. family history includes Arthritis in his other; Cancer in his father; Diabetes in his other; Prostate cancer in his brother. Allergies  Allergen Reactions  .  Codeine Sulfate     REACTION: hives  . Morphine And Related     Oral morphine  . Propoxyphene N-Acetaminophen     REACTION: hives  . Ramipril    1.  Risk factors based on Past Medical , Social, and Family history reviewed and as above 2.  Limitations in physical activities none.  No recent falls. 3.  Depression/mood no depression 4.  Hearing no recent changes 5.  ADLs independent in all 6.  Cognitive function (orientation to time and place, language, writing, speech,memory) no memory deficits.  No language or judgement impairment. 7.  Home Safety no issues identified. 8.  Height, weight, and visual acuity. All stable. 9.  Counseling smoking cessation and weight loss. 10. Recommendation of preventive services. prevnar 13. 11. Labs based on risk factors UA (flank pain as above) 12. Care Plan as above.    Review of Systems  Constitutional: Negative for fever, activity change, appetite change and fatigue.  HENT: Negative for congestion, ear pain and trouble swallowing.   Eyes: Negative for pain and visual disturbance.  Respiratory: Negative for cough, shortness of breath and wheezing.   Cardiovascular: Negative for chest pain and palpitations.  Gastrointestinal: Negative for nausea, vomiting, abdominal pain, diarrhea, constipation, blood in stool, abdominal distention and rectal pain.  Genitourinary: Positive for flank pain. Negative for dysuria, hematuria and testicular pain.  Musculoskeletal: Positive for back pain. Negative for arthralgias and joint swelling.  Skin: Negative for rash.  Neurological: Negative for dizziness, syncope and headaches.  Hematological:  Negative for adenopathy.  Psychiatric/Behavioral: Negative for confusion and dysphoric mood.       Objective:   Physical Exam  Constitutional: He is oriented to person, place, and time. He appears well-developed and well-nourished. No distress.  HENT:  Head: Normocephalic and atraumatic.  Right Ear: External ear  normal.  Left Ear: External ear normal.  Mouth/Throat: Oropharynx is clear and moist.  Eyes: Conjunctivae and EOM are normal. Pupils are equal, round, and reactive to light.  Neck: Normal range of motion. Neck supple. No thyromegaly present.  Cardiovascular: Normal rate, regular rhythm and normal heart sounds.   No murmur heard. Pulmonary/Chest: No respiratory distress. He has no wheezes. He has no rales.  Abdominal: Soft. Bowel sounds are normal. He exhibits no distension and no mass. There is no tenderness. There is no rebound and no guarding.  Musculoskeletal: He exhibits no edema.  Lymphadenopathy:    He has no cervical adenopathy.  Neurological: He is alert and oriented to person, place, and time. He displays normal reflexes. No cranial nerve deficit.  Skin: No rash noted.  Psychiatric: He has a normal mood and affect.          Assessment & Plan:  #1 health maintenance. Prevnar 13 given. Recommended weight loss. Recommend smoking cessation #2 left flank pain. Urine dipstick unremarkable. Suspect musculoskeletal. Avoid nonsteroidals #3 hypertension which is stable and at goal #4 type 2 diabetes. Followed by endocrinology

## 2014-02-01 NOTE — Telephone Encounter (Signed)
Relevant patient education mailed to patient.  

## 2014-02-02 ENCOUNTER — Telehealth: Payer: Self-pay | Admitting: Hematology and Oncology

## 2014-02-02 NOTE — Telephone Encounter (Signed)
S/W PATIENT AND GAVE NEW PATIENT APPT FOR 04/10 @ 10 W/DR. Bramwell.  REFERRING DR. Walthill HGB WELCOME PACKET EMAILED.

## 2014-02-02 NOTE — Telephone Encounter (Signed)
C/D 02/02/14 for appt. 02/10/14

## 2014-02-07 ENCOUNTER — Encounter: Payer: Self-pay | Admitting: Family Medicine

## 2014-02-08 DIAGNOSIS — M5020 Other cervical disc displacement, unspecified cervical region: Secondary | ICD-10-CM | POA: Diagnosis not present

## 2014-02-08 DIAGNOSIS — M5030 Other cervical disc degeneration, unspecified cervical region: Secondary | ICD-10-CM | POA: Diagnosis not present

## 2014-02-08 DIAGNOSIS — Z6838 Body mass index (BMI) 38.0-38.9, adult: Secondary | ICD-10-CM | POA: Diagnosis not present

## 2014-02-08 DIAGNOSIS — M47812 Spondylosis without myelopathy or radiculopathy, cervical region: Secondary | ICD-10-CM | POA: Diagnosis not present

## 2014-02-09 ENCOUNTER — Other Ambulatory Visit: Payer: Self-pay

## 2014-02-10 ENCOUNTER — Encounter: Payer: Self-pay | Admitting: Hematology and Oncology

## 2014-02-10 ENCOUNTER — Ambulatory Visit (HOSPITAL_BASED_OUTPATIENT_CLINIC_OR_DEPARTMENT_OTHER): Payer: Medicare Other

## 2014-02-10 ENCOUNTER — Ambulatory Visit: Payer: Medicare Other

## 2014-02-10 ENCOUNTER — Ambulatory Visit (HOSPITAL_BASED_OUTPATIENT_CLINIC_OR_DEPARTMENT_OTHER): Payer: Medicare Other | Admitting: Hematology and Oncology

## 2014-02-10 ENCOUNTER — Telehealth: Payer: Self-pay | Admitting: Hematology and Oncology

## 2014-02-10 VITALS — BP 109/67 | HR 128 | Temp 97.5°F | Resp 20 | Ht 68.0 in | Wt 252.2 lb

## 2014-02-10 DIAGNOSIS — E785 Hyperlipidemia, unspecified: Secondary | ICD-10-CM | POA: Diagnosis not present

## 2014-02-10 DIAGNOSIS — N401 Enlarged prostate with lower urinary tract symptoms: Secondary | ICD-10-CM | POA: Diagnosis not present

## 2014-02-10 DIAGNOSIS — D751 Secondary polycythemia: Secondary | ICD-10-CM

## 2014-02-10 DIAGNOSIS — I2510 Atherosclerotic heart disease of native coronary artery without angina pectoris: Secondary | ICD-10-CM

## 2014-02-10 DIAGNOSIS — M47812 Spondylosis without myelopathy or radiculopathy, cervical region: Secondary | ICD-10-CM | POA: Diagnosis not present

## 2014-02-10 DIAGNOSIS — M5412 Radiculopathy, cervical region: Secondary | ICD-10-CM | POA: Diagnosis not present

## 2014-02-10 DIAGNOSIS — J449 Chronic obstructive pulmonary disease, unspecified: Secondary | ICD-10-CM

## 2014-02-10 DIAGNOSIS — R109 Unspecified abdominal pain: Secondary | ICD-10-CM | POA: Diagnosis not present

## 2014-02-10 DIAGNOSIS — R55 Syncope and collapse: Secondary | ICD-10-CM | POA: Diagnosis not present

## 2014-02-10 DIAGNOSIS — Z23 Encounter for immunization: Secondary | ICD-10-CM | POA: Diagnosis not present

## 2014-02-10 DIAGNOSIS — R002 Palpitations: Secondary | ICD-10-CM | POA: Diagnosis not present

## 2014-02-10 DIAGNOSIS — F17200 Nicotine dependence, unspecified, uncomplicated: Secondary | ICD-10-CM

## 2014-02-10 DIAGNOSIS — E119 Type 2 diabetes mellitus without complications: Secondary | ICD-10-CM | POA: Diagnosis not present

## 2014-02-10 DIAGNOSIS — D45 Polycythemia vera: Secondary | ICD-10-CM | POA: Diagnosis not present

## 2014-02-10 DIAGNOSIS — Z0000 Encounter for general adult medical examination without abnormal findings: Secondary | ICD-10-CM | POA: Diagnosis not present

## 2014-02-10 DIAGNOSIS — M5030 Other cervical disc degeneration, unspecified cervical region: Secondary | ICD-10-CM | POA: Diagnosis not present

## 2014-02-10 DIAGNOSIS — I10 Essential (primary) hypertension: Secondary | ICD-10-CM | POA: Diagnosis not present

## 2014-02-10 DIAGNOSIS — R51 Headache: Secondary | ICD-10-CM | POA: Diagnosis not present

## 2014-02-10 LAB — CBC WITH DIFFERENTIAL/PLATELET
BASO%: 0.4 % (ref 0.0–2.0)
Basophils Absolute: 0 10*3/uL (ref 0.0–0.1)
EOS ABS: 0.3 10*3/uL (ref 0.0–0.5)
EOS%: 2.6 % (ref 0.0–7.0)
HCT: 53.2 % — ABNORMAL HIGH (ref 38.4–49.9)
HGB: 17.5 g/dL — ABNORMAL HIGH (ref 13.0–17.1)
LYMPH%: 24.8 % (ref 14.0–49.0)
MCH: 30.2 pg (ref 27.2–33.4)
MCHC: 33 g/dL (ref 32.0–36.0)
MCV: 91.5 fL (ref 79.3–98.0)
MONO#: 1.1 10*3/uL — ABNORMAL HIGH (ref 0.1–0.9)
MONO%: 9.7 % (ref 0.0–14.0)
NEUT%: 62.5 % (ref 39.0–75.0)
NEUTROS ABS: 6.9 10*3/uL — AB (ref 1.5–6.5)
Platelets: 275 10*3/uL (ref 140–400)
RBC: 5.81 10*6/uL (ref 4.20–5.82)
RDW: 14.4 % (ref 11.0–14.6)
WBC: 11.1 10*3/uL — ABNORMAL HIGH (ref 4.0–10.3)
lymph#: 2.7 10*3/uL (ref 0.9–3.3)

## 2014-02-10 LAB — TECHNOLOGIST REVIEW

## 2014-02-10 NOTE — Progress Notes (Signed)
Checked in new pt with no financial concerns. °

## 2014-02-10 NOTE — Telephone Encounter (Signed)
gv adn printed appt sched and avs for pt for April...sent to lab

## 2014-02-10 NOTE — Progress Notes (Signed)
[facility] CONSULT NOTE  Patient Care Team: [name], MD as PCP - General Lelon Perla, MD as Referring Physician (Cardiology)  CHIEF COMPLAINTS/PURPOSE OF CONSULTATION:  Severe erythrocytosis  HISTORY OF PRESENTING ILLNESS:  Steve Charles 68 y.o. male is here because of elevated hemoglobin.  The patient was found to have high hemoglobin ranging from 16.8 to 17.2 over the past many years. The patient is a regular blood donor and he has donated over 70 gallons of blood. He usually go to the [employer] and donates blood every 100 days or so Recently, he went to his primary care provider and his blood count came back high at 17.7 with a hematocrit of 54.2. He was complaining of mild headaches, leg cramps but denies any chest pain or shortness of breath. He denies night sweats. Denies skin itching. Denies any abdominal pain.  The patient has been smoking one pack of cigarettes per day for 50 years. He is attempting to quit smoking.  There were no family history of erythrocytosis.  He denies history of blood clots. The patient has history of COPD. MEDICAL HISTORY:  Past Medical History  Diagnosis Date  . DYSLIPIDEMIA 11/29/2009  . HYPERTENSION, ESSENTIAL 11/29/2009  . URTICARIA 02/06/2010  . BPH (benign prostatic hypertrophy)   . Osteoarthritis   . DM (diabetes mellitus)   . OSA (obstructive sleep apnea) 03/26/2011    CPAP 9 cm H2O  . COPD (chronic obstructive pulmonary disease) 05/01/2011    PFT 05/01/11>>FEV1 2.07(72%), FEV1% 65, TLC 6.79(111%), DLCO 69%, no BD  . CAD (coronary artery disease)     SURGICAL HISTORY: Past Surgical History  Procedure Laterality Date  . Appendectomy  1963  . Spine surgery      spinal fusion - L4, L5  . Total knee arthroplasty  2007  . Transurethral resection of prostate      bph  . Nasal sinus surgery  1989    SOCIAL HISTORY: History   Social History  . Marital Status: Married    Spouse Name: N/A    Number of  Children: 3  . Years of Education: N/A   Occupational History  . Retired Other   Social History Main Topics  . Smoking status: Current Every Day Smoker -- 1.00 packs/day for 50 years    Types: Cigarettes  . Smokeless tobacco: Never Used     Comment: pt is again smoking < 1 PPD  . Alcohol Use: Yes     Comment: occasionallly  . Drug Use: No  . Sexual Activity: Not on file   Other Topics Concern  . Not on file   Social History Narrative  . No narrative on file    FAMILY HISTORY: Family History  Problem Relation Age of Onset  . Arthritis Other   . Prostate cancer Brother   . Diabetes Other   . Cancer Father     ALLERGIES:  is allergic to codeine sulfate; morphine and related; propoxyphene n-acetaminophen; and ramipril.  MEDICATIONS:  Current Outpatient Prescriptions  Medication Sig Dispense Refill  . aspirin 81 MG tablet Take 81 mg by mouth daily.        . furosemide (LASIX) 40 MG tablet Take 40 mg by mouth daily as needed.        Marland Kitchen glimepiride (AMARYL) 1 MG tablet Take 1 mg by mouth daily with breakfast.      . INVOKANA 100 MG TABS Take 1 tablet by mouth Daily. per Dr Chalmers Cater (endocrinology)      .  losartan (COZAAR) 25 MG tablet Take 1 tablet (25 mg total) by mouth daily.  90 tablet  3  . metFORMIN (GLUCOPHAGE) 500 MG tablet Take by mouth. 2 pills twice daily       . metoprolol succinate (TOPROL XL) 25 MG 24 hr tablet Take 1 tablet (25 mg total) by mouth daily.  90 tablet  3  . simvastatin (ZOCOR) 20 MG tablet Take 1 tablet (20 mg total) by mouth at bedtime.  90 tablet  3   No current facility-administered medications for this visit.    REVIEW OF SYSTEMS:   Constitutional: Denies fevers, chills or abnormal night sweats Eyes: Denies blurriness of vision, double vision or watery eyes Ears, nose, mouth, throat, and face: Denies mucositis or sore throat Respiratory: Denies cough,or wheezes Cardiovascular: Denies palpitation, chest discomfort or lower extremity  swelling Gastrointestinal:  Denies nausea, heartburn or change in bowel habits Skin: Denies abnormal skin rashes Lymphatics: Denies new lymphadenopathy or easy bruising Neurological:Denies numbness, tingling or new weaknesses Behavioral/Psych: Mood is stable, no new changes  All other systems were reviewed with the patient and are negative.  PHYSICAL EXAMINATION: ECOG PERFORMANCE STATUS: 1 - Symptomatic but completely ambulatory  Filed Vitals:   02/10/14 1010  BP: 109/67  Pulse: 128  Temp: 97.5 F (36.4 C)  Resp: 20   Filed Weights   02/10/14 1010  Weight: 252 lb 3.2 oz (114.397 kg)    GENERAL:alert, no distress and comfortable. He is morbidly obese. He looks plethoric SKIN: skin color, texture, turgor are normal, no rashes or significant lesions EYES: normal, conjunctiva are pink and non-injected, sclera clear OROPHARYNX:no exudate, no erythema and lips, buccal mucosa, and tongue normal  NECK: supple, thyroid normal size, non-tender, without nodularity LYMPH:  no palpable lymphadenopathy in the cervical, axillary or inguinal LUNGS: clear to auscultation and percussion with normal breathing effort HEART: regular rate & rhythm and no murmurs with moderate bilateral lower extremity edema. Chronic venous stasis changes were noted in his legs ABDOMEN:abdomen soft, non-tender and normal bowel sounds. Unable to appreciate hepatosplenomegaly due to morbid obesity Musculoskeletal:no cyanosis of digits and no clubbing  PSYCH: alert & oriented x 3 with fluent speech NEURO: no focal motor/sensory deficits  LABORATORY DATA:  I have reviewed the data as listed Lab Results  Component Value Date   WBC 11.6* 11/02/2013   HGB 16.4 11/02/2013   HCT 48.9 11/02/2013   MCV 90.2 11/02/2013   PLT 291.0 11/02/2013    Recent Labs  06/09/13 0826 11/02/13 1016  NA 138 136  K 4.0 3.9  CL 100 99  CO2 30 30  GLUCOSE 196* 203*  BUN 15 15  CREATININE 0.8 0.8  CALCIUM 9.5 9.3  PROT 7.4 7.5   ALBUMIN 4.1 4.3  AST 17 14  ALT 21 17  ALKPHOS 77 78  BILITOT 0.5 0.7  BILIDIR 0.1 0.0   ASSESSMENT & PLAN:  #1 erythrocytosis #2 history of COPD I suspect the cause of the erythrocytosis is from COPD and chronic smoking. I will order additional workup to rule out myeloproliferative disorder. In the meantime I recommend he continue aspirin therapy to prevent risk of blood clots #3 chronic smoking I spent some time educating the patient importance of nicotine cessation and he will attempt to quit on his own. He has attended nicotine cessation class is recently. Orders Placed This Encounter  Procedures  . CBC with Differential    Standing Status: Future     Number of Occurrences: 1  Standing Expiration Date: 02/10/2015  . Erythropoietin    Standing Status: Future     Number of Occurrences: 1     Standing Expiration Date: 02/10/2015  . JAK2 genotypr    Standing Status: Future     Number of Occurrences: 1     Standing Expiration Date: 02/10/2015    All questions were answered. The patient knows to call the clinic with any problems, questions or concerns. I spent 40 minutes counseling the patient face to face. The total time spent in the appointment was 55 minutes and more than 50% was on counseling.     Heath Lark, MD 02/10/2014 10:51 AM

## 2014-02-13 ENCOUNTER — Ambulatory Visit (INDEPENDENT_AMBULATORY_CARE_PROVIDER_SITE_OTHER): Payer: Medicare Other | Admitting: Cardiology

## 2014-02-13 ENCOUNTER — Encounter: Payer: Self-pay | Admitting: Cardiology

## 2014-02-13 VITALS — BP 110/73 | HR 114 | Ht 68.0 in | Wt 251.0 lb

## 2014-02-13 DIAGNOSIS — I10 Essential (primary) hypertension: Secondary | ICD-10-CM | POA: Diagnosis not present

## 2014-02-13 DIAGNOSIS — F17200 Nicotine dependence, unspecified, uncomplicated: Secondary | ICD-10-CM

## 2014-02-13 DIAGNOSIS — R002 Palpitations: Secondary | ICD-10-CM

## 2014-02-13 DIAGNOSIS — E785 Hyperlipidemia, unspecified: Secondary | ICD-10-CM

## 2014-02-13 DIAGNOSIS — I2510 Atherosclerotic heart disease of native coronary artery without angina pectoris: Secondary | ICD-10-CM | POA: Diagnosis not present

## 2014-02-13 DIAGNOSIS — Z720 Tobacco use: Secondary | ICD-10-CM

## 2014-02-13 LAB — ERYTHROPOIETIN: Erythropoietin: 7.7 m[IU]/mL (ref 2.6–18.5)

## 2014-02-13 NOTE — Assessment & Plan Note (Signed)
Continue aspirin and statin. 

## 2014-02-13 NOTE — Patient Instructions (Signed)
Your physician wants you to follow-up in: 3 MONTHS You will receive a reminder letter in the mail two months in advance. If you don't receive a letter, please call our office to schedule the follow-up appointment.  Your physician has recommended that you wear an event monitor. Event monitors are medical devices that record the heart's electrical activity. Doctors most often [place] these monitors to diagnose arrhythmias. Arrhythmias are problems with the speed or rhythm of the heartbeat. The monitor is a small, portable device. You can wear one while you do your normal daily activities. This is usually used to diagnose what is causing palpitations/syncope (passing out).

## 2014-02-13 NOTE — Assessment & Plan Note (Signed)
Blood pressure controlled. Continue present medications. 

## 2014-02-13 NOTE — Assessment & Plan Note (Signed)
Palpitations have improved but persists. I am hesitant to increase his beta blocker as his blood pressure is borderline. He has had occasional dizzy spells. No frank syncope. We will plan a CardioNet to further assess. Continue present dose of Toprol.

## 2014-02-13 NOTE — Progress Notes (Signed)
HPI: FU CAD, palpitations and dyspnea. Patient states that he has had atrial fibrillation after surgeries in the past but no other cardiac history. Myoview in March 2012 showed extensive sub-diaphragmatic attenuation; EF 58%. Inferior ischemia cannot be excluded. TSH and BNP were normal. Cardionet revealed Sinus with PVCs. Cardiac cath in April of 2012 revealed normal LV function, normal pulmonary pressures and pulmonary capillary wedge pressure and nonobstructive coronary disease with the most significant lesion being a 50% LAD. He was diagnosed with COPD and asthma. Carotid Dopplers in May of 2013 were normal. Echocardiogram repeated in March of 2015. This revealed normal LV function, grade 1 diastolic dysfunction. I added Toprol at last office visit for palpitations. Since then He denies dyspnea on exertion, orthopnea, pedal edema or chest pain. He continues to have occasional palpitations predominantly at night. He also has had several "dizzy spells". These are not positional. No associated palpitations, chest pain or dyspnea. No nausea. He has not had frank syncope.   Current Outpatient Prescriptions  Medication Sig Dispense Refill  . aspirin 81 MG tablet Take 81 mg by mouth daily.        . furosemide (LASIX) 40 MG tablet Take 40 mg by mouth daily as needed.        Marland Kitchen glimepiride (AMARYL) 1 MG tablet Take 1 mg by mouth daily with breakfast.      . INVOKANA 100 MG TABS Take 1 tablet by mouth Daily. per Dr Chalmers Cater (endocrinology)      . losartan (COZAAR) 25 MG tablet Take 1 tablet (25 mg total) by mouth daily.  90 tablet  3  . metFORMIN (GLUCOPHAGE) 500 MG tablet Take by mouth. 2 pills twice daily       . metoprolol succinate (TOPROL XL) 25 MG 24 hr tablet Take 1 tablet (25 mg total) by mouth daily.  90 tablet  3  . simvastatin (ZOCOR) 20 MG tablet Take 1 tablet (20 mg total) by mouth at bedtime.  90 tablet  3   No current facility-administered medications for this visit.     Past Medical  History  Diagnosis Date  . DYSLIPIDEMIA 11/29/2009  . HYPERTENSION, ESSENTIAL 11/29/2009  . URTICARIA 02/06/2010  . BPH (benign prostatic hypertrophy)   . Osteoarthritis   . DM (diabetes mellitus)   . OSA (obstructive sleep apnea) 03/26/2011    CPAP 9 cm H2O  . COPD (chronic obstructive pulmonary disease) 05/01/2011    PFT 05/01/11>>FEV1 2.07(72%), FEV1% 65, TLC 6.79(111%), DLCO 69%, no BD  . CAD (coronary artery disease)     Past Surgical History  Procedure Laterality Date  . Appendectomy  1963  . Spine surgery      spinal fusion - L4, L5  . Total knee arthroplasty  2007  . Transurethral resection of prostate      bph  . Nasal sinus surgery  1989    History   Social History  . Marital Status: Married    Spouse Name: N/A    Number of Children: 3  . Years of Education: N/A   Occupational History  . Retired Other   Social History Main Topics  . Smoking status: Current Every Day Smoker -- 1.00 packs/day for 50 years    Types: Cigarettes  . Smokeless tobacco: Never Used     Comment: pt is again smoking < 1 PPD  . Alcohol Use: Yes     Comment: occasionallly  . Drug Use: No  . Sexual Activity: Not on file  Other Topics Concern  . Not on file   Social History Narrative  . No narrative on file    ROS: no fevers or chills, productive cough, hemoptysis, dysphasia, odynophagia, melena, hematochezia, dysuria, hematuria, rash, seizure activity, orthopnea, PND, pedal edema, claudication. Remaining systems are negative.  Physical Exam: Well-developed well-nourished in no acute distress.  Skin is warm and dry.  HEENT is normal.  Neck is supple.  Chest is clear to auscultation with normal expansion.  Cardiovascular exam is regular rate and rhythm.  Abdominal exam nontender or distended. No masses palpated. Extremities show no edema. neuro grossly intact  ECG Sinus tachycardia at a rate of 114. Left axis deviation.

## 2014-02-13 NOTE — Assessment & Plan Note (Signed)
Continue statin. 

## 2014-02-13 NOTE — Assessment & Plan Note (Signed)
Patient counseled on discontinuing. 

## 2014-02-20 ENCOUNTER — Encounter: Payer: Self-pay | Admitting: *Deleted

## 2014-02-20 ENCOUNTER — Telehealth: Payer: Self-pay | Admitting: Hematology and Oncology

## 2014-02-20 ENCOUNTER — Encounter (INDEPENDENT_AMBULATORY_CARE_PROVIDER_SITE_OTHER): Payer: Medicare Other

## 2014-02-20 DIAGNOSIS — G5620 Lesion of ulnar nerve, unspecified upper limb: Secondary | ICD-10-CM | POA: Diagnosis not present

## 2014-02-20 DIAGNOSIS — M4802 Spinal stenosis, cervical region: Secondary | ICD-10-CM | POA: Diagnosis not present

## 2014-02-20 DIAGNOSIS — R002 Palpitations: Secondary | ICD-10-CM

## 2014-02-20 DIAGNOSIS — M5412 Radiculopathy, cervical region: Secondary | ICD-10-CM | POA: Diagnosis not present

## 2014-02-20 DIAGNOSIS — Z720 Tobacco use: Secondary | ICD-10-CM

## 2014-02-20 DIAGNOSIS — I2510 Atherosclerotic heart disease of native coronary artery without angina pectoris: Secondary | ICD-10-CM

## 2014-02-20 NOTE — Progress Notes (Signed)
Patient ID: Steve Charles, male   DOB: 24-Nov-1945, 68 y.o.   MRN: [id number] E-Cardio verite 30 day cardiac event monitor applied to patient.

## 2014-02-20 NOTE — Telephone Encounter (Signed)
pt called to r/s appt..done...pt aware of new d.t °

## 2014-02-21 ENCOUNTER — Ambulatory Visit (HOSPITAL_BASED_OUTPATIENT_CLINIC_OR_DEPARTMENT_OTHER): Payer: Medicare Other | Admitting: Hematology and Oncology

## 2014-02-21 VITALS — BP 105/62 | HR 91 | Temp 97.9°F | Resp 18 | Ht 68.0 in | Wt 253.9 lb

## 2014-02-21 DIAGNOSIS — D751 Secondary polycythemia: Secondary | ICD-10-CM | POA: Diagnosis not present

## 2014-02-21 DIAGNOSIS — F17200 Nicotine dependence, unspecified, uncomplicated: Secondary | ICD-10-CM

## 2014-02-21 DIAGNOSIS — R002 Palpitations: Secondary | ICD-10-CM | POA: Diagnosis not present

## 2014-02-21 DIAGNOSIS — R55 Syncope and collapse: Secondary | ICD-10-CM | POA: Diagnosis not present

## 2014-02-21 DIAGNOSIS — E119 Type 2 diabetes mellitus without complications: Secondary | ICD-10-CM | POA: Diagnosis not present

## 2014-02-21 DIAGNOSIS — R51 Headache: Secondary | ICD-10-CM | POA: Diagnosis not present

## 2014-02-21 DIAGNOSIS — R109 Unspecified abdominal pain: Secondary | ICD-10-CM | POA: Diagnosis not present

## 2014-02-21 DIAGNOSIS — E785 Hyperlipidemia, unspecified: Secondary | ICD-10-CM | POA: Diagnosis not present

## 2014-02-21 DIAGNOSIS — Z720 Tobacco use: Secondary | ICD-10-CM

## 2014-02-21 DIAGNOSIS — I2510 Atherosclerotic heart disease of native coronary artery without angina pectoris: Secondary | ICD-10-CM | POA: Diagnosis not present

## 2014-02-21 DIAGNOSIS — I10 Essential (primary) hypertension: Secondary | ICD-10-CM | POA: Diagnosis not present

## 2014-02-21 DIAGNOSIS — Z0000 Encounter for general adult medical examination without abnormal findings: Secondary | ICD-10-CM | POA: Diagnosis not present

## 2014-02-21 DIAGNOSIS — D72829 Elevated white blood cell count, unspecified: Secondary | ICD-10-CM

## 2014-02-21 DIAGNOSIS — N401 Enlarged prostate with lower urinary tract symptoms: Secondary | ICD-10-CM | POA: Diagnosis not present

## 2014-02-21 DIAGNOSIS — J449 Chronic obstructive pulmonary disease, unspecified: Secondary | ICD-10-CM | POA: Diagnosis not present

## 2014-02-21 NOTE — Progress Notes (Signed)
[place] NOTE  Steve Post, MD DIAGNOSIS:  Chronic leukocytosis and erythrocytosis  SUMMARY OF HEMATOLOGIC HISTORY: The patient was found to have high hemoglobin ranging from 16.8 to 17.2 over the past many years. The patient is a regular blood donor and he has donated over 70 gallons of blood. He usually go to the [employer] and donates blood every 100 days or so Recently, he went to his primary care provider and his blood count came back high at 17.7 with a hematocrit of 54.2. He was complaining of mild headaches, leg cramps but denies any chest pain or shortness of breath. He denies night sweats. Denies skin itching. Denies any abdominal pain.  The patient has been smoking one pack of cigarettes per day for 50 years. He is attempting to quit smoking.  There were no family history of erythrocytosis.  He denies history of blood clots. The patient has history of COPD. INTERVAL HISTORY: Steve Charles 68 y.o. male returns for further followup. He continues to donate blood regularly. He is attempting to quit smoking.  I have reviewed the past medical history, past surgical history, social history and family history with the patient and they are unchanged from previous note.  ALLERGIES:  is allergic to codeine sulfate; morphine and related; propoxyphene n-acetaminophen; and ramipril.  MEDICATIONS:  Current Outpatient Prescriptions  Medication Sig Dispense Refill  . aspirin 81 MG tablet Take 81 mg by mouth daily.        . furosemide (LASIX) 40 MG tablet Take 40 mg by mouth daily as needed.        Marland Kitchen glimepiride (AMARYL) 1 MG tablet Take 1 mg by mouth daily with breakfast.      . INVOKANA 100 MG TABS Take 1 tablet by mouth Daily. per Dr Chalmers Cater (endocrinology)      . losartan (COZAAR) 25 MG tablet Take 1 tablet (25 mg total) by mouth daily.  90 tablet  3  . metFORMIN (GLUCOPHAGE) 500 MG tablet Take by mouth. 2 pills twice daily       . metoprolol  succinate (TOPROL XL) 25 MG 24 hr tablet Take 1 tablet (25 mg total) by mouth daily.  90 tablet  3  . simvastatin (ZOCOR) 20 MG tablet Take 1 tablet (20 mg total) by mouth at bedtime.  90 tablet  3   No current facility-administered medications for this visit.     REVIEW OF SYSTEMS:   All other systems were reviewed with the patient and are negative.  PHYSICAL EXAMINATION: ECOG PERFORMANCE STATUS: 0 - Asymptomatic  Filed Vitals:   02/21/14 1400  BP: 105/62  Pulse: 91  Temp: 97.9 F (36.6 C)  Resp: 18   Filed Weights   02/21/14 1400  Weight: 253 lb 14.4 oz (115.168 kg)    GENERAL:alert, no distress and comfortable. He looks plethoric. Mildly obese. NEURO: alert & oriented x 3 with fluent speech, no focal motor/sensory deficits  LABORATORY DATA:  I have reviewed the data as listed No results found for this or any previous visit (from the past 48 hour(s)).  Lab Results  Component Value Date   WBC 11.1* 02/10/2014   HGB 17.5* 02/10/2014   HCT 53.2* 02/10/2014   MCV 91.5 02/10/2014   PLT 275 02/10/2014   ASSESSMENT & PLAN:  #1 erythrocytosis #2 mild leukocytosis Overall presentation is compatible with chronic tobacco abuse. I recommend the patient to continue on aspirin. Peripheral blood for erythropoietin level and JAK2 mutation were  negative, and I do not have a clinical suspicion for myeloproliferative disorder. I recommend the patient to continue his nicotine cessation effort. I have not make a return appointment for the patient to come back. All questions were answered. The patient knows to call the clinic with any problems, questions or concerns. No barriers to learning was detected.  I spent 15 minutes counseling the patient face to face. The total time spent in the appointment was 20 minutes and more than 50% was on counseling.     Heath Lark, MD 02/21/2014 2:24 PM

## 2014-02-22 ENCOUNTER — Ambulatory Visit: Payer: Medicare Other | Admitting: Hematology and Oncology

## 2014-02-27 DIAGNOSIS — M4802 Spinal stenosis, cervical region: Secondary | ICD-10-CM | POA: Diagnosis not present

## 2014-02-27 DIAGNOSIS — M5020 Other cervical disc displacement, unspecified cervical region: Secondary | ICD-10-CM | POA: Diagnosis not present

## 2014-02-27 DIAGNOSIS — M5030 Other cervical disc degeneration, unspecified cervical region: Secondary | ICD-10-CM | POA: Diagnosis not present

## 2014-02-27 DIAGNOSIS — M47812 Spondylosis without myelopathy or radiculopathy, cervical region: Secondary | ICD-10-CM | POA: Diagnosis not present

## 2014-03-13 ENCOUNTER — Telehealth: Payer: Self-pay | Admitting: *Deleted

## 2014-03-13 DIAGNOSIS — I10 Essential (primary) hypertension: Secondary | ICD-10-CM

## 2014-03-13 DIAGNOSIS — I2510 Atherosclerotic heart disease of native coronary artery without angina pectoris: Secondary | ICD-10-CM

## 2014-03-13 DIAGNOSIS — R55 Syncope and collapse: Secondary | ICD-10-CM

## 2014-03-13 NOTE — Telephone Encounter (Addendum)
This am I received E cardio strips from 5/8 230 am that showed sinus tachycardia VS SVT rate of 165. Patient states he was asleep at the time. Reviewed with Dr.Turner (DOD) and she advised that he send in another rhythm strip today. He sent another at 950 am and this pm. I was only able to obtain the AM strip from [place] which showed a heart rate of 95. Advised patient, will show rhythm strips to Dr.Crenshaw on 5/13 and look for today's pm transmission on 5/12.

## 2014-03-14 ENCOUNTER — Encounter: Payer: Self-pay | Admitting: Cardiology

## 2014-03-20 NOTE — Telephone Encounter (Signed)
eCardio alerts reviewed by dr Stanford Breed, pt is to stop cozaar. Change toprol to 50 mg once daily Pt will need a non-urgent follow up appt   Left message for pt to call

## 2014-03-22 NOTE — Telephone Encounter (Signed)
Left message for pt to call.

## 2014-03-23 ENCOUNTER — Encounter: Payer: Self-pay | Admitting: Cardiology

## 2014-03-23 MED ORDER — METOPROLOL SUCCINATE ER 50 MG PO TB24: 50.0000 mg | ORAL_TABLET | Freq: Every day | ORAL | Status: DC

## 2014-03-23 NOTE — Telephone Encounter (Signed)
This encounter was created in error - please disregard.

## 2014-03-23 NOTE — Telephone Encounter (Signed)
Spoke with pt, Patient voiced understanding of medication changes Follow up scheduled

## 2014-03-23 NOTE — Telephone Encounter (Signed)
New message ° ° ° °Returning Debra's call °

## 2014-03-29 DIAGNOSIS — G5600 Carpal tunnel syndrome, unspecified upper limb: Secondary | ICD-10-CM | POA: Diagnosis not present

## 2014-03-29 DIAGNOSIS — G5620 Lesion of ulnar nerve, unspecified upper limb: Secondary | ICD-10-CM | POA: Diagnosis not present

## 2014-03-29 DIAGNOSIS — M5020 Other cervical disc displacement, unspecified cervical region: Secondary | ICD-10-CM | POA: Diagnosis not present

## 2014-03-29 DIAGNOSIS — M5030 Other cervical disc degeneration, unspecified cervical region: Secondary | ICD-10-CM | POA: Diagnosis not present

## 2014-03-31 ENCOUNTER — Telehealth: Payer: Self-pay | Admitting: *Deleted

## 2014-03-31 NOTE — Telephone Encounter (Signed)
Monitor reviewed by dr Stanford Breed shows sinus with pac's, pvc's and SVT Pt reports he is having less episodes since starting the metoprolol but they are still occuring daily His blood pressure is running low. He has a follow up in July  Will forward for dr Stanford Breed review

## 2014-03-31 NOTE — Telephone Encounter (Signed)
Schedule fu ov with PA; may need EP eval Steve Charles

## 2014-03-31 NOTE — Telephone Encounter (Signed)
DC cozaar and increase toprol to 50 mg daily Steve Charles

## 2014-03-31 NOTE — Telephone Encounter (Signed)
Spoke with pt, Follow up scheduled  

## 2014-03-31 NOTE — Telephone Encounter (Signed)
After review of alert monitor strips, he is currently taking metoprolol 50 mg and has stopped the losartan

## 2014-04-12 DIAGNOSIS — G5620 Lesion of ulnar nerve, unspecified upper limb: Secondary | ICD-10-CM | POA: Diagnosis not present

## 2014-04-12 DIAGNOSIS — G5600 Carpal tunnel syndrome, unspecified upper limb: Secondary | ICD-10-CM | POA: Diagnosis not present

## 2014-04-18 ENCOUNTER — Ambulatory Visit (INDEPENDENT_AMBULATORY_CARE_PROVIDER_SITE_OTHER): Payer: Medicare Other | Admitting: Physician Assistant

## 2014-04-18 ENCOUNTER — Encounter: Payer: Self-pay | Admitting: Physician Assistant

## 2014-04-18 VITALS — BP 107/68 | HR 114 | Ht 68.0 in | Wt 251.0 lb

## 2014-04-18 DIAGNOSIS — I10 Essential (primary) hypertension: Secondary | ICD-10-CM | POA: Diagnosis not present

## 2014-04-18 DIAGNOSIS — I2510 Atherosclerotic heart disease of native coronary artery without angina pectoris: Secondary | ICD-10-CM | POA: Diagnosis not present

## 2014-04-18 DIAGNOSIS — I471 Supraventricular tachycardia: Secondary | ICD-10-CM

## 2014-04-18 DIAGNOSIS — I498 Other specified cardiac arrhythmias: Secondary | ICD-10-CM

## 2014-04-18 DIAGNOSIS — E785 Hyperlipidemia, unspecified: Secondary | ICD-10-CM | POA: Diagnosis not present

## 2014-04-18 LAB — BASIC METABOLIC PANEL
BUN: 16 mg/dL (ref 6–23)
CALCIUM: 9.5 mg/dL (ref 8.4–10.5)
CO2: 29 meq/L (ref 19–32)
CREATININE: 0.9 mg/dL (ref 0.4–1.5)
Chloride: 95 mEq/L — ABNORMAL LOW (ref 96–112)
GFR: 88.87 mL/min (ref >60.00)
GLUCOSE: 251 mg/dL — AB (ref 70–99)
Potassium: 3.8 mEq/L (ref 3.5–5.1)
Sodium: 136 mEq/L (ref 135–145)

## 2014-04-18 LAB — TSH: TSH: 0.45 u[IU]/mL (ref 0.35–4.50)

## 2014-04-18 MED ORDER — METOPROLOL SUCCINATE ER 50 MG PO TB24: 50.0000 mg | ORAL_TABLET | Freq: Two times a day (BID) | ORAL | Status: DC

## 2014-04-18 NOTE — Patient Instructions (Signed)
INCREASE TOPROL XL TO 50 MG TWICE DAILY; NEW RX SENT IN TODAY  LAB WORK TODAY; BMET, TSH  You have been referred to ELECTROPHYSIOLOGY DX SVT; DR. Caryl Comes, DR. [place]

## 2014-04-18 NOTE — Progress Notes (Signed)
Cardiology Office Note   Date:  04/18/2014   ID:  KEVONTA PHARISS, DOB October 05, 1946, MRN [id number]  PCP:  [name], MD  Cardiologist:  Dr. Kirk Ruths      History of Present Illness: Steve Charles is a 68 y.o. male with a history of nonobstructive CAD, COPD/asthma, tobacco abuse, palpitations and a reported history of atrial fibrillation, diabetes.  Last seen by Dr. Stanford Breed 02/2014. Patient complained of palpitations and an event monitor was placed.  Event monitor has apparently demonstrated SVT. He was asked to followup today. Of note, Toprol was changed to 50 mg daily.  Losartan was stopped.    The patient notes that he has had these palpitations for several years now. They've become more frequent over the last several months. He denies any associated symptoms. He typically feels it in his throat. He feels a rapid heartbeat. He denies chest pain, shortness of breath, syncope, orthopnea, PND or significant pedal edema.   Studies:  - LHC (02/2011):  Proximal LAD 25%, mid LAD 50%, distal LAD 30%, EF 60%.  - Echo (11/2013):  EF 55-60%, normal wall motion, grade 1 diastolic dysfunction  - Nuclear (01/2011):  EF 58%, inferior perfusion defect (attenuation versus ischemia)  - Carotid US (03/2012):  No ICA stenosis bilaterally   Recent Labs: 10/28/2013: TSH 0.57  11/02/2013: ALT 17; Creatinine 0.8; HDL Cholesterol by NMR 33.60*; LDL (calc) 99; Potassium 3.9  02/10/2014: Hemoglobin 17.5*   Wt Readings from Last 3 Encounters:  04/18/14 251 lb (113.853 kg)  02/21/14 253 lb 14.4 oz (115.168 kg)  02/13/14 251 lb (113.853 kg)     Past Medical History  Diagnosis Date  . DYSLIPIDEMIA 11/29/2009  . HYPERTENSION, ESSENTIAL 11/29/2009  . URTICARIA 02/06/2010  . BPH (benign prostatic hypertrophy)   . Osteoarthritis   . DM (diabetes mellitus)   . OSA (obstructive sleep apnea) 03/26/2011    CPAP 9 cm H2O  . COPD (chronic obstructive pulmonary disease) 05/01/2011    PFT  05/01/11>>FEV1 2.07(72%), FEV1% 65, TLC 6.79(111%), DLCO 69%, no BD  . CAD (coronary artery disease)     Current Outpatient Prescriptions  Medication Sig Dispense Refill  . aspirin 81 MG tablet Take 81 mg by mouth daily.        . CHROMIUM-CINNAMON PO Take 2,000 mg by mouth daily.      . furosemide (LASIX) 40 MG tablet Take 40 mg by mouth daily as needed.        Marland Kitchen glimepiride (AMARYL) 1 MG tablet Take 1 mg by mouth daily with breakfast.      . INVOKANA 100 MG TABS Take 1 tablet by mouth Daily. per Dr Chalmers Cater (endocrinology)      . metFORMIN (GLUCOPHAGE) 500 MG tablet Take by mouth. 2 pills twice daily       . metoprolol succinate (TOPROL XL) 50 MG 24 hr tablet Take 1 tablet (50 mg total) by mouth daily.  90 tablet  3  . simvastatin (ZOCOR) 20 MG tablet Take 1 tablet (20 mg total) by mouth at bedtime.  90 tablet  3  . Turmeric 450 MG CAPS Take 450 mg by mouth daily.       No current facility-administered medications for this visit.    Allergies:   Codeine sulfate; Morphine and related; Propoxyphene n-acetaminophen; and Ramipril   Social History:  The patient  reports that he has been smoking Cigarettes.  He has a 50 pack-year smoking history. He has never used smokeless tobacco. He  reports that he drinks alcohol. He reports that he does not use illicit drugs.   Family History:  The patient's family history includes Arthritis in his other; Cancer in his father; Diabetes in his other; Prostate cancer in his brother.   ROS:  Please see the history of present illness.      All other systems reviewed and negative.   PHYSICAL EXAM: VS:  BP 107/68  Pulse 114  Ht 5\' 8"  (1.727 m)  Wt 251 lb (113.853 kg)  BMI 38.17 kg/m2 Well nourished, well developed, in no acute distress HEENT: normal Neck: no JVD Cardiac:  normal S1, S2; rapid regular rhythm that was brief then returned to RRR; no murmur Lungs:  clear to auscultation bilaterally, no wheezing, rhonchi or rales Abd: soft, nontender, no  hepatomegaly Ext: no edema Skin: warm and dry Neuro:  CNs 2-12 intact, no focal abnormalities noted  EKG:  Sinus tachycardia, HR 114     ASSESSMENT AND PLAN:  1. SVT (supraventricular tachycardia):  I reviewed his event monitor today. He has had episodes of sinus tachycardia as well as SVT. Question if this could be atrial flutter. He had a brief episode while I was examining him today. His baseline heart rate is elevated. I will increase his Toprol to 50 mg twice a day. I will obtain a basic metabolic panel and TSH today. Refer to EP for further evaluation. 2. CAD (coronary artery disease):  No angina. Continue aspirin, statin. 3. HYPERTENSION, ESSENTIAL:  Controlled. 4. Other and unspecified hyperlipidemia:  Continue statin. 5. Disposition: Refer to EP.   Signed, Versie Starks, MHS 04/18/2014 3:04 PM    [facility] [place], [place], [place]  [address] Phone: [phone]; Fax: [phone]

## 2014-04-19 ENCOUNTER — Telehealth: Payer: Self-pay | Admitting: Physician Assistant

## 2014-04-19 NOTE — Telephone Encounter (Signed)
New message  Pt called// believed the call was for lab reports please assist

## 2014-04-27 DIAGNOSIS — IMO0001 Reserved for inherently not codable concepts without codable children: Secondary | ICD-10-CM | POA: Diagnosis not present

## 2014-05-03 ENCOUNTER — Encounter: Payer: Medicare Other | Attending: Endocrinology | Admitting: *Deleted

## 2014-05-03 ENCOUNTER — Encounter: Payer: Self-pay | Admitting: Family Medicine

## 2014-05-03 VITALS — Ht 68.0 in | Wt 250.7 lb

## 2014-05-03 DIAGNOSIS — Z713 Dietary counseling and surveillance: Secondary | ICD-10-CM | POA: Diagnosis not present

## 2014-05-03 DIAGNOSIS — E119 Type 2 diabetes mellitus without complications: Secondary | ICD-10-CM | POA: Insufficient documentation

## 2014-05-03 NOTE — Progress Notes (Signed)
  Medical Nutrition Therapy:  Appt start time:  1500    end time:  4540. Patient states his last A1c was 6.7 % on 01/20/14  Assessment:  Primary concerns today: patient here for follow up diabetes education follow up visit. He is very happy with A1c of 6.7% . He states recent carpal tunnel surgery. Continues to SMBG daily and states all numbers are within target ranges. Continues to walk regularly and that he feels good. Appreciates the accountability by coming every 3 months which his insurance will cover @ 3 hours education per year.   TANITA  BODY COMP RESULTS on 07/21/13  Weight 255.5 lb   BMI (kg/m^2) 38.8   Fat Mass (lbs) 97.5 lb   Fat Free Mass (lbs) 158.0 lb   Total Body Water (lbs) 115.5 lb   TANITA  BODY COMP RESULTS on 10/21/13  Weight 257.5 lb   BMI (kg/m^2) 39.2 lb   Fat Mass (lbs) 97.5 lb   Fat Free Mass (lbs) 160.0 lb   Total Body Water (lbs) 117.0 lb   TANITA  BODY COMP RESULTS on 01/20/14  Weight 248.5 lb   BMI (kg/m^2) 37.8   Fat Mass (lbs) 96.0 lb   Fat Free Mass (lbs) 152.5 lb   Total Body Water (lbs) 111.5 lb   TANITA  BODY COMP RESULTS  Weight 250.0 lb   BMI (kg/m^2) 38.0   Fat Mass (lbs) 100.0 lb   Fat Free Mass (lbs) 150.0 lb   Total Body Water (lbs) 110.0 lb   MEDICATIONS: see list,    Usual physical activity: walking as able    Intervention: Nutrition counseling and diabetes education continued. Encouraged him to continue with his current eating habits and activity level Plan:  Continue to aim for 3 Carb Choices per meal (45 grams) +/- 1 either way  Continue to aim for 0-2 Carbs per snack if hungry  Continue reading food labels for Total Carbohydrate and Fat Grams of foods Continue with your activity level by walking after supper on less active days for 20 minutes daily as tolerated Continue checking BG at alternate times per day on occasion     Handouts given during visit include: Tanita Scale results handout  Monitoring/Evaluation:  Dietary  intake, exercise, reading food labels, and body weight in 3 months.

## 2014-05-03 NOTE — Patient Instructions (Signed)
Plan:  Continue to aim for 3 Carb Choices per meal (45 grams) +/- 1 either way  Continue to aim for 0-2 Carbs per snack if hungry  Continue reading food labels for Total Carbohydrate and Fat Grams of foods Continue with your activity level by walking after supper on less active days for 20 minutes daily as tolerated Continue checking BG at alternate times per day on occasion

## 2014-05-04 NOTE — Telephone Encounter (Signed)
Called pt.  He will set up appt.  He has several questions regarding meds.

## 2014-05-09 ENCOUNTER — Ambulatory Visit (INDEPENDENT_AMBULATORY_CARE_PROVIDER_SITE_OTHER): Payer: Medicare Other | Admitting: Internal Medicine

## 2014-05-09 ENCOUNTER — Encounter: Payer: Self-pay | Admitting: Internal Medicine

## 2014-05-09 VITALS — BP 124/78 | HR 96 | Ht 68.25 in | Wt 246.0 lb

## 2014-05-09 DIAGNOSIS — I471 Supraventricular tachycardia: Secondary | ICD-10-CM

## 2014-05-09 DIAGNOSIS — I498 Other specified cardiac arrhythmias: Secondary | ICD-10-CM

## 2014-05-09 NOTE — Patient Instructions (Signed)
Your physician recommends that you schedule a follow-up appointment  As needed with Dr. Lovena Le.  Your physician recommends that you continue on your current medications as directed. Please refer to the Current Medication list given to you today.

## 2014-05-09 NOTE — Assessment & Plan Note (Signed)
The patient has both SVT (which could be AVNRT or atrial tachy) as well as PVC's and PAC's on cardiac monitor as well. He is improved on the higher dose of beta blocker and I have recommended a period of watchful waiting. If his SVT were to worsen then we would recommend catheter ablation. I have informed him that he may take additional doses of his beta blocker.

## 2014-05-09 NOTE — Progress Notes (Signed)
HPI [name] is referred today by Richardson Dopp for evaluation of SVT. The patient is a pleasant 68 yo man with a h/o palpitations who wore a cardiac monitor several weeks ago which demonstrated PVC', PAC's and SVT at rates of up to 160/min. He has been placed on beta blockers and had his does uptitrated. He notes that his symptoms have improved and he denies syncope. No edema. He has not had any trouble with his beta blocker and his resting HR remains on the high side. When I reviewed his monitor I had the impression that his SVT was not particularly sustained. He has tried to moderate ETOH and caffeine intake. The patient has had morbid obesity and has lost approx. 80 lbs. His goal is to lose another 50 lbs. He denies the use of any stimulants.  Allergies  Allergen Reactions  . Codeine Sulfate     REACTION: hives  . Morphine And Related     Oral morphine  . Propoxyphene N-Acetaminophen     REACTION: hives  . Ramipril      Current Outpatient Prescriptions  Medication Sig Dispense Refill  . aspirin 81 MG tablet Take 81 mg by mouth daily.        . CHROMIUM-CINNAMON PO Take 2,000 mg by mouth daily.      . furosemide (LASIX) 40 MG tablet Take 40 mg by mouth daily.       Marland Kitchen glimepiride (AMARYL) 1 MG tablet Take 1 mg by mouth daily with breakfast.      . INVOKANA 100 MG TABS Take 1 tablet by mouth Daily. per Dr Chalmers Cater (endocrinology)      . metFORMIN (GLUCOPHAGE) 500 MG tablet Take 2 tablets twice a day      . metoprolol succinate (TOPROL-XL) 50 MG 24 hr tablet Take 1 tablet (50 mg total) by mouth 2 (two) times daily.  180 tablet  3  . simvastatin (ZOCOR) 20 MG tablet Take 1 tablet (20 mg total) by mouth at bedtime.  90 tablet  3  . Turmeric 450 MG CAPS Take 450 mg by mouth daily.       No current facility-administered medications for this visit.     Past Medical History  Diagnosis Date  . DYSLIPIDEMIA 11/29/2009  . HYPERTENSION, ESSENTIAL 11/29/2009  . URTICARIA 02/06/2010  .  BPH (benign prostatic hypertrophy)   . Osteoarthritis   . DM (diabetes mellitus)   . OSA (obstructive sleep apnea) 03/26/2011    CPAP 9 cm H2O  . COPD (chronic obstructive pulmonary disease) 05/01/2011    PFT 05/01/11>>FEV1 2.07(72%), FEV1% 65, TLC 6.79(111%), DLCO 69%, no BD  . CAD (coronary artery disease)     ROS:   All systems reviewed and negative except as noted in the HPI.   Past Surgical History  Procedure Laterality Date  . Appendectomy  1963  . Spine surgery      spinal fusion - L4, L5  . Total knee arthroplasty  2007  . Transurethral resection of prostate      bph  . Nasal sinus surgery  1989     Family History  Problem Relation Age of Onset  . Arthritis Other   . Prostate cancer Brother   . Diabetes Other   . Cancer Father      History   Social History  . Marital Status: Married    Spouse Name: N/A    Number of Children: 3  . Years of Education: N/A  Occupational History  . Retired Other   Social History Main Topics  . Smoking status: Current Every Day Smoker -- 1.00 packs/day for 50 years    Types: Cigarettes  . Smokeless tobacco: Never Used     Comment: pt is again smoking < 1 PPD  . Alcohol Use: Yes     Comment: occasionallly  . Drug Use: No  . Sexual Activity: Not on file   Other Topics Concern  . Not on file   Social History Narrative  . No narrative on file     BP 124/78  Pulse 96  Ht 5' 8.25" (1.734 m)  Wt 246 lb (111.585 kg)  BMI 37.11 kg/m2  Physical Exam:  Well appearing obese 69 yo man, NAD HEENT: Unremarkable Neck:  No JVD, no thyromegally Back:  No CVA tenderness Lungs:  Clear with no wheezes or rales HEART:  Regular rate rhythm, no murmurs, no rubs, no clicks Abd:  soft, non-tender, positive bowel sounds, no organomegally, no rebound, no guarding Ext:  2 plus pulses, no edema, no cyanosis, no clubbing Skin:  No rashes no nodules Neuro:  CN II through XII intact, motor grossly intact  EKG -  nsr  Assess/Plan:

## 2014-05-12 ENCOUNTER — Encounter: Payer: Self-pay | Admitting: Nurse Practitioner

## 2014-05-12 ENCOUNTER — Ambulatory Visit (INDEPENDENT_AMBULATORY_CARE_PROVIDER_SITE_OTHER): Payer: Medicare Other | Admitting: Nurse Practitioner

## 2014-05-12 VITALS — BP 120/74 | HR 102 | Ht 68.0 in | Wt 253.8 lb

## 2014-05-12 DIAGNOSIS — I498 Other specified cardiac arrhythmias: Secondary | ICD-10-CM

## 2014-05-12 DIAGNOSIS — I259 Chronic ischemic heart disease, unspecified: Secondary | ICD-10-CM | POA: Diagnosis not present

## 2014-05-12 DIAGNOSIS — I471 Supraventricular tachycardia: Secondary | ICD-10-CM

## 2014-05-12 NOTE — Patient Instructions (Signed)
You may take an extra 50 mg of Toprol when you get home -  You may take an extra 50 mg of Toprol for recurrent episodes.  We will arrange for a stress test (Lexiscan)  See Dr. Stanford Breed as planned next week  Call the [place] office at [phone] if you have any questions, problems or concerns.

## 2014-05-12 NOTE — Progress Notes (Signed)
Steve Charles Date of Birth: 11/25/1945 Medical Record #[id number]  History of Present Illness: Steve Charles is seen back today for a work in visit. Seen for Dr. Lovena Le and Stanford Breed. He is a 68 y.o. male with a history of nonobstructive CAD, COPD/asthma, tobacco abuse, palpitations and a reported history of atrial fibrillation, diabetes. Last seen by Dr. Stanford Breed 02/2014. Patient complained of palpitations and an event monitor was placed. Event monitor has apparently demonstrated SVT.  Toprol was changed to 50 mg daily and has been subsequently increased. Losartan was stopped.   Studies:  - LHC (02/2011): Proximal LAD 25%, mid LAD 50%, distal LAD 30%, EF 60%.  - Echo (11/2013): EF 55-60%, normal wall motion, grade 1 diastolic dysfunction  - Nuclear (01/2011): EF 58%, inferior perfusion defect (attenuation versus ischemia)  - Carotid US (03/2012): No ICA stenosis bilaterally   Saw Richardson Dopp in June. Dr. Lovena Le just earlier this week. To see Dr. Stanford Breed early next week.   At his visit with Dr. Lovena Le he was noted to have PVC, PACs and SVT on his monitor report. The SVT could be AVNRT or atrial tachy - he was felt to be improved on beta blocker therapy and they elected to just monitor and he was advised to take extra beta blocker prn. Could consider an ablation. He has had moderate ETOH and caffeine use.  Called today and wished to be seen for palpitations.   Comes in today. Here with his wife. Says he woke up this morning with a HR of 160. Almost fainted this morning. Very lightheaded. Feels "like a cold wave washed over him". Better now. HR 100 at OV. Did not take any extra medicine. Did not understand how to "take extra". No chest pain. BP typically runs low normal. He is not really wanting to have an ablation. He is smoking. Remains diabetic and is obese.    Current Outpatient Prescriptions  Medication Sig Dispense Refill  . aspirin 81 MG tablet Take 81 mg by mouth daily.        .  CHROMIUM-CINNAMON PO Take 2,000 mg by mouth daily.      . furosemide (LASIX) 40 MG tablet Take 40 mg by mouth daily.       Marland Kitchen glimepiride (AMARYL) 1 MG tablet Take 1 mg by mouth daily with breakfast.      . INVOKANA 100 MG TABS Take 1 tablet by mouth Daily. per Dr Chalmers Cater (endocrinology)      . metFORMIN (GLUCOPHAGE) 500 MG tablet Take 2 tablets twice a day      . metoprolol succinate (TOPROL-XL) 50 MG 24 hr tablet Take 1 tablet (50 mg total) by mouth 2 (two) times daily.  180 tablet  3  . Turmeric 450 MG CAPS Take 450 mg by mouth daily.      . simvastatin (ZOCOR) 20 MG tablet Take 1 tablet (20 mg total) by mouth at bedtime.  90 tablet  3   No current facility-administered medications for this visit.    Allergies  Allergen Reactions  . Codeine Sulfate     REACTION: hives  . Morphine And Related     Oral morphine  . Propoxyphene N-Acetaminophen     REACTION: hives  . Ramipril     Past Medical History  Diagnosis Date  . DYSLIPIDEMIA 11/29/2009  . HYPERTENSION, ESSENTIAL 11/29/2009  . URTICARIA 02/06/2010  . BPH (benign prostatic hypertrophy)   . Osteoarthritis   . DM (diabetes mellitus)   . OSA (obstructive  sleep apnea) 03/26/2011    CPAP 9 cm H2O  . COPD (chronic obstructive pulmonary disease) 05/01/2011    PFT 05/01/11>>FEV1 2.07(72%), FEV1% 65, TLC 6.79(111%), DLCO 69%, no BD  . CAD (coronary artery disease)     Past Surgical History  Procedure Laterality Date  . Appendectomy  1963  . Spine surgery      spinal fusion - L4, L5  . Total knee arthroplasty  2007  . Transurethral resection of prostate      bph  . Nasal sinus surgery  1989    History  Smoking status  . Current Every Day Smoker -- 1.00 packs/day for 50 years  . Types: Cigarettes  Smokeless tobacco  . Never Used    Comment: pt is again smoking < 1 PPD    History  Alcohol Use  . Yes    Comment: occasionallly    Family History  Problem Relation Age of Onset  . Arthritis Other   . Prostate cancer  Brother   . Diabetes Other   . Cancer Father     Review of Systems: The review of systems is per the HPI.  All other systems were reviewed and are negative.  Physical Exam: BP 120/74  Pulse 102  Ht 5\' 8"  (1.727 m)  Wt 253 lb 12.8 oz (115.123 kg)  BMI 38.60 kg/m2 Patient is very pleasant and in no acute distress. Skin is warm and dry. Color is normal.  HEENT is unremarkable. Normocephalic/atraumatic. PERRL. Sclera are nonicteric. Neck is supple. No masses. No JVD. Lungs are clear. Cardiac exam shows a regular rate and rhythm. Abdomen is soft. Extremities are without edema. Gait and ROM are intact. No gross neurologic deficits noted.  Wt Readings from Last 3 Encounters:  05/12/14 253 lb 12.8 oz (115.123 kg)  05/09/14 246 lb (111.585 kg)  05/03/14 250 lb 11.2 oz (113.717 kg)    LABORATORY DATA/PROCEDURES:  Lab Results  Component Value Date   WBC 11.1* 02/10/2014   HGB 17.5* 02/10/2014   HCT 53.2* 02/10/2014   PLT 275 02/10/2014   GLUCOSE 251* 04/18/2014   CHOL 163 11/02/2013   TRIG 154.0* 11/02/2013   HDL 33.60* 11/02/2013   LDLDIRECT 75.0 03/27/2011   LDLCALC 99 11/02/2013   ALT 17 11/02/2013   AST 14 11/02/2013   NA 136 04/18/2014   K 3.8 04/18/2014   CL 95* 04/18/2014   CREATININE 0.9 04/18/2014   BUN 16 04/18/2014   CO2 29 04/18/2014   TSH 0.45 04/18/2014   PSA 1.85 11/02/2013   INR 1.0 01/31/2011   HGBA1C 9.3* 11/02/2013   MICROALBUR 0.3 11/29/2009    BNP (last 3 results) No results found for this basename: PROBNP,  in the last 8760 hours  Echo Study Conclusions from January 2015  - Left ventricle: The cavity size was normal. Wall thickness was normal. Systolic function was normal. The estimated ejection fraction was in the range of 55% to 60%. Wall motion was normal; there were no regional wall motion abnormalities. Doppler parameters are consistent with abnormal left ventricular relaxation (grade 1 diastolic dysfunction). Doppler parameters are consistent with  high ventricular filling pressure. - Atrial septum: No defect or patent foramen ovale was identified. - Impressions: Impaired relaxation with mildly elevated filling pressures, otherwise normal study. Impressions:  - Impaired relaxation with mildly elevated filling pressures, otherwise normal study.   Assessment / Plan: 1. Tachy palpitations - HR better. EKG looks to be sinus tach today. Will have him take an extra dose  of Toprol when he gets home. Explained how to use extra Toprol. He may need to consider ablation. He would like to discuss further with Dr. Stanford Breed - will keep that visit for next week. He will continue to monitor his BP - if gets low from his medicines may try to increase his salt.   2. CAD - multiple risk factors - update his Myoview  3. Tobacco abuse  4. Obesity  Patient is agreeable to this plan and will call if any problems develop in the interim.   Burtis Junes, RN, [place] [address] [place], [place]  [address] [phone]

## 2014-05-16 ENCOUNTER — Encounter: Payer: Self-pay | Admitting: Cardiology

## 2014-05-16 ENCOUNTER — Ambulatory Visit (INDEPENDENT_AMBULATORY_CARE_PROVIDER_SITE_OTHER): Payer: Medicare Other | Admitting: Cardiology

## 2014-05-16 VITALS — BP 122/70 | HR 96 | Ht 68.0 in | Wt 254.3 lb

## 2014-05-16 DIAGNOSIS — I2510 Atherosclerotic heart disease of native coronary artery without angina pectoris: Secondary | ICD-10-CM | POA: Diagnosis not present

## 2014-05-16 DIAGNOSIS — I10 Essential (primary) hypertension: Secondary | ICD-10-CM

## 2014-05-16 DIAGNOSIS — E785 Hyperlipidemia, unspecified: Secondary | ICD-10-CM | POA: Diagnosis not present

## 2014-05-16 DIAGNOSIS — I2584 Coronary atherosclerosis due to calcified coronary lesion: Secondary | ICD-10-CM

## 2014-05-16 DIAGNOSIS — I498 Other specified cardiac arrhythmias: Secondary | ICD-10-CM

## 2014-05-16 DIAGNOSIS — I259 Chronic ischemic heart disease, unspecified: Secondary | ICD-10-CM | POA: Diagnosis not present

## 2014-05-16 DIAGNOSIS — I471 Supraventricular tachycardia: Secondary | ICD-10-CM

## 2014-05-16 MED ORDER — METOPROLOL SUCCINATE ER 50 MG PO TB24: ORAL_TABLET | ORAL | Status: DC

## 2014-05-16 NOTE — Telephone Encounter (Signed)
Forwarded message to [employer]

## 2014-05-16 NOTE — Assessment & Plan Note (Signed)
Patient counseled on discontinuing. 

## 2014-05-16 NOTE — Assessment & Plan Note (Addendum)
Continue aspirin and statin. 

## 2014-05-16 NOTE — Patient Instructions (Addendum)
Your physician wants you to follow-up in: 3 [place] will receive a reminder letter in the mail two months in advance. If you don't receive a letter, please call our office to schedule the follow-up appointment.   INCREASE METOPROLOL TO 75 MG= ONE AND ONE HALF 50 MG TABLET TWICE DAILY

## 2014-05-16 NOTE — Assessment & Plan Note (Signed)
Patient continues to have palpitations and had an episode of SVT 4 days ago. Increase Toprol to 75 mg by mouth twice a day. If he continues to have a events despite medications he will need SVT ablation.

## 2014-05-16 NOTE — Progress Notes (Signed)
HPI: FU CAD and SVT. Patient states that he has had atrial fibrillation after surgeries in the past but no other cardiac history. Myoview in March 2012 showed extensive sub-diaphragmatic attenuation; EF 58%. Inferior ischemia cannot be excluded. TSH and BNP were normal. Cardiac cath in April of 2012 revealed normal LV function, normal pulmonary pressures and pulmonary capillary wedge pressure and nonobstructive coronary disease with the most significant lesion being a 50% LAD. Carotid Dopplers in May of 2013 were normal. Echocardiogram repeated in Jan of 2015. This revealed normal LV function, grade 1 diastolic dysfunction. Patient recently with palpitations and monitor showed SVT. Seen by Dr. Lovena Le and period of watchful waiting recommended. Since he was last seen, There is no dyspnea, chest pain or syncope. He did have a spell of SVT on July 10 3 hours. Heart rate was 160. There is some dizziness associated with this.    Current Outpatient Prescriptions  Medication Sig Dispense Refill  . aspirin 81 MG tablet Take 81 mg by mouth daily.        . CHROMIUM-CINNAMON PO Take 2,000 mg by mouth daily.      . furosemide (LASIX) 40 MG tablet Take 40 mg by mouth daily.       Marland Kitchen glimepiride (AMARYL) 1 MG tablet Take 1 mg by mouth daily with breakfast.      . INVOKANA 100 MG TABS Take 1 tablet by mouth Daily. per Dr Chalmers Cater (endocrinology)      . metFORMIN (GLUCOPHAGE) 500 MG tablet Take 2 tablets twice a day      . metoprolol succinate (TOPROL-XL) 50 MG 24 hr tablet Take 1 tablet (50 mg total) by mouth 2 (two) times daily.  180 tablet  3  . simvastatin (ZOCOR) 20 MG tablet Take 1 tablet (20 mg total) by mouth at bedtime.  90 tablet  3  . Turmeric 450 MG CAPS Take 450 mg by mouth daily.       No current facility-administered medications for this visit.     Past Medical History  Diagnosis Date  . DYSLIPIDEMIA 11/29/2009  . HYPERTENSION, ESSENTIAL 11/29/2009  . URTICARIA 02/06/2010  . BPH (benign  prostatic hypertrophy)   . Osteoarthritis   . DM (diabetes mellitus)   . OSA (obstructive sleep apnea) 03/26/2011    CPAP 9 cm H2O  . COPD (chronic obstructive pulmonary disease) 05/01/2011    PFT 05/01/11>>FEV1 2.07(72%), FEV1% 65, TLC 6.79(111%), DLCO 69%, no BD  . CAD (coronary artery disease)   . SVT (supraventricular tachycardia)     Past Surgical History  Procedure Laterality Date  . Appendectomy  1963  . Spine surgery      spinal fusion - L4, L5  . Total knee arthroplasty  2007  . Transurethral resection of prostate      bph  . Nasal sinus surgery  1989    History   Social History  . Marital Status: Married    Spouse Name: N/A    Number of Children: 3  . Years of Education: N/A   Occupational History  . Retired Other   Social History Main Topics  . Smoking status: Current Every Day Smoker -- 1.00 packs/day for 50 years    Types: Cigarettes  . Smokeless tobacco: Never Used     Comment: pt is again smoking < 1 PPD  . Alcohol Use: Yes     Comment: occasionallly  . Drug Use: No  . Sexual Activity: Not on file   Other  Topics Concern  . Not on file   Social History Narrative  . No narrative on file    ROS: no fevers or chills, productive cough, hemoptysis, dysphasia, odynophagia, melena, hematochezia, dysuria, hematuria, rash, seizure activity, orthopnea, PND, pedal edema, claudication. Remaining systems are negative.  Physical Exam: Well-developed well-nourished in no acute distress.  Skin is warm and dry.  HEENT is normal.  Neck is supple.  Chest is clear to auscultation with normal expansion.  Cardiovascular exam is regular rate and rhythm.  Abdominal exam nontender or distended. No masses palpated. Extremities show no edema. neuro grossly intact

## 2014-05-16 NOTE — Assessment & Plan Note (Signed)
Continue statin. 

## 2014-05-16 NOTE — Telephone Encounter (Signed)
New Prob    Pharmacist has some questions regarding Metoprolol. Please call.

## 2014-05-16 NOTE — Telephone Encounter (Signed)
This encounter was created in error - please disregard.

## 2014-05-16 NOTE — Assessment & Plan Note (Signed)
Blood pressure controlled. Continue present medications. 

## 2014-05-17 ENCOUNTER — Encounter: Payer: Self-pay | Admitting: Cardiology

## 2014-05-31 ENCOUNTER — Ambulatory Visit (HOSPITAL_COMMUNITY): Payer: Medicare Other | Attending: Cardiology | Admitting: Radiology

## 2014-05-31 ENCOUNTER — Encounter: Payer: Self-pay | Admitting: Cardiology

## 2014-05-31 VITALS — BP 98/58 | Ht 68.0 in | Wt 252.0 lb

## 2014-05-31 DIAGNOSIS — Z01818 Encounter for other preprocedural examination: Secondary | ICD-10-CM | POA: Diagnosis not present

## 2014-05-31 DIAGNOSIS — E119 Type 2 diabetes mellitus without complications: Secondary | ICD-10-CM | POA: Diagnosis not present

## 2014-05-31 DIAGNOSIS — F17200 Nicotine dependence, unspecified, uncomplicated: Secondary | ICD-10-CM | POA: Insufficient documentation

## 2014-05-31 DIAGNOSIS — J449 Chronic obstructive pulmonary disease, unspecified: Secondary | ICD-10-CM | POA: Insufficient documentation

## 2014-05-31 DIAGNOSIS — I471 Supraventricular tachycardia, unspecified: Secondary | ICD-10-CM | POA: Diagnosis not present

## 2014-05-31 DIAGNOSIS — I259 Chronic ischemic heart disease, unspecified: Secondary | ICD-10-CM

## 2014-05-31 DIAGNOSIS — I4891 Unspecified atrial fibrillation: Secondary | ICD-10-CM | POA: Insufficient documentation

## 2014-05-31 DIAGNOSIS — I10 Essential (primary) hypertension: Secondary | ICD-10-CM | POA: Insufficient documentation

## 2014-05-31 DIAGNOSIS — J4489 Other specified chronic obstructive pulmonary disease: Secondary | ICD-10-CM | POA: Insufficient documentation

## 2014-05-31 DIAGNOSIS — I2510 Atherosclerotic heart disease of native coronary artery without angina pectoris: Secondary | ICD-10-CM | POA: Insufficient documentation

## 2014-05-31 DIAGNOSIS — I4949 Other premature depolarization: Secondary | ICD-10-CM

## 2014-05-31 DIAGNOSIS — R002 Palpitations: Secondary | ICD-10-CM | POA: Insufficient documentation

## 2014-05-31 DIAGNOSIS — E785 Hyperlipidemia, unspecified: Secondary | ICD-10-CM | POA: Diagnosis not present

## 2014-05-31 DIAGNOSIS — R42 Dizziness and giddiness: Secondary | ICD-10-CM | POA: Insufficient documentation

## 2014-05-31 MED ORDER — REGADENOSON 0.4 MG/5ML IV SOLN
0.4000 mg | Freq: Once | INTRAVENOUS | Status: AC
  Administered 2014-05-31: 0.4 mg via INTRAVENOUS

## 2014-05-31 MED ORDER — TECHNETIUM TC 99M SESTAMIBI GENERIC - CARDIOLITE
10.0000 | Freq: Once | INTRAVENOUS | Status: AC | PRN
  Administered 2014-05-31: 10 via INTRAVENOUS

## 2014-05-31 MED ORDER — TECHNETIUM TC 99M SESTAMIBI GENERIC - CARDIOLITE
30.0000 | Freq: Once | INTRAVENOUS | Status: AC | PRN
  Administered 2014-05-31: 30 via INTRAVENOUS

## 2014-05-31 NOTE — Progress Notes (Signed)
[facility] [address] [place], [place] [address] [phone]    Cardiology Nuclear Med Study  Steve Charles is a 68 y.o. male     MRN : [id number]     DOB: 1946-09-08  Procedure Date: 05/31/2014  Nuclear Med Background Indication for Stress Test:  Evaluation for Ischemia, and Surgical Clearance for  (L) Carpel Tunnel on 06-16-2014 by Jovita Gamma, MD History:  COPD and 2012 N/O DZ CAD AFIB after surgeries PSVT, MPI: EF: 58% 2015 ECHO: EF: 55-60% Cardiac Risk Factors: Hypertension, Lipids, NIDDM and Smoker  Symptoms:  Dizziness and Palpitations   Nuclear Pre-Procedure Caffeine/Decaff Intake:  None> 12 hrs NPO After: 5:00am Orange Juice  Lungs:  clear O2 Sat: 98% on room air. IV 0.9% NS with Angio Cath:  22g  IV Site: L Hand x 1, tolerated well IV Started by:  Irven Baltimore, RN  Chest Size (in):  52 Cup Size: n/a  Height: 5\' 8"  (1.727 m)  Weight:  252 lb (114.306 kg)  BMI:  Body mass index is 38.33 kg/(m^2). Tech Comments:  Patient took Toprol this  am. No Diabetic po medications today. Fasting CBG was 124 at 0500 today.Irven Baltimore, RN.    Nuclear Med Study 1 or 2 day study: 1 day  Stress Test Type:  Treadmill/Lexiscan  Reading MD: N/A  Order Authorizing Provider:  Kirk Ruths, MD, and Truitt Merle, NP  Resting Radionuclide: Technetium 27m Sestamibi  Resting Radionuclide Dose: 11.0 mCi   Stress Radionuclide:  Technetium 20m Sestamibi  Stress Radionuclide Dose: 33.0 mCi           Stress Protocol Rest HR: 76 Stress HR: 94  Rest BP: 98/58 Stress BP: 126/68  Exercise Time (min): n/a METS: n/a   Predicted Max HR: 151 bpm % Max HR: 62.25 bpm Rate Pressure Product: 11844   Dose of Adenosine (mg):  n/a Dose of Lexiscan: 0.4 mg  Dose of Atropine (mg): n/a Dose of Dobutamine: n/a mcg/kg/min (at max HR)  Stress Test Technologist: Perrin Maltese, EMT-P  Nuclear Technologist:  Vedia Pereyra, CNMT     Rest Procedure:  Myocardial  perfusion imaging was performed at rest 45 minutes following the intravenous administration of Technetium 66m Sestamibi. Rest ECG: NSR with non-specific ST-T wave changes  Stress Procedure:  The patient received IV Lexiscan 0.4 mg over 15-seconds with concurrent low level exercise and then Technetium 72m Sestamibi was injected at 30-seconds while the patient continued walking one more minute. This patient had sob, dizziness, felt weird, and had pressure in his head with the Lexiscan injection. Quantitative spect images were obtained after a 45-minute delay. Stress ECG: No significant change from baseline ECG  QPS Raw Data Images:  Normal; no motion artifact; normal heart/lung ratio. Stress Images:  Normal homogeneous uptake in all areas of the myocardium. Rest Images:  Normal homogeneous uptake in all areas of the myocardium. Subtraction (SDS):  No evidence of ischemia. Transient Ischemic Dilatation (Normal <1.22):  1.20 Lung/Heart Ratio (Normal <0.45):  0.38  Quantitative Gated Spect Images QGS EDV:  89 ml QGS ESV:  32 ml  Impression Exercise Capacity:  Lexiscan with low level exercise. BP Response:  Normal blood pressure response. Clinical Symptoms:  No chest pain. ECG Impression:  No significant ST segment change suggestive of ischemia. Comparison with Prior Nuclear Study: No significant change from previous study. The previous apical thinning is not present on today's exam.  Overall Impression:  Normal stress nuclear study.  LV  Ejection Fraction: 64%.  LV Wall Motion:  NL LV Function; NL Wall Motion  Darlin Coco MD

## 2014-06-06 ENCOUNTER — Telehealth: Payer: Self-pay | Admitting: Cardiology

## 2014-06-07 ENCOUNTER — Encounter: Payer: Self-pay | Admitting: Cardiology

## 2014-06-07 ENCOUNTER — Telehealth: Payer: Self-pay | Admitting: Family Medicine

## 2014-06-07 ENCOUNTER — Ambulatory Visit (INDEPENDENT_AMBULATORY_CARE_PROVIDER_SITE_OTHER): Payer: Medicare Other | Admitting: Family

## 2014-06-07 ENCOUNTER — Encounter: Payer: Self-pay | Admitting: Family

## 2014-06-07 VITALS — BP 122/74 | HR 101 | Temp 98.5°F | Ht 68.0 in | Wt 254.0 lb

## 2014-06-07 DIAGNOSIS — I10 Essential (primary) hypertension: Secondary | ICD-10-CM

## 2014-06-07 DIAGNOSIS — I259 Chronic ischemic heart disease, unspecified: Secondary | ICD-10-CM

## 2014-06-07 DIAGNOSIS — R195 Other fecal abnormalities: Secondary | ICD-10-CM | POA: Diagnosis not present

## 2014-06-07 DIAGNOSIS — E119 Type 2 diabetes mellitus without complications: Secondary | ICD-10-CM

## 2014-06-07 LAB — BASIC METABOLIC PANEL
BUN: 17 mg/dL (ref 6–23)
CO2: 27 meq/L (ref 19–32)
Calcium: 9.4 mg/dL (ref 8.4–10.5)
Chloride: 98 mEq/L (ref 96–112)
Creatinine, Ser: 0.7 mg/dL (ref 0.4–1.5)
GFR: 111.35 mL/min (ref >60.00)
Glucose, Bld: 195 mg/dL — ABNORMAL HIGH (ref 70–99)
Potassium: 3.6 mEq/L (ref 3.5–5.1)
SODIUM: 137 meq/L (ref 135–145)

## 2014-06-07 LAB — CBC WITH DIFFERENTIAL/PLATELET
Basophils Absolute: 0 10*3/uL (ref 0.0–0.1)
Basophils Relative: 0.4 % (ref 0.0–3.0)
EOS ABS: 0.3 10*3/uL (ref 0.0–0.7)
Eosinophils Relative: 2.5 % (ref 0.0–5.0)
HCT: 53.2 % — ABNORMAL HIGH (ref 39.0–52.0)
Hemoglobin: 17.8 g/dL — ABNORMAL HIGH (ref 13.0–17.0)
LYMPHS ABS: 2.4 10*3/uL (ref 0.7–4.0)
Lymphocytes Relative: 24.2 % (ref 12.0–46.0)
MCHC: 33.4 g/dL (ref 30.0–36.0)
MCV: 89.1 fl (ref 78.0–100.0)
MONOS PCT: 9 % (ref 3.0–12.0)
Monocytes Absolute: 0.9 10*3/uL (ref 0.1–1.0)
Neutro Abs: 6.4 10*3/uL (ref 1.4–7.7)
Neutrophils Relative %: 63.9 % (ref 43.0–77.0)
PLATELETS: 243 10*3/uL (ref 150.0–400.0)
RBC: 5.97 Mil/uL — ABNORMAL HIGH (ref 4.22–5.81)
RDW: 15.2 % (ref 11.5–15.5)
WBC: 10.1 10*3/uL (ref 4.0–10.5)

## 2014-06-07 NOTE — Progress Notes (Signed)
Pre visit review using our clinic review tool, if applicable. No additional management support is needed unless otherwise documented below in the visit note. 

## 2014-06-07 NOTE — Progress Notes (Signed)
Subjective:    Patient ID: Steve Charles, male    DOB: 1946/07/14, 68 y.o.   MRN: [id number]  HPI 68 year old white male, nonsmokers in today with concerns of loose bowels x1 week. Reports having an increase her metoprolol to 75 mg twice a day by cardiology and believe that this has caused his bowels become loose. Reports approximately 3-5 stools per day. Not watery. Denies any abdominal pain, fever, fatigue, blood in the stools or dark black stools. Last colonoscopy was 5 years ago.   Review of Systems  Constitutional: Negative.   HENT: Negative.   Respiratory: Negative.   Cardiovascular: Negative.   Gastrointestinal: Negative for abdominal pain.       Loose stools  Endocrine: Negative.   Genitourinary: Negative.   Musculoskeletal: Negative.   Skin: Negative.   Allergic/Immunologic: Negative.   Neurological: Negative.   Psychiatric/Behavioral: Negative.    Past Medical History  Diagnosis Date  . DYSLIPIDEMIA 11/29/2009  . HYPERTENSION, ESSENTIAL 11/29/2009  . URTICARIA 02/06/2010  . BPH (benign prostatic hypertrophy)   . Osteoarthritis   . DM (diabetes mellitus)   . OSA (obstructive sleep apnea) 03/26/2011    CPAP 9 cm H2O  . COPD (chronic obstructive pulmonary disease) 05/01/2011    PFT 05/01/11>>FEV1 2.07(72%), FEV1% 65, TLC 6.79(111%), DLCO 69%, no BD  . CAD (coronary artery disease)   . SVT (supraventricular tachycardia)     History   Social History  . Marital Status: Married    Spouse Name: N/A    Number of Children: 3  . Years of Education: N/A   Occupational History  . Retired Other   Social History Main Topics  . Smoking status: Current Every Day Smoker -- 1.00 packs/day for 50 years    Types: Cigarettes  . Smokeless tobacco: Never Used     Comment: pt is again smoking < 1 PPD  . Alcohol Use: Yes     Comment: occasionallly  . Drug Use: No  . Sexual Activity: Not on file   Other Topics Concern  . Not on file   Social History Narrative  . No  narrative on file    Past Surgical History  Procedure Laterality Date  . Appendectomy  1963  . Spine surgery      spinal fusion - L4, L5  . Total knee arthroplasty  2007  . Transurethral resection of prostate      bph  . Nasal sinus surgery  1989    Family History  Problem Relation Age of Onset  . Arthritis Other   . Prostate cancer Brother   . Diabetes Other   . Cancer Father     Allergies  Allergen Reactions  . Codeine Sulfate     REACTION: hives  . Morphine And Related     Oral morphine  . Propoxyphene N-Acetaminophen     REACTION: hives  . Ramipril     Current Outpatient Prescriptions on File Prior to Visit  Medication Sig Dispense Refill  . aspirin 81 MG tablet Take 81 mg by mouth daily.        . CHROMIUM-CINNAMON PO Take 2,000 mg by mouth daily.      . furosemide (LASIX) 40 MG tablet Take 40 mg by mouth daily.       Marland Kitchen glimepiride (AMARYL) 1 MG tablet Take 1 mg by mouth daily with breakfast.      . INVOKANA 100 MG TABS Take 1 tablet by mouth Daily. per Dr Chalmers Cater (endocrinology)      .  metFORMIN (GLUCOPHAGE) 500 MG tablet Take 2 tablets twice a day      . Turmeric 450 MG CAPS Take 450 mg by mouth daily.      . simvastatin (ZOCOR) 20 MG tablet Take 1 tablet (20 mg total) by mouth at bedtime.  90 tablet  3   No current facility-administered medications on file prior to visit.    BP 122/74  Pulse 101  Temp(Src) 98.5 F (36.9 C) (Oral)  Ht 5\' 8"  (1.727 m)  Wt 254 lb (115.214 kg)  BMI 38.63 kg/m2chart   Objective:   Physical Exam  Constitutional: He is oriented to person, place, and time. He appears well-developed and well-nourished.  HENT:  Right Ear: External ear normal.  Left Ear: External ear normal.  Nose: Nose normal.  Mouth/Throat: Oropharynx is clear and moist.  Neck: Normal range of motion. Neck supple.  Cardiovascular: Normal rate, regular rhythm and normal heart sounds.   Pulmonary/Chest: Effort normal and breath sounds normal.  Abdominal:  Soft. Bowel sounds are normal. There is no tenderness. There is no rebound.  Neurological: He is alert and oriented to person, place, and time.  Skin: Skin is warm and dry.  Psychiatric: He has a normal mood and affect.          Assessment & Plan:  Generoso was seen today for no specified reason.  Diagnoses and associated orders for this visit:  Loose stools - CBC with Differential - Basic Metabolic Panel  Unspecified essential hypertension  Type II or unspecified type diabetes mellitus without mention of complication, not stated as uncontrolled    Encourage patient to call cardiology to discuss his concerns. I have sent off a CBC and CMP in the patient had results. He doesn't appear to have any symptoms of gastroenteritis at this point. Follow up as discussed.

## 2014-06-07 NOTE — Patient Instructions (Signed)
Dehydration, Adult Dehydration is when you lose more fluids from the body than you take in. Vital organs like the kidneys, brain, and heart cannot function without a proper amount of fluids and salt. Any loss of fluids from the body can cause dehydration.  CAUSES   Vomiting.  Diarrhea.  Excessive sweating.  Excessive urine output.  Fever. SYMPTOMS  Mild dehydration  Thirst.  Dry lips.  Slightly dry mouth. Moderate dehydration  Very dry mouth.  Sunken eyes.  Skin does not bounce back quickly when lightly pinched and released.  Dark urine and decreased urine production.  Decreased tear production.  Headache. Severe dehydration  Very dry mouth.  Extreme thirst.  Rapid, weak pulse (more than 100 beats per minute at rest).  Cold hands and feet.  Not able to sweat in spite of heat and temperature.  Rapid breathing.  Blue lips.  Confusion and lethargy.  Difficulty being awakened.  Minimal urine production.  No tears. DIAGNOSIS  Your caregiver will diagnose dehydration based on your symptoms and your exam. Blood and urine tests will help confirm the diagnosis. The diagnostic evaluation should also identify the cause of dehydration. TREATMENT  Treatment of mild or moderate dehydration can often be done at home by increasing the amount of fluids that you drink. It is best to drink small amounts of fluid more often. Drinking too much at one time can make vomiting worse. Refer to the home care instructions below. Severe dehydration needs to be treated at the hospital where you will probably be given intravenous (IV) fluids that contain water and electrolytes. HOME CARE INSTRUCTIONS   Ask your caregiver about specific rehydration instructions.  Drink enough fluids to keep your urine clear or pale yellow.  Drink small amounts frequently if you have nausea and vomiting.  Eat as you normally do.  Avoid:  Foods or drinks high in sugar.  Carbonated  drinks.  Juice.  Extremely hot or cold fluids.  Drinks with caffeine.  Fatty, greasy foods.  Alcohol.  Tobacco.  Overeating.  Gelatin desserts.  Wash your hands well to avoid spreading bacteria and viruses.  Only take over-the-counter or prescription medicines for pain, discomfort, or fever as directed by your caregiver.  Ask your caregiver if you should continue all prescribed and over-the-counter medicines.  Keep all follow-up appointments with your caregiver. SEEK MEDICAL CARE IF:  You have abdominal pain and it increases or stays in one area (localizes).  You have a rash, stiff neck, or severe headache.  You are irritable, sleepy, or difficult to awaken.  You are weak, dizzy, or extremely thirsty. SEEK IMMEDIATE MEDICAL CARE IF:   You are unable to keep fluids down or you get worse despite treatment.  You have frequent episodes of vomiting or diarrhea.  You have blood or green matter (bile) in your vomit.  You have blood in your stool or your stool looks black and tarry.  You have not urinated in 6 to 8 hours, or you have only urinated a small amount of very dark urine.  You have a fever.  You faint. MAKE SURE YOU:   Understand these instructions.  Will watch your condition.  Will get help right away if you are not doing well or get worse. Document Released: 10/20/2005 Document Revised: 01/12/2012 Document Reviewed: 06/09/2011 Exit[employer] Patient Information 2015 [employer]. This information is not intended to replace advice given to you by your health care provider. Make sure you discuss any questions you have with your health care   provider.  

## 2014-06-07 NOTE — Telephone Encounter (Signed)
Relevant patient education assigned to patient using Emmi. ° °

## 2014-06-08 ENCOUNTER — Telehealth: Payer: Self-pay | Admitting: Family Medicine

## 2014-06-08 NOTE — Telephone Encounter (Signed)
Closed encounter °

## 2014-06-08 NOTE — Telephone Encounter (Signed)
Relevant patient education assigned to patient using Emmi. ° °

## 2014-06-13 ENCOUNTER — Encounter: Payer: Self-pay | Admitting: Family Medicine

## 2014-06-14 ENCOUNTER — Telehealth: Payer: Self-pay | Admitting: Family Medicine

## 2014-06-14 NOTE — Telephone Encounter (Signed)
Pt has a "sore:" or something yucky on middle toe,. Pt is a diabetic.  Only SD appt on Friday. Is it ok to schedule?

## 2014-06-14 NOTE — Telephone Encounter (Signed)
appt sch for Friday!

## 2014-06-14 NOTE — Telephone Encounter (Signed)
Yes

## 2014-06-16 ENCOUNTER — Encounter: Payer: Self-pay | Admitting: Family Medicine

## 2014-06-16 ENCOUNTER — Ambulatory Visit (INDEPENDENT_AMBULATORY_CARE_PROVIDER_SITE_OTHER): Payer: Medicare Other | Admitting: Family Medicine

## 2014-06-16 VITALS — BP 122/70 | HR 90 | Temp 97.8°F | Wt 256.0 lb

## 2014-06-16 DIAGNOSIS — L97509 Non-pressure chronic ulcer of other part of unspecified foot with unspecified severity: Secondary | ICD-10-CM | POA: Diagnosis not present

## 2014-06-16 DIAGNOSIS — L298 Other pruritus: Secondary | ICD-10-CM

## 2014-06-16 DIAGNOSIS — L97511 Non-pressure chronic ulcer of other part of right foot limited to breakdown of skin: Secondary | ICD-10-CM

## 2014-06-16 DIAGNOSIS — F458 Other somatoform disorders: Secondary | ICD-10-CM

## 2014-06-16 DIAGNOSIS — I259 Chronic ischemic heart disease, unspecified: Secondary | ICD-10-CM | POA: Diagnosis not present

## 2014-06-16 MED ORDER — GABAPENTIN 100 MG PO CAPS: 100.0000 mg | ORAL_CAPSULE | Freq: Two times a day (BID) | ORAL | Status: DC

## 2014-06-16 NOTE — Patient Instructions (Signed)
Follow up promptly for any signs of infection such as increased redness, warmth, or pus-like drainage.

## 2014-06-16 NOTE — Progress Notes (Signed)
   Subjective:    Patient ID: Steve Charles, male    DOB: 03-Mar-1946, 68 y.o.   MRN: [id number]  HPI  Right third toe ulcer. He states for several weeks he's had severe itching involving mostly the third and fourth toes of the right foot. Saw dermatologist and they treated him with several antifungal medications without improvement. He has not seen any scaly rashes. He's been scratching this and 2 days ago noticed some skin had been removed off the surface. No signs of secondary infection. Using topical antibiotic.  Has type 2 diabetes. He has some mild neuropathy. No fevers or chills    He has severe itching involving the toes and questions whether this could be neuropathic.  Past Medical History  Diagnosis Date  . DYSLIPIDEMIA 11/29/2009  . HYPERTENSION, ESSENTIAL 11/29/2009  . URTICARIA 02/06/2010  . BPH (benign prostatic hypertrophy)   . Osteoarthritis   . DM (diabetes mellitus)   . OSA (obstructive sleep apnea) 03/26/2011    CPAP 9 cm H2O  . COPD (chronic obstructive pulmonary disease) 05/01/2011    PFT 05/01/11>>FEV1 2.07(72%), FEV1% 65, TLC 6.79(111%), DLCO 69%, no BD  . CAD (coronary artery disease)   . SVT (supraventricular tachycardia)    Past Surgical History  Procedure Laterality Date  . Appendectomy  1963  . Spine surgery      spinal fusion - L4, L5  . Total knee arthroplasty  2007  . Transurethral resection of prostate      bph  . Nasal sinus surgery  1989    reports that he has been smoking Cigarettes.  He has a 50 pack-year smoking history. He has never used smokeless tobacco. He reports that he drinks alcohol. He reports that he does not use illicit drugs. family history includes Arthritis in his other; Cancer in his father; Diabetes in his other; Prostate cancer in his brother. Allergies  Allergen Reactions  . Codeine Sulfate     REACTION: hives  . Morphine And Related     Oral morphine  . Propoxyphene N-Acetaminophen     REACTION: hives  . Ramipril       Review of Systems  Constitutional: Negative for fever and chills.  Respiratory: Negative for shortness of breath.   Cardiovascular: Negative for chest pain and leg swelling.  Neurological: Negative for weakness and numbness.  Hematological: Negative for adenopathy.       Objective:   Physical Exam  Constitutional: He is oriented to person, place, and time.  Cardiovascular: Normal rate and regular rhythm.  Exam reveals no gallop.   Pulmonary/Chest: Effort normal and breath sounds normal. No respiratory distress. He has no wheezes. He has no rales.  Musculoskeletal: He exhibits no edema.  Normal DP pulses.  Neurological: He is alert and oriented to person, place, and time.  Skin: No rash noted.  Right third toe reveals 1 x 3 cm area of superficial ulceration where epithelium has been denuded. No erythema. No drainage. Feet are warm to touch with good distal pulses. No signs of secondary infection          Assessment & Plan:  #1 superficial ulcer right third toe dorsally from scratching. No signs of secondary infection. Wound care instruction given.  Keep clean with soap and water and continue with daily topical antibiotic. #2 probable neuropathic pruritus. Trial of gabapentin 100 mg each bedtime and titrate to one twice a day if necessary. Touch base for feedback in 2 weeks.

## 2014-06-16 NOTE — Progress Notes (Signed)
Pre visit review using our clinic review tool, if applicable. No additional management support is needed unless otherwise documented below in the visit note. 

## 2014-06-21 ENCOUNTER — Ambulatory Visit (INDEPENDENT_AMBULATORY_CARE_PROVIDER_SITE_OTHER): Payer: Medicare Other | Admitting: Internal Medicine

## 2014-06-21 ENCOUNTER — Encounter: Payer: Self-pay | Admitting: Internal Medicine

## 2014-06-21 VITALS — BP 102/70 | HR 96 | Ht 68.0 in | Wt 251.4 lb

## 2014-06-21 DIAGNOSIS — I498 Other specified cardiac arrhythmias: Secondary | ICD-10-CM | POA: Diagnosis not present

## 2014-06-21 DIAGNOSIS — I471 Supraventricular tachycardia: Secondary | ICD-10-CM

## 2014-06-21 DIAGNOSIS — I259 Chronic ischemic heart disease, unspecified: Secondary | ICD-10-CM

## 2014-06-21 MED ORDER — VERAPAMIL HCL ER 240 MG PO TBCR: 240.0000 mg | EXTENDED_RELEASE_TABLET | Freq: Every day | ORAL | Status: DC

## 2014-06-21 NOTE — Patient Instructions (Signed)
Your physician has recommended you make the following change in your medication:  1) STOP Metoprolol 2) START Verapamil 240 mg daily  Your physician recommends that you schedule a follow-up appointment in: 8 weeks with Dr. Lovena Le.

## 2014-06-21 NOTE — Progress Notes (Signed)
HPI [name] returns today for followup. He is a pleasant 68 yo man with palpitations, SVT, HTN, DM and obesity. He has been treated with escalating doses of metoprolol and has for the most part control of his arrhythmias. The patient has developed increasingly frequent stools though no frank diarrhea. His blood pressure has been controlled. He has had SVT for years. Previously his SVT could be controlled with vagal maneuvers. Allergies  Allergen Reactions  . Codeine Sulfate     REACTION: hives  . Morphine And Related     Oral morphine  . Propoxyphene N-Acetaminophen     REACTION: hives  . Ramipril      Current Outpatient Prescriptions  Medication Sig Dispense Refill  . aspirin 81 MG tablet Take 81 mg by mouth daily.        . CHROMIUM-CINNAMON PO Take 2,000 mg by mouth daily.      . furosemide (LASIX) 40 MG tablet Take 40 mg by mouth daily.       Marland Kitchen gabapentin (NEURONTIN) 100 MG capsule Take 1 capsule (100 mg total) by mouth 2 (two) times daily.  60 capsule  3  . glimepiride (AMARYL) 1 MG tablet Take 1 mg by mouth daily with breakfast.      . INVOKANA 100 MG TABS Take 1 tablet by mouth Daily. per Dr Chalmers Cater (endocrinology)      . metFORMIN (GLUCOPHAGE) 500 MG tablet Take 2 tablets twice a day      . simvastatin (ZOCOR) 20 MG tablet Take 1 tablet (20 mg total) by mouth at bedtime.  90 tablet  3  . Turmeric 450 MG CAPS Take 450 mg by mouth daily.      . verapamil (CALAN-SR) 240 MG CR tablet Take 1 tablet (240 mg total) by mouth daily.  90 tablet  0   No current facility-administered medications for this visit.     Past Medical History  Diagnosis Date  . DYSLIPIDEMIA 11/29/2009  . HYPERTENSION, ESSENTIAL 11/29/2009  . URTICARIA 02/06/2010  . BPH (benign prostatic hypertrophy)   . Osteoarthritis   . DM (diabetes mellitus)   . OSA (obstructive sleep apnea) 03/26/2011    CPAP 9 cm H2O  . COPD (chronic obstructive pulmonary disease) 05/01/2011    PFT 05/01/11>>FEV1  2.07(72%), FEV1% 65, TLC 6.79(111%), DLCO 69%, no BD  . CAD (coronary artery disease)   . SVT (supraventricular tachycardia)     ROS:   All systems reviewed and negative except as noted in the HPI.   Past Surgical History  Procedure Laterality Date  . Appendectomy  1963  . Spine surgery      spinal fusion - L4, L5  . Total knee arthroplasty  2007  . Transurethral resection of prostate      bph  . Nasal sinus surgery  1989     Family History  Problem Relation Age of Onset  . Arthritis Other   . Prostate cancer Brother   . Diabetes Other   . Cancer Father      History   Social History  . Marital Status: Married    Spouse Name: N/A    Number of Children: 3  . Years of Education: N/A   Occupational History  . Retired Other   Social History Main Topics  . Smoking status: Current Every Day Smoker -- 1.00 packs/day for 50 years    Types: Cigarettes  . Smokeless tobacco: Never Used     Comment: pt is again  smoking < 1 PPD  . Alcohol Use: Yes     Comment: occasionallly  . Drug Use: No  . Sexual Activity: Not on file   Other Topics Concern  . Not on file   Social History Narrative  . No narrative on file     BP 102/70  Pulse 96  Ht 5\' 8"  (1.727 m)  Wt 251 lb 6.4 oz (114.034 kg)  BMI 38.23 kg/m2  Physical Exam:  Well appearing 68 yo man, obese but in NAD HEENT: Unremarkable Neck:  No JVD, no thyromegally Back:  No CVA tenderness Lungs:  Clear with no wheezes HEART:  Regular rate rhythm, no murmurs, no rubs, no clicks Abd:  soft, obese, positive bowel sounds, no organomegally, no rebound, no guarding Ext:  2 plus pulses, no edema, no cyanosis, no clubbing Skin:  No rashes no nodules Neuro:  CN II through XII intact, motor grossly intact  EKG - nsr   Assess/Plan:

## 2014-06-21 NOTE — Assessment & Plan Note (Signed)
We discussed the treatment options. The patient is not ready to consider catheter ablation. As he has had increasingly frequent stools, we will ask him to try verapamil to control both his SVT and his frequent BM's. I will see him back in about 2 months.

## 2014-06-25 ENCOUNTER — Encounter: Payer: Self-pay | Admitting: *Deleted

## 2014-06-26 DIAGNOSIS — G5600 Carpal tunnel syndrome, unspecified upper limb: Secondary | ICD-10-CM | POA: Diagnosis not present

## 2014-07-03 ENCOUNTER — Encounter (HOSPITAL_BASED_OUTPATIENT_CLINIC_OR_DEPARTMENT_OTHER): Payer: Medicare Other | Attending: Plastic Surgery

## 2014-07-13 DIAGNOSIS — M25539 Pain in unspecified wrist: Secondary | ICD-10-CM | POA: Diagnosis not present

## 2014-07-13 DIAGNOSIS — M25449 Effusion, unspecified hand: Secondary | ICD-10-CM | POA: Diagnosis not present

## 2014-07-13 DIAGNOSIS — G5600 Carpal tunnel syndrome, unspecified upper limb: Secondary | ICD-10-CM | POA: Diagnosis not present

## 2014-07-20 DIAGNOSIS — M25539 Pain in unspecified wrist: Secondary | ICD-10-CM | POA: Diagnosis not present

## 2014-07-20 DIAGNOSIS — M25449 Effusion, unspecified hand: Secondary | ICD-10-CM | POA: Diagnosis not present

## 2014-07-20 DIAGNOSIS — G5600 Carpal tunnel syndrome, unspecified upper limb: Secondary | ICD-10-CM | POA: Diagnosis not present

## 2014-07-27 ENCOUNTER — Encounter: Payer: Medicare Other | Attending: Endocrinology | Admitting: *Deleted

## 2014-07-27 VITALS — Ht 68.0 in | Wt 250.6 lb

## 2014-07-27 DIAGNOSIS — E119 Type 2 diabetes mellitus without complications: Secondary | ICD-10-CM

## 2014-07-27 DIAGNOSIS — G5600 Carpal tunnel syndrome, unspecified upper limb: Secondary | ICD-10-CM | POA: Diagnosis not present

## 2014-07-27 DIAGNOSIS — Z713 Dietary counseling and surveillance: Secondary | ICD-10-CM | POA: Insufficient documentation

## 2014-07-27 DIAGNOSIS — M25539 Pain in unspecified wrist: Secondary | ICD-10-CM | POA: Diagnosis not present

## 2014-07-27 DIAGNOSIS — IMO0001 Reserved for inherently not codable concepts without codable children: Secondary | ICD-10-CM | POA: Diagnosis not present

## 2014-07-27 DIAGNOSIS — M25449 Effusion, unspecified hand: Secondary | ICD-10-CM | POA: Diagnosis not present

## 2014-07-27 NOTE — Progress Notes (Signed)
  Medical Nutrition Therapy: 07/27/2014  Appt start time:  1000    end time:  1030. Patient states his last A1c was 7.1 %   Assessment:  Primary concerns today: patient here for follow up diabetes education follow up visit. His wieght is stable at 250 pounds, he is happy with his control of his Diabetes overall. He has questions about possible fiber supplements and what their potential benefit could be. He continues to walk with his job but is not exercising as much as he used to. He continues to appreciate the accountability by coming every 3 months which his insurance will cover @ 3 hours education per year.    TANITA  BODY COMP RESULTS on 07/21/13  Weight 255.5 lb   BMI (kg/m^2) 38.8   Fat Mass (lbs) 97.5 lb   Fat Free Mass (lbs) 158.0 lb   Total Body Water (lbs) 115.5 lb   TANITA  BODY COMP RESULTS on 10/21/13  Weight 257.5 lb   BMI (kg/m^2) 39.2 lb   Fat Mass (lbs) 97.5 lb   Fat Free Mass (lbs) 160.0 lb   Total Body Water (lbs) 117.0 lb   TANITA  BODY COMP RESULTS on 01/20/14  Weight 248.5 lb   BMI (kg/m^2) 37.8   Fat Mass (lbs) 96.0 lb   Fat Free Mass (lbs) 152.5 lb   Total Body Water (lbs) 111.5 lb   TANITA  BODY COMP RESULTS  Weight 250.0 lb   BMI (kg/m^2) 38.0   Fat Mass (lbs) 100.0 lb   Fat Free Mass (lbs) 150.0 lb   Total Body Water (lbs) 110.0 lb   TANITA  BODY COMP RESULTS  07/27/2014 Weight 249.0 lb   BMI (kg/m^2) 37.9   Fat Mass (lbs) 103.5   Fat Free Mass (lbs) 145.5   Total Body Water (lbs) 106.5 lb   Increase in Fat Mass probably due to decrease in exercise program. Also note less Total Body Water which contributes to decrease in Fat Free Mass as well. Recommend increasing activity level as tolerated.  MEDICATIONS: see list,    Usual physical activity: walking as able    Intervention: Nutrition counseling and diabetes education continued. Encouraged him to continue with his current eating habits and increasing his activity level Plan:  Continue to aim  for 3 Carb Choices per meal (45 grams) +/- 1 either way  Continue to aim for 0-2 Carbs per snack if hungry  Continue reading food labels for Total Carbohydrate and Fat Grams of foods Continue with your activity level by walking after supper on less active days for 20 minutes daily as tolerated Continue checking BG at alternate times per day on occasion     Handouts given during visit include: [place]ita Scale results handout  Monitoring/Evaluation:  Dietary intake, exercise, reading food labels, and body weight in 3 months.

## 2014-07-31 NOTE — Patient Instructions (Signed)
Plan:  Continue to aim for 3 Carb Choices per meal (45 grams) +/- 1 either way  Continue to aim for 0-2 Carbs per snack if hungry  Continue reading food labels for Total Carbohydrate and Fat Grams of foods Continue with your activity level by walking after supper on less active days for 20 minutes daily as tolerated Continue checking BG at alternate times per day on occasion

## 2014-08-03 DIAGNOSIS — M25442 Effusion, left hand: Secondary | ICD-10-CM | POA: Diagnosis not present

## 2014-08-03 DIAGNOSIS — G5602 Carpal tunnel syndrome, left upper limb: Secondary | ICD-10-CM | POA: Diagnosis not present

## 2014-08-03 DIAGNOSIS — M25531 Pain in right wrist: Secondary | ICD-10-CM | POA: Diagnosis not present

## 2014-08-08 ENCOUNTER — Telehealth: Payer: Self-pay | Admitting: Family Medicine

## 2014-08-08 NOTE — Telephone Encounter (Signed)
Pt would like a lab order for Urine Microalbumin lab only Ok to sch?

## 2014-08-09 ENCOUNTER — Other Ambulatory Visit: Payer: Self-pay | Admitting: Family Medicine

## 2014-08-09 DIAGNOSIS — E119 Type 2 diabetes mellitus without complications: Secondary | ICD-10-CM

## 2014-08-09 NOTE — Telephone Encounter (Signed)
Left message for patient to return call.

## 2014-08-09 NOTE — Telephone Encounter (Signed)
Pt aware the lab is ordered.

## 2014-08-10 ENCOUNTER — Other Ambulatory Visit: Payer: Medicare Other

## 2014-08-10 DIAGNOSIS — E119 Type 2 diabetes mellitus without complications: Secondary | ICD-10-CM | POA: Diagnosis not present

## 2014-08-10 LAB — MICROALBUMIN / CREATININE URINE RATIO
Creatinine,U: 45.6 mg/dL
MICROALB/CREAT RATIO: 2.2 mg/g (ref 0.0–30.0)
Microalb, Ur: 1 mg/dL (ref 0.0–1.9)

## 2014-08-23 ENCOUNTER — Ambulatory Visit (INDEPENDENT_AMBULATORY_CARE_PROVIDER_SITE_OTHER): Payer: Medicare Other | Admitting: Internal Medicine

## 2014-08-23 ENCOUNTER — Encounter: Payer: Self-pay | Admitting: Internal Medicine

## 2014-08-23 VITALS — BP 114/50 | HR 97 | Ht 68.0 in | Wt 254.2 lb

## 2014-08-23 DIAGNOSIS — R072 Precordial pain: Secondary | ICD-10-CM | POA: Diagnosis not present

## 2014-08-23 DIAGNOSIS — R002 Palpitations: Secondary | ICD-10-CM | POA: Diagnosis not present

## 2014-08-23 DIAGNOSIS — I471 Supraventricular tachycardia: Secondary | ICD-10-CM

## 2014-08-23 DIAGNOSIS — I259 Chronic ischemic heart disease, unspecified: Secondary | ICD-10-CM | POA: Diagnosis not present

## 2014-08-23 NOTE — Assessment & Plan Note (Signed)
His symptoms are well controlled. No change in meds.

## 2014-08-23 NOTE — Patient Instructions (Signed)
Your physician recommends that you continue on your current medications as directed. Please refer to the Current Medication list given to you today.   Your physician recommends that you schedule a follow-up appointment as needed  

## 2014-08-23 NOTE — Assessment & Plan Note (Signed)
His verapamil appears to be controlling his SVT very nicely and his frequent BM's have resolved. No change in meds today. He will followup on an as needed basis.

## 2014-08-23 NOTE — Progress Notes (Signed)
HPI [name] returns today for followup. He is a pleasant 68 yo man with palpitations, SVT, HTN, DM and obesity. He has been treated with escalating doses of metoprolol and has for the most part control of his arrhythmias. The patient has developed increasingly frequent stools though no frank diarrhea. His blood pressure has been controlled. He has had SVT for years. Previously his SVT could be controlled with vagal maneuvers. Because of frequent stools, we placed him on verapamil and his arrhythmias and his frequent stools have been well controlled. He has not had prolonged symptomatic SVT.  Allergies  Allergen Reactions  . Codeine Sulfate Hives    REACTION: hives  . Morphine And Related Hives    Oral morphine/ hives   . Propoxyphene N-Acetaminophen Hives    REACTION: hives  . Ramipril Hives    Hives      Current Outpatient Prescriptions  Medication Sig Dispense Refill  . aspirin 81 MG tablet Take 81 mg by mouth daily.        . Canagliflozin (INVOKANA) 300 MG TABS Take 1 tablet by mouth daily.      . CHROMIUM-CINNAMON PO Take 2,000 mg by mouth daily.      . furosemide (LASIX) 40 MG tablet Take 40 mg by mouth daily.       Marland Kitchen glimepiride (AMARYL) 1 MG tablet Take 1 mg by mouth 2 (two) times daily.       . metFORMIN (GLUCOPHAGE) 500 MG tablet Take 2 tablets twice a day      . Turmeric 450 MG CAPS Take 450 mg by mouth daily.      . verapamil (CALAN-SR) 240 MG CR tablet Take 1 tablet (240 mg total) by mouth daily.  90 tablet  0  . simvastatin (ZOCOR) 20 MG tablet Take 1 tablet (20 mg total) by mouth at bedtime.  90 tablet  3   No current facility-administered medications for this visit.     Past Medical History  Diagnosis Date  . DYSLIPIDEMIA 11/29/2009  . HYPERTENSION, ESSENTIAL 11/29/2009  . URTICARIA 02/06/2010  . BPH (benign prostatic hypertrophy)   . Osteoarthritis   . DM (diabetes mellitus)   . OSA (obstructive sleep apnea) 03/26/2011    CPAP 9 cm H2O  . COPD  (chronic obstructive pulmonary disease) 05/01/2011    PFT 05/01/11>>FEV1 2.07(72%), FEV1% 65, TLC 6.79(111%), DLCO 69%, no BD  . CAD (coronary artery disease)   . SVT (supraventricular tachycardia)     ROS:   All systems reviewed and negative except as noted in the HPI.   Past Surgical History  Procedure Laterality Date  . Appendectomy  1963  . Spine surgery      spinal fusion - L4, L5  . Total knee arthroplasty  2007  . Transurethral resection of prostate      bph  . Nasal sinus surgery  1989     Family History  Problem Relation Age of Onset  . Arthritis Other   . Diabetes Other   . Prostate cancer Brother   . Cancer Father      History   Social History  . Marital Status: Married    Spouse Name: N/A    Number of Children: 3  . Years of Education: N/A   Occupational History  . Retired Other   Social History Main Topics  . Smoking status: Current Every Day Smoker -- 1.00 packs/day for 50 years    Types: Cigarettes  . Smokeless tobacco:  Never Used     Comment: pt is again smoking < 1 PPD  . Alcohol Use: Yes     Comment: occasionallly  . Drug Use: No  . Sexual Activity: Not on file   Other Topics Concern  . Not on file   Social History Narrative  . No narrative on file     BP 114/50  Pulse 97  Ht 5\' 8"  (1.727 m)  Wt 254 lb 3.2 oz (115.304 kg)  BMI 38.66 kg/m2  Physical Exam:  Well appearing 68 yo man, obese but in NAD HEENT: Unremarkable Neck:  No JVD, no thyromegally Back:  No CVA tenderness Lungs:  Clear with no wheezes HEART:  Regular rate rhythm, no murmurs, no rubs, no clicks Abd:  soft, obese, positive bowel sounds, no organomegally, no rebound, no guarding Ext:  2 plus pulses, no edema, no cyanosis, no clubbing Skin:  No rashes no nodules Neuro:  CN II through XII intact, motor grossly intact  EKG - nsr with left anterior HB   Assess/Plan:

## 2014-08-23 NOTE — Assessment & Plan Note (Signed)
He is improved. I discussed the benign nature of his palpitations which are very brief. Will follow.

## 2014-08-24 ENCOUNTER — Ambulatory Visit (INDEPENDENT_AMBULATORY_CARE_PROVIDER_SITE_OTHER): Payer: Medicare Other | Admitting: Cardiology

## 2014-08-24 ENCOUNTER — Encounter: Payer: Self-pay | Admitting: Cardiology

## 2014-08-24 ENCOUNTER — Ambulatory Visit (INDEPENDENT_AMBULATORY_CARE_PROVIDER_SITE_OTHER): Payer: Medicare Other | Admitting: Family Medicine

## 2014-08-24 ENCOUNTER — Encounter: Payer: Self-pay | Admitting: Family Medicine

## 2014-08-24 VITALS — BP 118/68 | HR 90 | Temp 97.5°F | Wt 254.0 lb

## 2014-08-24 VITALS — BP 126/60 | HR 96 | Ht 68.0 in | Wt 254.4 lb

## 2014-08-24 DIAGNOSIS — N400 Enlarged prostate without lower urinary tract symptoms: Secondary | ICD-10-CM | POA: Diagnosis not present

## 2014-08-24 DIAGNOSIS — E785 Hyperlipidemia, unspecified: Secondary | ICD-10-CM | POA: Diagnosis not present

## 2014-08-24 DIAGNOSIS — L301 Dyshidrosis [pompholyx]: Secondary | ICD-10-CM

## 2014-08-24 DIAGNOSIS — I259 Chronic ischemic heart disease, unspecified: Secondary | ICD-10-CM | POA: Diagnosis not present

## 2014-08-24 DIAGNOSIS — Z23 Encounter for immunization: Secondary | ICD-10-CM | POA: Diagnosis not present

## 2014-08-24 LAB — PSA: PSA: 1.54 ng/mL (ref 0.10–4.00)

## 2014-08-24 MED ORDER — PRAVASTATIN SODIUM 40 MG PO TABS: 40.0000 mg | ORAL_TABLET | Freq: Every evening | ORAL | Status: DC

## 2014-08-24 MED ORDER — DESOXIMETASONE 0.25 % EX CREA
1.0000 | TOPICAL_CREAM | Freq: Two times a day (BID) | CUTANEOUS | Status: DC
Start: 2014-08-24 — End: 2015-03-29

## 2014-08-24 NOTE — Assessment & Plan Note (Signed)
Blood pressure controlled. Continue present medications. 

## 2014-08-24 NOTE — Assessment & Plan Note (Signed)
Continue aspirin and statin. 

## 2014-08-24 NOTE — Progress Notes (Signed)
   Subjective:    Patient ID: Steve Charles, male    DOB: 07/11/1946, 68 y.o.   MRN: [id number]  HPI 68 year old male presents with right foot itching.  The foot has been itching for 3-4 years.  He has tried gabapentin for the itching and had no relief.  Dermatology has ruled out any athletes foot.  They have also done KOH scrapes for fungal causes.  He has tried several different topical creams with no change.  Cardiology has done doppler studies for circulatory causes as well.  Itching is localized to the interdigital spaces on the right foot, sparing the space between the 1st and 2nd toe.  Denies  Breaks to the skin, drainage, numbness/ tingling or pain.      Review of Systems  Constitutional: Negative for fever and activity change.  Respiratory: Negative for shortness of breath.   Cardiovascular: Negative for chest pain.  Musculoskeletal: Negative for arthralgias, gait problem, joint swelling and myalgias.  Skin: Positive for color change and rash. Negative for wound.       Scaling rash to interdigital space  Neurological: Negative for weakness, numbness and headaches.  Hematological: Does not bruise/bleed easily.       Objective:   Physical Exam  Nursing note and vitals reviewed. Constitutional: He is oriented to person, place, and time. He appears well-developed and well-nourished.  Cardiovascular: Normal rate, regular rhythm and normal heart sounds.   No murmur heard. Pulmonary/Chest: Effort normal and breath sounds normal. No respiratory distress. He has no wheezes.  Musculoskeletal: Normal range of motion. He exhibits no tenderness.  Neurological: He is alert and oriented to person, place, and time.  Skin: Skin is warm and dry. Rash noted. No erythema.  Dry scaled rash between the toes on right foot with no breaks to the skin.  No drainage or erythematous.  No rash to left foot or hands.          Assessment & Plan:  Dyshidrosis (Dyshidrotic eczema)- started on  topicort .25% cream BID x 2 weeks.  Patient was educated to only use cream for a short time to avoid skin and pigmentation changes.  Will not repeat KOH test since cultures were already performed.  Less likely to be circulatory since he has strong pulses, denies pain, paresthesias, or changes in temperature of the foot.     Joseph Art PA-S  Agree with above.  He will notify us if no improvement in 2-3 weeks. Pt requesting repeat PSA.  History of BPH. Carolann Littler MD

## 2014-08-24 NOTE — Progress Notes (Signed)
HPI: FU CAD and SVT. Patient states that he has had atrial fibrillation after surgeries in the past. Cardiac cath in April of 2012 revealed normal LV function, normal pulmonary pressures and pulmonary capillary wedge pressure and nonobstructive coronary disease with the most significant lesion being a 50% LAD. Carotid Dopplers in May of 2013 were normal. Echocardiogram repeated in Jan of 2015. This revealed normal LV function, grade 1 diastolic dysfunction. Nuclear study 7/15 showed EF 64 and normal perfusion. Patient also with SVT. Seen by Dr. Lovena Le and metoprolol changed to verapamil. Since last seen, He has an occasional skip but no sustained palpitations.He denies dyspnea, chest pain or syncope.   Current Outpatient Prescriptions  Medication Sig Dispense Refill  . aspirin 81 MG tablet Take 81 mg by mouth daily.        . Canagliflozin (INVOKANA) 300 MG TABS Take 1 tablet by mouth daily.      . CHROMIUM-CINNAMON PO Take 2,000 mg by mouth daily.      Marland Kitchen desoximetasone (TOPICORT) 0.25 % cream Apply 1 application topically 2 (two) times daily.  30 g  1  . furosemide (LASIX) 40 MG tablet Take 40 mg by mouth daily.       Marland Kitchen glimepiride (AMARYL) 1 MG tablet Take 1 mg by mouth 2 (two) times daily.       . metFORMIN (GLUCOPHAGE) 500 MG tablet Take 2 tablets twice a day      . simvastatin (ZOCOR) 20 MG tablet Take 1 tablet (20 mg total) by mouth at bedtime.  90 tablet  3  . Turmeric 450 MG CAPS Take 450 mg by mouth daily.      . verapamil (CALAN-SR) 240 MG CR tablet Take 1 tablet (240 mg total) by mouth daily.  90 tablet  0   No current facility-administered medications for this visit.     Past Medical History  Diagnosis Date  . DYSLIPIDEMIA 11/29/2009  . HYPERTENSION, ESSENTIAL 11/29/2009  . URTICARIA 02/06/2010  . BPH (benign prostatic hypertrophy)   . Osteoarthritis   . DM (diabetes mellitus)   . OSA (obstructive sleep apnea) 03/26/2011    CPAP 9 cm H2O  . COPD (chronic obstructive  pulmonary disease) 05/01/2011    PFT 05/01/11>>FEV1 2.07(72%), FEV1% 65, TLC 6.79(111%), DLCO 69%, no BD  . CAD (coronary artery disease)   . SVT (supraventricular tachycardia)     Past Surgical History  Procedure Laterality Date  . Appendectomy  1963  . Spine surgery      spinal fusion - L4, L5  . Total knee arthroplasty  2007  . Transurethral resection of prostate      bph  . Nasal sinus surgery  1989    History   Social History  . Marital Status: Married    Spouse Name: N/A    Number of Children: 3  . Years of Education: N/A   Occupational History  . Retired Other   Social History Main Topics  . Smoking status: Current Every Day Smoker -- 1.00 packs/day for 50 years    Types: Cigarettes  . Smokeless tobacco: Never Used     Comment: pt is again smoking < 1 PPD  . Alcohol Use: Yes     Comment: occasionallly  . Drug Use: No  . Sexual Activity: Not on file   Other Topics Concern  . Not on file   Social History Narrative  . No narrative on file    ROS: no fevers or chills,  productive cough, hemoptysis, dysphasia, odynophagia, melena, hematochezia, dysuria, hematuria, rash, seizure activity, orthopnea, PND, pedal edema, claudication. Remaining systems are negative.  Physical Exam: Well-developed well-nourished in no acute distress.  Skin is warm and dry.  HEENT is normal.  Neck is supple.  Chest is clear to auscultation with normal expansion.  Cardiovascular exam is regular rate and rhythm.  Abdominal exam nontender or distended. No masses palpated. Extremities show no edema. neuro grossly intact

## 2014-08-24 NOTE — Patient Instructions (Addendum)
dyshidrotic eczema: Use the steroid cream twice a day for 2 weeks.  If the rash returns, use the cream again as needed.  Do not use longer because the cream can cause skin and pigment changes.  This will not be systemically absorbed to affect the blood sugars.

## 2014-08-24 NOTE — Assessment & Plan Note (Signed)
Continue verapamil. If he has recurrent episodes despite this medication we will consider ablation.

## 2014-08-24 NOTE — Assessment & Plan Note (Signed)
Patient counseled on discontinuing. 

## 2014-08-24 NOTE — Patient Instructions (Addendum)
Your physician wants you to follow-up in: [place] will receive a reminder letter in the mail two months in advance. If you don't receive a letter, please call our office to schedule the follow-up appointment.   STOP SIMVASTATIN  START PRAVASTATIN 40 MG ONCE DAILY  Your physician recommends that you return for lab work in: [place] PRAVASTATIN

## 2014-08-24 NOTE — Progress Notes (Signed)
Pre visit review using our clinic review tool, if applicable. No additional management support is needed unless otherwise documented below in the visit note. 

## 2014-08-24 NOTE — Assessment & Plan Note (Signed)
Discontinue Zocor given the potential interaction with verapamil. Begin Pravachol 40 mg daily. Check lipids and liver in 4 weeks.

## 2014-08-31 DIAGNOSIS — L299 Pruritus, unspecified: Secondary | ICD-10-CM | POA: Diagnosis not present

## 2014-08-31 DIAGNOSIS — I8311 Varicose veins of right lower extremity with inflammation: Secondary | ICD-10-CM | POA: Diagnosis not present

## 2014-08-31 DIAGNOSIS — L821 Other seborrheic keratosis: Secondary | ICD-10-CM | POA: Diagnosis not present

## 2014-09-18 ENCOUNTER — Other Ambulatory Visit: Payer: Self-pay | Admitting: Internal Medicine

## 2014-10-12 ENCOUNTER — Encounter: Payer: Medicare Other | Attending: Endocrinology | Admitting: *Deleted

## 2014-10-12 DIAGNOSIS — I10 Essential (primary) hypertension: Secondary | ICD-10-CM | POA: Diagnosis not present

## 2014-10-12 DIAGNOSIS — Z713 Dietary counseling and surveillance: Secondary | ICD-10-CM | POA: Insufficient documentation

## 2014-10-12 DIAGNOSIS — E1165 Type 2 diabetes mellitus with hyperglycemia: Secondary | ICD-10-CM | POA: Diagnosis not present

## 2014-10-12 DIAGNOSIS — E119 Type 2 diabetes mellitus without complications: Secondary | ICD-10-CM | POA: Diagnosis not present

## 2014-10-12 NOTE — Progress Notes (Signed)
Medical Nutrition Therapy: 10/12/2014  Appt start time:  1000    end time:  1030. Patient states his last A1c was 7.1 %   Assessment:  Primary concerns today: patient here for follow up diabetes education follow up visit. His wieght is stable at 250 pounds, he is happy with his control of his Diabetes overall but notes his A1c has increased to 8.5%. He has questions about whether he might consider a vegetarian diet. He also asked about Vegan but notes his MD strongly discouraged that at his visit with her this AM.  He has received a Rx to start on Lantus in the evenings He continues to appreciate the accountability by coming every 3 months which his insurance will cover @ 3 hours education per year.    TANITA  BODY COMP RESULTS on 07/21/13  Weight 255.5 lb   BMI (kg/m^2) 38.8   Fat Mass (lbs) 97.5 lb   Fat Free Mass (lbs) 158.0 lb   Total Body Water (lbs) 115.5 lb   TANITA  BODY COMP RESULTS on 10/21/13  Weight 257.5 lb   BMI (kg/m^2) 39.2 lb   Fat Mass (lbs) 97.5 lb   Fat Free Mass (lbs) 160.0 lb   Total Body Water (lbs) 117.0 lb   TANITA  BODY COMP RESULTS on 01/20/14  Weight 248.5 lb   BMI (kg/m^2) 37.8   Fat Mass (lbs) 96.0 lb   Fat Free Mass (lbs) 152.5 lb   Total Body Water (lbs) 111.5 lb   TANITA  BODY COMP RESULTS  Weight 250.0 lb   BMI (kg/m^2) 38.0   Fat Mass (lbs) 100.0 lb   Fat Free Mass (lbs) 150.0 lb   Total Body Water (lbs) 110.0 lb   TANITA  BODY COMP RESULTS  07/27/2014 Weight 249.0 lb   BMI (kg/m^2) 37.9   Fat Mass (lbs) 103.5   Fat Free Mass (lbs) 145.5   Total Body Water (lbs) 106.5 lb   TANITA  BODY COMP RESULTS 10/12/2014  Weight 251 lb   BMI (kg/m^2) 38.2   Fat Mass (lbs) 92.0 lb   Fat Free Mass (lbs) 159.0 lb   Total Body Water (lbs) 116.5 lb    Recent change in water intake. He states he was told recently to decrease Furosamide and his water intake due to a drop in his blood pressure. He states Dr. Chalmers Cater has just told him to take the Furosamide  only as needed but to resume his normal water intake. So the water weight in the Tanita Scale today is somewhat skewed in that regard.   MEDICATIONS: see list, Diabetes medications are now Invokana, Glimepiride, Metformin and Lantus insulin   Usual physical activity: walking as able    Intervention: Nutrition counseling and diabetes education continued. Encouraged him to continue with his current eating habits and increasing his activity level. Have recommended he see Lajean Saver at his next visit for her expertise in [place] choices. He has agreed. Reviewed technique for taking Lantus, which he states he has taken in the past. He expressed good understanding of use of pen and rotation of sights.   Plan:  Continue to aim for 3 Carb Choices per meal (45 grams) +/- 1 either way  Continue to aim for 0-2 Carbs per snack if hungry  Continue reading food labels for Total Carbohydrate and Fat Grams of foods Continue with your activity level by walking after supper on less active days for 20 minutes daily as tolerated Continue checking BG at  alternate times per day on occasion You are starting on Lantus tonight, rotate the sites and take at consistent time of day You will see Kathlee Nations S. at your next visit who will give you more information on vegetarian food choices.      Handouts given during visit include: Tanita Scale results handout  Monitoring/Evaluation:  Dietary intake, exercise, reading food labels, and body weight in 3 months.

## 2014-10-12 NOTE — Patient Instructions (Signed)
Plan:  Continue to aim for 3 Carb Choices per meal (45 grams) +/- 1 either way  Continue to aim for 0-2 Carbs per snack if hungry  Continue reading food labels for Total Carbohydrate and Fat Grams of foods Continue with your activity level by walking after supper on less active days for 20 minutes daily as tolerated Continue checking BG at alternate times per day on occasion You are starting on Lantus tonight, rotate the sites and take at consistent time of day You will see Kathlee Nations S. at your next visit who will give you more information on vegetarian food choices.

## 2014-10-13 DIAGNOSIS — G5622 Lesion of ulnar nerve, left upper limb: Secondary | ICD-10-CM | POA: Diagnosis not present

## 2014-10-13 DIAGNOSIS — M7702 Medial epicondylitis, left elbow: Secondary | ICD-10-CM | POA: Diagnosis not present

## 2014-10-17 DIAGNOSIS — M7702 Medial epicondylitis, left elbow: Secondary | ICD-10-CM | POA: Diagnosis not present

## 2014-11-17 DIAGNOSIS — Z6839 Body mass index (BMI) 39.0-39.9, adult: Secondary | ICD-10-CM | POA: Diagnosis not present

## 2014-11-17 DIAGNOSIS — M7702 Medial epicondylitis, left elbow: Secondary | ICD-10-CM | POA: Diagnosis not present

## 2014-11-17 DIAGNOSIS — G5601 Carpal tunnel syndrome, right upper limb: Secondary | ICD-10-CM | POA: Diagnosis not present

## 2014-12-07 ENCOUNTER — Encounter: Payer: Medicare Other | Attending: Endocrinology | Admitting: Dietician

## 2014-12-07 VITALS — Ht 68.0 in | Wt 258.0 lb

## 2014-12-07 DIAGNOSIS — R609 Edema, unspecified: Secondary | ICD-10-CM | POA: Diagnosis not present

## 2014-12-07 DIAGNOSIS — Z713 Dietary counseling and surveillance: Secondary | ICD-10-CM | POA: Diagnosis not present

## 2014-12-07 DIAGNOSIS — E119 Type 2 diabetes mellitus without complications: Secondary | ICD-10-CM | POA: Diagnosis not present

## 2014-12-07 DIAGNOSIS — E1165 Type 2 diabetes mellitus with hyperglycemia: Secondary | ICD-10-CM | POA: Diagnosis not present

## 2014-12-07 NOTE — Patient Instructions (Addendum)
Plan:  Continue to aim for 3 Carb Choices per meal (45 grams) +/- 1 either way  Continue to aim for 0-2 Carbs per snack if hungry  Continue reading food labels for Total Carbohydrate and Fat Grams of foods Continue with your activity level by walking after supper on less active days for 20 minutes daily as tolerated Continue checking BG at alternate times per day on occasion You are starting on Lantus tonight, rotate the sites and take at consistent time of day Aim to eat 3 meals per day and 2 snacks if you are hungry. Add some protein and a carb to salad meals. Have snacks carbs with protein (see list)

## 2014-12-07 NOTE — Progress Notes (Signed)
Medical Nutrition Therapy: 10/12/2014  Appt start time:  1100    end time:  1130. Patient states his last A1c was 7.1 % per patient  Assessment:  Primary concerns today: patient here for follow up diabetes education. Previously saw Jeanie Sewer, RD, CDE for diabetes education.  Has stopped smoking since previous visit! Has also gained about 8 lbs since past visit, those most of the weight gain has been mostly water. Is considering eating a vegetarian diet. Concerned about protein sources. Was told to increase his sodium intake since his blood pressure has been low. Has started eating salted peanuts and pretzels but doesn't eat a lot of salt.   Morning blood sugar readings are normally in the 90's, though today was slightly higher. Started taking Lantus d/t early morning blood sugar spikes.  Does not have much of an appetite for the past few years. Will skip about 10 meals per week.   Has been avoiding dark leafy greens d/t high iron content. Has a hx of elevated hemoglobin.   Uses a small plate for most meals.    TANITA  BODY COMP RESULTS on 07/21/13  Weight 255.5 lb   BMI (kg/m^2) 38.8   Fat Mass (lbs) 97.5 lb   Fat Free Mass (lbs) 158.0 lb   Total Body Water (lbs) 115.5 lb   TANITA  BODY COMP RESULTS on 10/21/13  Weight 257.5 lb   BMI (kg/m^2) 39.2 lb   Fat Mass (lbs) 97.5 lb   Fat Free Mass (lbs) 160.0 lb   Total Body Water (lbs) 117.0 lb   TANITA  BODY COMP RESULTS on 01/20/14  Weight 248.5 lb   BMI (kg/m^2) 37.8   Fat Mass (lbs) 96.0 lb   Fat Free Mass (lbs) 152.5 lb   Total Body Water (lbs) 111.5 lb   TANITA  BODY COMP RESULTS  Weight 250.0 lb   BMI (kg/m^2) 38.0   Fat Mass (lbs) 100.0 lb   Fat Free Mass (lbs) 150.0 lb   Total Body Water (lbs) 110.0 lb   TANITA  BODY COMP RESULTS  07/27/2014 Weight 249.0 lb   BMI (kg/m^2) 37.9   Fat Mass (lbs) 103.5   Fat Free Mass (lbs) 145.5   Total Body Water (lbs) 106.5 lb   TANITA  BODY COMP RESULTS 10/12/2014  Weight  251 lb   BMI (kg/m^2) 38.2   Fat Mass (lbs) 92.0 lb   Fat Free Mass (lbs) 159.0 lb   Total Body Water (lbs) 116.5 lb   TANITA  BODY COMP RESULTS 12/07/2014   Weight 258 lb   BMI (kg/m^2) 39.2   Fat Mass (lbs) 86.5 lb   Fat Free Mass (lbs) 171.5 lb   Total Body Water (lbs) 125.5 lb     Recent change in water intake. He states he was told recently to decrease Furosamide and his water intake due to a drop in his blood pressure. He states Dr. Chalmers Cater has just told him to take the Furosamide only as needed but to resume his normal water intake. So the water weight in the Tanita Scale today is somewhat skewed in that regard.   MEDICATIONS: see list, Diabetes medications are now Invokana, Glimepiride, Metformin and Lantus insulin   Usual physical activity: walking as able    Intervention: Nutrition counseling and diabetes education continued. Encouraged him to eat 3 meals per day and make sure he has a protein and carb with each meal.   Plan:  Continue to aim for 3 Carb Choices  per meal (45 grams) +/- 1 either way  Continue to aim for 0-2 Carbs per snack if hungry  Continue reading food labels for Total Carbohydrate and Fat Grams of foods Continue with your activity level by walking after supper on less active days for 20 minutes daily as tolerated Continue checking BG at alternate times per day on occasion You are starting on Lantus tonight, rotate the sites and take at consistent time of day Aim to eat 3 meals per day and 2 snacks if you are hungry. Add some protein and a carb to salad meals. Have snacks carbs with protein (see list)    Handouts given during visit include: Tanita Scale results handout [employer] Handout 15 g CHO Snacks  Monitoring/Evaluation:  Dietary intake, exercise, reading food labels, and body weight in 4 months.

## 2014-12-14 ENCOUNTER — Ambulatory Visit: Payer: Medicare Other | Admitting: Dietician

## 2014-12-14 ENCOUNTER — Encounter: Payer: Self-pay | Admitting: Family Medicine

## 2014-12-14 ENCOUNTER — Ambulatory Visit (INDEPENDENT_AMBULATORY_CARE_PROVIDER_SITE_OTHER): Payer: Medicare Other | Admitting: Family Medicine

## 2014-12-14 VITALS — BP 120/74 | HR 105 | Temp 98.6°F | Wt 260.0 lb

## 2014-12-14 DIAGNOSIS — H109 Unspecified conjunctivitis: Secondary | ICD-10-CM | POA: Diagnosis not present

## 2014-12-14 DIAGNOSIS — R208 Other disturbances of skin sensation: Secondary | ICD-10-CM | POA: Diagnosis not present

## 2014-12-14 MED ORDER — POLYMYXIN B-TRIMETHOPRIM 10000-0.1 UNIT/ML-% OP SOLN: 2.0000 [drp] | OPHTHALMIC | Status: DC

## 2014-12-14 NOTE — Progress Notes (Signed)
Pre visit review using our clinic review tool, if applicable. No additional management support is needed unless otherwise documented below in the visit note. 

## 2014-12-14 NOTE — Progress Notes (Signed)
Subjective:    Patient ID: Steve Charles, male    DOB: 12/11/1945, 69 y.o.   MRN: [id number]  HPI Patient seen for the following acute and new items today  He relates somewhat of a burning sensation mostly involving his anterior tongue but also some numbness which she estimates is the anterior half of the tongue. He's also noticed some diminish in taste recently. He has occasional burning sensation of the mouth. Denies any burning symptoms in the extremities. He has long-standing history of type 2 diabetes treated with metformin. No history of gastric surgery. No PPI use. No history of B12 deficiency. No recent change of toothpaste or mouthwash. No alleviating or exacerbating features. He has not noted a tongue edema.  No facial weakness  Watering from the left eye onset earlier today. He's had a little bit of yellowish drainage as the day has progressed. No eye pain. No blurred vision.  Past Medical History  Diagnosis Date  . DYSLIPIDEMIA 11/29/2009  . HYPERTENSION, ESSENTIAL 11/29/2009  . URTICARIA 02/06/2010  . BPH (benign prostatic hypertrophy)   . Osteoarthritis   . DM (diabetes mellitus)   . OSA (obstructive sleep apnea) 03/26/2011    CPAP 9 cm H2O  . COPD (chronic obstructive pulmonary disease) 05/01/2011    PFT 05/01/11>>FEV1 2.07(72%), FEV1% 65, TLC 6.79(111%), DLCO 69%, no BD  . CAD (coronary artery disease)   . SVT (supraventricular tachycardia)    Past Surgical History  Procedure Laterality Date  . Appendectomy  1963  . Spine surgery      spinal fusion - L4, L5  . Total knee arthroplasty  2007  . Transurethral resection of prostate      bph  . Nasal sinus surgery  1989    reports that he has been smoking Cigarettes.  He has a 50 pack-year smoking history. He has never used smokeless tobacco. He reports that he drinks alcohol. He reports that he does not use illicit drugs. family history includes Arthritis in his other; Cancer in his father; Diabetes in his other;  Prostate cancer in his brother. Allergies  Allergen Reactions  . Codeine Sulfate Hives    REACTION: hives  . Morphine And Related Hives    Oral morphine/ hives   . Propoxyphene N-Acetaminophen Hives    REACTION: hives  . Ramipril Hives    Hives       Review of Systems  Constitutional: Negative for fever and chills.  HENT: Negative for trouble swallowing.   Eyes: Positive for discharge. Negative for pain and visual disturbance.  Respiratory: Negative for cough and shortness of breath.   Cardiovascular: Negative for chest pain.  Neurological: Negative for dizziness, syncope, weakness and headaches.       Objective:   Physical Exam  Constitutional: He is oriented to person, place, and time. He appears well-developed and well-nourished. No distress.  HENT:  Right Ear: External ear normal.  Left Ear: External ear normal.  Eyes:  Left conjunctiva slightly erythematous. He has some yellow. One secretions in the inner canthus of left eye and along the lower lid. No injection of the eye itself. Cornea appears normal  Cardiovascular: Normal rate and regular rhythm.   Pulmonary/Chest: Effort normal and breath sounds normal. No respiratory distress. He has no wheezes. He has no rales.  Musculoskeletal:  Trace edema legs bilaterally  Neurological: He is alert and oriented to person, place, and time. Coordination normal.  No focal strength deficits. He has subjectively slightly diminished sensation distal one  half of the tongue-otherwise cranial nerves II through X intact. No facial weakness          Assessment & Plan:  #1 dysesthesias involving the tongue. He does describe some vague burning symptoms which are not localized. The numbness he describes (anterior two thirds tongue) is in a facial nerve distribution. Rule out B12 deficiency with chronic metformin use. Check B12 level. He does not have any evidence for obvious facial nerve weakness on exam at this time. Follow-up  promptly for any facial weakness or other new symptoms.  Burning mouth syndrome would be considered in differential but his current symptoms mostly involve the tongue. #2 acute conjunctivitis left eye. Suspect bacterial. Polytrim ophthalmic drops 2 drops left eye every 4 hours while awake. Warm compresses several times daily. Follow-up if not clearing over the next few days

## 2014-12-14 NOTE — Patient Instructions (Signed)

## 2014-12-15 LAB — VITAMIN B12: VITAMIN B 12: 197 pg/mL — AB (ref 211–911)

## 2014-12-19 ENCOUNTER — Other Ambulatory Visit: Payer: Self-pay | Admitting: Family Medicine

## 2014-12-19 ENCOUNTER — Ambulatory Visit (INDEPENDENT_AMBULATORY_CARE_PROVIDER_SITE_OTHER): Payer: Medicare Other | Admitting: Family Medicine

## 2014-12-19 ENCOUNTER — Encounter: Payer: Self-pay | Admitting: Family Medicine

## 2014-12-19 DIAGNOSIS — E538 Deficiency of other specified B group vitamins: Secondary | ICD-10-CM

## 2014-12-19 MED ORDER — CYANOCOBALAMIN 1000 MCG/ML IJ SOLN
1000.0000 ug | Freq: Once | INTRAMUSCULAR | Status: AC
  Administered 2014-12-19: 1000 ug via INTRAMUSCULAR

## 2014-12-28 ENCOUNTER — Telehealth: Payer: Self-pay | Admitting: Cardiology

## 2014-12-28 NOTE — Telephone Encounter (Signed)
Pt c/o swelling: STAT is pt has developed SOB within 24 hours  1. How long have you been experiencing swelling? 2 wks  2. Where is the swelling located? Left leg and both ankles  3.  Are you currently taking a "fluid pill"? He is on furosemide and Invokana  4.  Are you currently SOB? He did not specify  5.  Have you traveled recently?no

## 2014-12-28 NOTE — Telephone Encounter (Signed)
Spoke with pt, he is not having any SOB but the legs have been swollen for about 3 weeks. He is getting different information between [place] and dr balan regarding sodium and fluid intake. He has been placed on invokana and his furosemide was stopped. We told him to increase sodium and decrease fluids and dr Chalmers Cater has told him the opposite. He is wanting to get the edema better and would like direction. I have requested the last office note from dr balan to show dr Stanford Breed.

## 2014-12-29 NOTE — Telephone Encounter (Addendum)
Left message for patient to call.

## 2015-01-02 ENCOUNTER — Encounter: Payer: Self-pay | Admitting: Cardiology

## 2015-01-02 ENCOUNTER — Ambulatory Visit (INDEPENDENT_AMBULATORY_CARE_PROVIDER_SITE_OTHER): Payer: Medicare Other | Admitting: Cardiology

## 2015-01-02 VITALS — BP 124/62 | HR 98 | Ht 68.0 in | Wt 260.0 lb

## 2015-01-02 DIAGNOSIS — R609 Edema, unspecified: Secondary | ICD-10-CM | POA: Diagnosis not present

## 2015-01-02 DIAGNOSIS — I2510 Atherosclerotic heart disease of native coronary artery without angina pectoris: Secondary | ICD-10-CM

## 2015-01-02 DIAGNOSIS — I10 Essential (primary) hypertension: Secondary | ICD-10-CM

## 2015-01-02 DIAGNOSIS — Z720 Tobacco use: Secondary | ICD-10-CM

## 2015-01-02 DIAGNOSIS — I471 Supraventricular tachycardia: Secondary | ICD-10-CM

## 2015-01-02 DIAGNOSIS — I2583 Coronary atherosclerosis due to lipid rich plaque: Secondary | ICD-10-CM

## 2015-01-02 MED ORDER — FUROSEMIDE 40 MG PO TABS: 40.0000 mg | ORAL_TABLET | Freq: Every day | ORAL | Status: DC | PRN

## 2015-01-02 NOTE — Assessment & Plan Note (Signed)
- 

## 2015-01-02 NOTE — Assessment & Plan Note (Signed)
BP controlled; continue present meds. 

## 2015-01-02 NOTE — Patient Instructions (Signed)
Your physician wants you to follow-up in: [place] will receive a reminder letter in the mail two months in advance. If you don't receive a letter, please call our office to schedule the follow-up appointment.   TAKE FUROSEMIDE ONCE DAILY AS NEEDED

## 2015-01-02 NOTE — Telephone Encounter (Signed)
Spoke with pt, he will see dr Stanford Breed today at 4 pm

## 2015-01-02 NOTE — Progress Notes (Signed)
HPI: FU CAD and SVT. Patient states that he has had atrial fibrillation after surgeries in the past. Cardiac cath in April of 2012 revealed normal LV function, normal pulmonary pressures and pulmonary capillary wedge pressure and nonobstructive coronary disease with the most significant lesion being a 50% LAD. Carotid Dopplers in May of 2013 were normal. Echocardiogram repeated in Jan of 2015. This revealed normal LV function, grade 1 diastolic dysfunction. Nuclear study 7/15 showed EF 64 and normal perfusion. Patient also with SVT. Seen by Dr. Lovena Le and metoprolol changed to verapamil. Since last seen, the patient denies any dyspnea on exertion, orthopnea, PND, palpitations, syncope or chest pain. He does have chronic pedal edema.   Current Outpatient Prescriptions  Medication Sig Dispense Refill  . aspirin 81 MG tablet Take 81 mg by mouth daily.      . Canagliflozin (INVOKANA) 300 MG TABS Take 1 tablet by mouth daily.    . CHROMIUM-CINNAMON PO Take 2,000 mg by mouth daily.    . Cyanocobalamin (B-12 COMPLIANCE INJECTION IJ) Inject 1 Dose as directed every 14 (fourteen) days.    Marland Kitchen desoximetasone (TOPICORT) 0.25 % cream Apply 1 application topically 2 (two) times daily. 30 g 1  . furosemide (LASIX) 40 MG tablet Take 40 mg by mouth daily.     Marland Kitchen glimepiride (AMARYL) 1 MG tablet Take 1 mg by mouth 2 (two) times daily.     . insulin glargine (LANTUS) 100 UNIT/ML injection Inject 15 Units into the skin at bedtime.    . metFORMIN (GLUCOPHAGE) 500 MG tablet Take 2 tablets twice a day    . pravastatin (PRAVACHOL) 40 MG tablet Take 1 tablet (40 mg total) by mouth every evening. 90 tablet 3  . trimethoprim-polymyxin b (POLYTRIM) ophthalmic solution Place 2 drops into the left eye every 4 (four) hours. 10 mL 0  . Turmeric 450 MG CAPS Take 450 mg by mouth daily.    . verapamil (CALAN-SR) 240 MG CR tablet TAKE ONE TABLET EACH DAY 90 tablet 1   No current facility-administered medications for this  visit.     Past Medical History  Diagnosis Date  . DYSLIPIDEMIA 11/29/2009  . HYPERTENSION, ESSENTIAL 11/29/2009  . URTICARIA 02/06/2010  . BPH (benign prostatic hypertrophy)   . Osteoarthritis   . DM (diabetes mellitus)   . OSA (obstructive sleep apnea) 03/26/2011    CPAP 9 cm H2O  . COPD (chronic obstructive pulmonary disease) 05/01/2011    PFT 05/01/11>>FEV1 2.07(72%), FEV1% 65, TLC 6.79(111%), DLCO 69%, no BD  . CAD (coronary artery disease)   . SVT (supraventricular tachycardia)     Past Surgical History  Procedure Laterality Date  . Appendectomy  1963  . Spine surgery      spinal fusion - L4, L5  . Total knee arthroplasty  2007  . Transurethral resection of prostate      bph  . Nasal sinus surgery  1989    History   Social History  . Marital Status: Married    Spouse Name: N/A  . Number of Children: 3  . Years of Education: N/A   Occupational History  . Retired Other   Social History Main Topics  . Smoking status: Current Every Day Smoker -- 1.00 packs/day for 50 years    Types: Cigarettes  . Smokeless tobacco: Never Used     Comment: pt is again smoking < 1 PPD  . Alcohol Use: Yes     Comment: occasionallly  . Drug Use:  No  . Sexual Activity: Not on file   Other Topics Concern  . Not on file   Social History Narrative    ROS: no fevers or chills, productive cough, hemoptysis, dysphasia, odynophagia, melena, hematochezia, dysuria, hematuria, rash, seizure activity, orthopnea, PND, pedal edema, claudication. Remaining systems are negative.  Physical Exam: Well-developed well-nourished in no acute distress.  Skin is warm and dry.  HEENT is normal.  Neck is supple.  Chest is clear to auscultation with normal expansion.  Cardiovascular exam is regular rate and rhythm.  Abdominal exam nontender or distended. No masses palpated. Extremities show trace edema; mild erythema neuro grossly intact  ECG sinus rhythm at a rate of 98. Left axis deviation.  Cannot rule out prior septal infarct.

## 2015-01-02 NOTE — Assessment & Plan Note (Signed)
Pt has chronic edema most likely related to venous insuff. Given prescription for compression hose; keep feet elevated. Given that he is on invakana, will change lasix to 40 mg daily as needed.

## 2015-01-02 NOTE — Assessment & Plan Note (Signed)
Continue ASA and statin  

## 2015-01-02 NOTE — Assessment & Plan Note (Signed)
Continue statin. 

## 2015-01-02 NOTE — Assessment & Plan Note (Signed)
Pt counseled on discontinuing.

## 2015-01-04 ENCOUNTER — Ambulatory Visit (INDEPENDENT_AMBULATORY_CARE_PROVIDER_SITE_OTHER): Payer: Medicare Other | Admitting: Family Medicine

## 2015-01-04 ENCOUNTER — Ambulatory Visit: Payer: Self-pay | Admitting: Family Medicine

## 2015-01-04 DIAGNOSIS — E538 Deficiency of other specified B group vitamins: Secondary | ICD-10-CM | POA: Diagnosis not present

## 2015-01-04 MED ORDER — CYANOCOBALAMIN 1000 MCG/ML IJ SOLN
1000.0000 ug | Freq: Once | INTRAMUSCULAR | Status: AC
  Administered 2015-01-04: 1000 ug via INTRAMUSCULAR

## 2015-01-05 NOTE — Progress Notes (Signed)
Pt here for B12 injection only.  No office visit.

## 2015-01-13 ENCOUNTER — Emergency Department (HOSPITAL_COMMUNITY): Payer: Medicare Other

## 2015-01-13 ENCOUNTER — Emergency Department (HOSPITAL_COMMUNITY)
Admission: EM | Admit: 2015-01-13 | Discharge: 2015-01-13 | Disposition: A | Discharge status: Home / Self Care | Payer: Medicare Other | Attending: Emergency Medicine | Admitting: Emergency Medicine

## 2015-01-13 ENCOUNTER — Encounter (HOSPITAL_COMMUNITY): Payer: Self-pay | Admitting: Emergency Medicine

## 2015-01-13 DIAGNOSIS — E119 Type 2 diabetes mellitus without complications: Secondary | ICD-10-CM | POA: Diagnosis not present

## 2015-01-13 DIAGNOSIS — Z9981 Dependence on supplemental oxygen: Secondary | ICD-10-CM | POA: Insufficient documentation

## 2015-01-13 DIAGNOSIS — Z7982 Long term (current) use of aspirin: Secondary | ICD-10-CM | POA: Diagnosis not present

## 2015-01-13 DIAGNOSIS — I2510 Atherosclerotic heart disease of native coronary artery without angina pectoris: Secondary | ICD-10-CM | POA: Insufficient documentation

## 2015-01-13 DIAGNOSIS — Z794 Long term (current) use of insulin: Secondary | ICD-10-CM | POA: Insufficient documentation

## 2015-01-13 DIAGNOSIS — I471 Supraventricular tachycardia: Secondary | ICD-10-CM | POA: Insufficient documentation

## 2015-01-13 DIAGNOSIS — Z872 Personal history of diseases of the skin and subcutaneous tissue: Secondary | ICD-10-CM | POA: Diagnosis not present

## 2015-01-13 DIAGNOSIS — M542 Cervicalgia: Secondary | ICD-10-CM | POA: Diagnosis present

## 2015-01-13 DIAGNOSIS — Z87438 Personal history of other diseases of male genital organs: Secondary | ICD-10-CM | POA: Diagnosis not present

## 2015-01-13 DIAGNOSIS — Z720 Tobacco use: Secondary | ICD-10-CM | POA: Diagnosis not present

## 2015-01-13 DIAGNOSIS — E785 Hyperlipidemia, unspecified: Secondary | ICD-10-CM | POA: Insufficient documentation

## 2015-01-13 DIAGNOSIS — J449 Chronic obstructive pulmonary disease, unspecified: Secondary | ICD-10-CM | POA: Diagnosis not present

## 2015-01-13 DIAGNOSIS — M5032 Other cervical disc degeneration, mid-cervical region: Secondary | ICD-10-CM | POA: Diagnosis not present

## 2015-01-13 DIAGNOSIS — Z79899 Other long term (current) drug therapy: Secondary | ICD-10-CM | POA: Diagnosis not present

## 2015-01-13 DIAGNOSIS — M5030 Other cervical disc degeneration, unspecified cervical region: Secondary | ICD-10-CM | POA: Insufficient documentation

## 2015-01-13 DIAGNOSIS — R52 Pain, unspecified: Secondary | ICD-10-CM

## 2015-01-13 MED ORDER — METHOCARBAMOL 500 MG PO TABS: 1000.0000 mg | ORAL_TABLET | Freq: Three times a day (TID) | ORAL | Status: DC | PRN

## 2015-01-13 MED ORDER — METHOCARBAMOL 500 MG PO TABS
1000.0000 mg | ORAL_TABLET | Freq: Once | ORAL | Status: AC
  Administered 2015-01-13: 1000 mg via ORAL
  Filled 2015-01-13: qty 2

## 2015-01-13 MED ORDER — TRAMADOL HCL 50 MG PO TABS
50.0000 mg | ORAL_TABLET | Freq: Once | ORAL | Status: AC
Start: 2015-01-13 — End: 2015-01-13
  Administered 2015-01-13: 50 mg via ORAL
  Filled 2015-01-13: qty 1

## 2015-01-13 MED ORDER — TRAMADOL HCL 50 MG PO TABS: 50.0000 mg | ORAL_TABLET | Freq: Four times a day (QID) | ORAL | Status: DC | PRN

## 2015-01-13 NOTE — ED Notes (Signed)
Burning neck pain that radiates up the back of his head; started yesterday. Tried Ibuprofen and hydrocodone at home without relief. Denies injury. Denies any change in extremities.

## 2015-01-13 NOTE — ED Notes (Signed)
Patient transported to CT 

## 2015-01-13 NOTE — Discharge Instructions (Signed)
It was our pleasure to provide your ER care today - we hope that you feel better.  Try heat therapy/heating pad to painful area.  May try gentle massage of area.  Take motrin or aleve as need for pain.    You may also take ultram as need for pain - no driving when taking.  You may try robaxin as need for muscle spasm - no driving when taking.  Follow up with your neck and back specialist in the coming week.  Your CT scan was read as follows:  FINDINGS: No acute displaced fractures of the cervical spine. Straightening of normal cervical lordosis, likely chronic. Alignment is otherwise anatomic. Prevertebral soft tissues are normal. Severe multilevel degenerative disc disease, most evident at C3-C4, C5-C6, C6-C7 and C7-T1. Multilevel facet arthropathy. Visualized portions of the upper thorax are unremarkable.  IMPRESSION: 1. No acute radiographic abnormality of the cervical spine. 2. Severe multilevel degenerative disc disease and cervical spondylosis redemonstrated, as above.    You were given pain medication in the ER today - no driving for the next 4 hours.    Degenerative Disk Disease Degenerative disk disease is a condition caused by the changes that occur in the cushions of the backbone (spinal disks) as you grow older. Spinal disks are soft and compressible disks located between the bones of the spine (vertebrae). They act like shock absorbers. Degenerative disk disease can affect the whole spine. However, the neck and lower back are most commonly affected. Many changes can occur in the spinal disks with aging, such as:  The spinal disks may dry and shrink.  Small tears may occur in the tough, outer covering of the disk (annulus).  The disk space may become smaller due to loss of water.  Abnormal growths in the bone (spurs) may occur. This can put pressure on the nerve roots exiting the spinal canal, causing pain.  The spinal canal may become narrowed. CAUSES    Degenerative disk disease is a condition caused by the changes that occur in the spinal disks with aging. The exact cause is not known, but there is a genetic basis for many patients. Degenerative changes can occur due to loss of fluid in the disk. This makes the disk thinner and reduces the space between the backbones. Small cracks can develop in the outer layer of the disk. This can lead to the breakdown of the disk. You are more likely to get degenerative disk disease if you are overweight. Smoking cigarettes and doing heavy work such as weightlifting can also increase your risk of this condition. Degenerative changes can start after a sudden injury. Growth of bone spurs can compress the nerve roots and cause pain.  SYMPTOMS  The symptoms vary from person to person. Some people may have no pain, while others have severe pain. The pain may be so severe that it can limit your activities. The location of the pain depends on the part of your backbone that is affected. You will have neck or arm pain if a disk in the neck area is affected. You will have pain in your back, buttocks, or legs if a disk in the lower back is affected. The pain becomes worse while bending, reaching up, or with twisting movements. The pain may start gradually and then get worse as time passes. It may also start after a major or minor injury. You may feel numbness or tingling in the arms or legs.  DIAGNOSIS  Your caregiver will ask you about your  symptoms and about activities or habits that may cause the pain. He or she may also ask about any injuries, diseases, or treatments you have had earlier. Your caregiver will examine you to check for the range of movement that is possible in the affected area, to check for strength in your extremities, and to check for sensation in the areas of the arms and legs supplied by different nerve roots. An X-ray of the spine may be taken. Your caregiver may suggest other imaging tests, such as magnetic  resonance imaging (MRI), if needed.  TREATMENT  Treatment includes rest, modifying your activities, and applying ice and heat. Your caregiver may prescribe medicines to reduce your pain and may ask you to do some exercises to strengthen your back. In some cases, you may need surgery. You and your caregiver will decide on the treatment that is best for you. HOME CARE INSTRUCTIONS   Follow proper lifting and walking techniques as advised by your caregiver.  Maintain good posture.  Exercise regularly as advised.  Perform relaxation exercises.  Change your sitting, standing, and sleeping habits as advised. Change positions frequently.  Lose weight as advised.  Stop smoking if you smoke.  Wear supportive footwear. SEEK MEDICAL CARE IF:  Your pain does not go away within 1 to 4 weeks. SEEK IMMEDIATE MEDICAL CARE IF:   Your pain is severe.  You notice weakness in your arms, hands, or legs.  You begin to lose control of your bladder or bowel movements. MAKE SURE YOU:   Understand these instructions.  Will watch your condition.  Will get help right away if you are not doing well or get worse. Document Released: 08/17/2007 Document Revised: 01/12/2012 Document Reviewed: 02/21/2014 [facility] Patient Information 2015 [place], [place]. This information is not intended to replace advice given to you by your health care provider. Make sure you discuss any questions you have with your health care provider.      Cervical Sprain A cervical sprain is an injury in the neck in which the strong, fibrous tissues (ligaments) that connect your neck bones stretch or tear. Cervical sprains can range from mild to severe. Severe cervical sprains can cause the neck vertebrae to be unstable. This can lead to damage of the spinal cord and can result in serious nervous system problems. The amount of time it takes for a cervical sprain to get better depends on the cause and extent of the injury. Most  cervical sprains heal in 1 to 3 weeks. CAUSES  Severe cervical sprains may be caused by:   Contact sport injuries (such as from football, rugby, wrestling, hockey, auto racing, gymnastics, diving, martial arts, or boxing).   Motor vehicle collisions.   Whiplash injuries. This is an injury from a sudden forward and backward whipping movement of the head and neck.  Falls.  Mild cervical sprains may be caused by:   Being in an awkward position, such as while cradling a telephone between your ear and shoulder.   Sitting in a chair that does not offer proper support.   Working at a poorly Land[employer] station.   Looking up or down for long periods of time.  SYMPTOMS   Pain, soreness, stiffness, or a burning sensation in the front, back, or sides of the neck. This discomfort may develop immediately after the injury or slowly, 24 hours or more after the injury.   Pain or tenderness directly in the middle of the back of the neck.   Shoulder or upper  back pain.   Limited ability to move the neck.   Headache.   Dizziness.   Weakness, numbness, or tingling in the hands or arms.   Muscle spasms.   Difficulty swallowing or chewing.   Tenderness and swelling of the neck.  DIAGNOSIS  Most of the time your health care provider can diagnose a cervical sprain by taking your history and doing a physical exam. Your health care provider will ask about previous neck injuries and any known neck problems, such as arthritis in the neck. X-rays may be taken to find out if there are any other problems, such as with the bones of the neck. Other tests, such as a CT scan or MRI, may also be needed.  TREATMENT  Treatment depends on the severity of the cervical sprain. Mild sprains can be treated with rest, keeping the neck in place (immobilization), and pain medicines. Severe cervical sprains are immediately immobilized. Further treatment is done to help with pain, muscle spasms,  and other symptoms and may include:  Medicines, such as pain relievers, numbing medicines, or muscle relaxants.   Physical therapy. This may involve stretching exercises, strengthening exercises, and posture training. Exercises and improved posture can help stabilize the neck, strengthen muscles, and help stop symptoms from returning.  HOME CARE INSTRUCTIONS   Put ice on the injured area.   Put ice in a plastic bag.   Place a towel between your skin and the bag.   Leave the ice on for 15-20 minutes, 3-4 times a day.   If your injury was severe, you may have been given a cervical collar to wear. A cervical collar is a two-piece collar designed to keep your neck from moving while it heals.  Do not remove the collar unless instructed by your health care provider.  If you have long hair, keep it outside of the collar.  Ask your health care provider before making any adjustments to your collar. Minor adjustments may be required over time to improve comfort and reduce pressure on your chin or on the back of your head.  Ifyou are allowed to remove the collar for cleaning or bathing, follow your health care provider's instructions on how to do so safely.  Keep your collar clean by wiping it with mild soap and water and drying it completely. If the collar you have been given includes removable pads, remove them every 1-2 days and hand wash them with soap and water. Allow them to air dry. They should be completely dry before you wear them in the collar.  If you are allowed to remove the collar for cleaning and bathing, wash and dry the skin of your neck. Check your skin for irritation or sores. If you see any, tell your health care provider.  Do not drive while wearing the collar.   Only take over-the-counter or prescription medicines for pain, discomfort, or fever as directed by your health care provider.   Keep all follow-up appointments as directed by your health care provider.    Keep all physical therapy appointments as directed by your health care provider.   Make any needed adjustments to your workstation to promote good posture.   Avoid positions and activities that make your symptoms worse.   Warm up and stretch before being active to help prevent problems.  SEEK MEDICAL CARE IF:   Your pain is not controlled with medicine.   You are unable to decrease your pain medicine over time as planned.   Your activity  level is not improving as expected.  SEEK IMMEDIATE MEDICAL CARE IF:   You develop any bleeding.  You develop stomach upset.  You have signs of an allergic reaction to your medicine.   Your symptoms get worse.   You develop new, unexplained symptoms.   You have numbness, tingling, weakness, or paralysis in any part of your body.  MAKE SURE YOU:   Understand these instructions.  Will watch your condition.  Will get help right away if you are not doing well or get worse. Document Released: 08/17/2007 Document Revised: 10/25/2013 Document Reviewed: 04/27/2013 [facility] Patient Information 2015 [place], [place]. This information is not intended to replace advice given to you by your health care provider. Make sure you discuss any questions you have with your health care provider.   Heat Therapy Heat therapy can help ease sore, stiff, injured, and tight muscles and joints. Heat relaxes your muscles, which may help ease your pain.  RISKS AND COMPLICATIONS If you have any of the following conditions, do not use heat therapy unless your health care provider has approved:  Poor circulation.  Healing wounds or scarred skin in the area being treated.  Diabetes, heart disease, or high blood pressure.  Not being able to feel (numbness) the area being treated.  Unusual swelling of the area being treated.  Active infections.  Blood clots.  Cancer.  Inability to communicate pain. This may include young children and people who  have problems with their brain function (dementia).  Pregnancy. Heat therapy should only be used on old, pre-existing, or long-lasting (chronic) injuries. Do not use heat therapy on new injuries unless directed by your health care provider. HOW TO USE HEAT THERAPY There are several different kinds of heat therapy, including:  Moist heat pack.  Warm water bath.  Hot water bottle.  Electric heating pad.  Heated gel pack.  Heated wrap.  Electric heating pad. Use the heat therapy method suggested by your health care provider. Follow your health care provider's instructions on when and how to use heat therapy. GENERAL HEAT THERAPY RECOMMENDATIONS  Do not sleep while using heat therapy. Only use heat therapy while you are awake.  Your skin may turn pink while using heat therapy. Do not use heat therapy if your skin turns red.  Do not use heat therapy if you have new pain.  High heat or long exposure to heat can cause burns. Be careful when using heat therapy to avoid burning your skin.  Do not use heat therapy on areas of your skin that are already irritated, such as with a rash or sunburn. SEEK MEDICAL CARE IF:  You have blisters, redness, swelling, or numbness.  You have new pain.  Your pain is worse. MAKE SURE YOU:  Understand these instructions.  Will watch your condition.  Will get help right away if you are not doing well or get worse. Document Released: 01/12/2012 Document Revised: 03/06/2014 Document Reviewed: 12/13/2013 [facility] Patient Information 2015 [place], [place]. This information is not intended to replace advice given to you by your health care provider. Make sure you discuss any questions you have with your health care provider.

## 2015-01-13 NOTE — ED Notes (Signed)
Delayed waiting for CD from CT.

## 2015-01-13 NOTE — ED Notes (Signed)
Patient returned from CT

## 2015-01-13 NOTE — ED Provider Notes (Signed)
CSN: [id number]     Arrival date & time 01/13/15  6503 History   First MD Initiated Contact with Patient 01/13/15 [phone]     Chief Complaint  Patient presents with  . Neck Pain  . Headache     (Consider location/radiation/quality/duration/timing/severity/associated sxs/prior Treatment) Patient is a 69 y.o. male presenting with neck pain. The history is provided by the patient.  Neck Pain Associated symptoms: no chest pain and no headaches   pt c/o pain to posterior neck radiating up to bil scalp for the past couple days. Pain was gradual in onset, and was mild at onset. Pain is constant. Slowly worse, without acute or abrupt worsening.  Similar pain in past but this is 'different'.  Pain without consistent exacerbating or alleviating factors, although at times worse w certain changes in neck position and movement. No injury.no fall. No syncope. No headache, states pain on surface of scalp. No radicular/arm pain. No numbness/weakness. No change in speech or vision. No loss of normal functional ability. No fever or chills. No uri c/o. Hx ddd.      Past Medical History  Diagnosis Date  . DYSLIPIDEMIA 11/29/2009  . HYPERTENSION, ESSENTIAL 11/29/2009  . URTICARIA 02/06/2010  . BPH (benign prostatic hypertrophy)   . Osteoarthritis   . DM (diabetes mellitus)   . OSA (obstructive sleep apnea) 03/26/2011    CPAP 9 cm H2O  . COPD (chronic obstructive pulmonary disease) 05/01/2011    PFT 05/01/11>>FEV1 2.07(72%), FEV1% 65, TLC 6.79(111%), DLCO 69%, no BD  . CAD (coronary artery disease)   . SVT (supraventricular tachycardia)    Past Surgical History  Procedure Laterality Date  . Appendectomy  1963  . Spine surgery      spinal fusion - L4, L5  . Total knee arthroplasty  2007  . Transurethral resection of prostate      bph  . Nasal sinus surgery  1989  . Bilateral carpal tunnel release Bilateral    Family History  Problem Relation Age of Onset  . Arthritis Other   . Diabetes Other   .  Prostate cancer Brother   . Cancer Father    History  Substance Use Topics  . Smoking status: Current Every Day Smoker -- 1.00 packs/day for 50 years    Types: Cigarettes  . Smokeless tobacco: Never Used     Comment: pt is again smoking < 1 PPD  . Alcohol Use: Yes     Comment: occasionallly    Review of Systems  Constitutional: Negative for chills.  Eyes: Negative for redness.  Respiratory: Negative for shortness of breath.   Cardiovascular: Negative for chest pain.  Genitourinary: Negative for flank pain.  Musculoskeletal: Positive for neck pain.  Skin: Negative for rash and wound.  Neurological: Negative for syncope and headaches.  Hematological: Does not bruise/bleed easily.  Psychiatric/Behavioral: Negative for confusion.      Allergies  Codeine sulfate; Morphine and related; Propoxyphene n-acetaminophen; and Ramipril  Home Medications   Prior to Admission medications   Medication Sig Start Date End Date Taking? Authorizing Provider  aspirin 81 MG tablet Take 81 mg by mouth daily.      Historical Provider, MD  Canagliflozin (INVOKANA) 300 MG TABS Take 1 tablet by mouth daily.    Historical Provider, MD  CHROMIUM-CINNAMON PO Take 2,000 mg by mouth daily.    Historical Provider, MD  Cyanocobalamin (B-12 COMPLIANCE INJECTION IJ) Inject 1 Dose as directed every 14 (fourteen) days.    Historical Provider, MD  desoximetasone (  TOPICORT) 0.25 % cream Apply 1 application topically 2 (two) times daily. 08/24/14   [name], MD  furosemide (LASIX) 40 MG tablet Take 1 tablet (40 mg total) by mouth daily as needed. 01/02/15   Lelon Perla, MD  glimepiride (AMARYL) 1 MG tablet Take 1 mg by mouth 2 (two) times daily.     Historical Provider, MD  insulin glargine (LANTUS) 100 UNIT/ML injection Inject 15 Units into the skin at bedtime.    Historical Provider, MD  metFORMIN (GLUCOPHAGE) 500 MG tablet Take 2 tablets twice a day    Historical Provider, MD  pravastatin  (PRAVACHOL) 40 MG tablet Take 1 tablet (40 mg total) by mouth every evening. 08/24/14   Lelon Perla, MD  trimethoprim-polymyxin b (POLYTRIM) ophthalmic solution Place 2 drops into the left eye every 4 (four) hours. 12/14/14   [name], MD  Turmeric 450 MG CAPS Take 450 mg by mouth daily.    Historical Provider, MD  verapamil (CALAN-SR) 240 MG CR tablet TAKE ONE TABLET EACH DAY 09/19/14   Evans Lance, MD   BP 138/73 mmHg  Pulse 96  Temp(Src) 97.8 F (36.6 C) (Oral)  Resp 16  Ht 5\' 8"  (1.727 m)  Wt 250 lb (113.399 kg)  BMI 38.02 kg/m2  SpO2 96% Physical Exam  Constitutional: He is oriented to person, place, and time. He appears well-developed and well-nourished. No distress.  HENT:  Head: Atraumatic.  Mouth/Throat: Oropharynx is clear and moist.  No sinus or temporal tenderness. No scalp sts, redness, or rash.   Eyes: Conjunctivae are normal. Pupils are equal, round, and reactive to light.  Neck: Neck supple. No tracheal deviation present. No thyromegaly present.  Limited rom due to pain. No meningeal signs.   Cardiovascular: Normal rate, regular rhythm, normal heart sounds and intact distal pulses.  Exam reveals no gallop and no friction rub.   No murmur heard. Pulmonary/Chest: Effort normal and breath sounds normal. No accessory muscle usage. No respiratory distress. He exhibits no tenderness.  Abdominal: Soft. Bowel sounds are normal. He exhibits no distension and no mass. There is no tenderness. There is no rebound and no guarding.  Genitourinary:  No cva tenderness.   Musculoskeletal: Normal range of motion. He exhibits no edema or tenderness.  Lymphadenopathy:    He has no cervical adenopathy.  Neurological: He is alert and oriented to person, place, and time. No cranial nerve deficit.  Motor intact bil. stre 5/5. sens intact. No pronator drift.  Skin: Skin is warm and dry. No rash noted. He is not diaphoretic.  Psychiatric: He has a normal mood and affect.   Nursing note and vitals reviewed.   ED Course  Procedures (including critical care time)   Ct Cervical Spine Wo Contrast  01/13/2015   CLINICAL DATA:  69 year old male complaining of burning neck pain that radiates up into the back of the head since yesterday.  EXAM: CT CERVICAL SPINE WITHOUT CONTRAST  TECHNIQUE: Multidetector CT imaging of the cervical spine was performed without intravenous contrast. Multiplanar CT image reconstructions were also generated.  COMPARISON:  MRI of the cervical spine 02/10/2014.  FINDINGS: No acute displaced fractures of the cervical spine. Straightening of normal cervical lordosis, likely chronic. Alignment is otherwise anatomic. Prevertebral soft tissues are normal. Severe multilevel degenerative disc disease, most evident at C3-C4, C5-C6, C6-C7 and C7-T1. Multilevel facet arthropathy. Visualized portions of the upper thorax are unremarkable.  IMPRESSION: 1. No acute radiographic abnormality of the cervical spine. 2.  Severe multilevel degenerative disc disease and cervical spondylosis redemonstrated, as above.   Electronically Signed   By: Vinnie Langton M.D.   On: 01/13/2015 11:21      MDM  Ct.  Reviewed nursing notes and prior charts for additional history.   Robaxin po, ultram po.  Recheck pain improved.  Will try rx robaxin/ultram for home.  Pt states he will follow up with Dr Sherwood Gambler this week.     Lajean Saver, MD 01/13/15 1140

## 2015-01-16 DIAGNOSIS — Z6839 Body mass index (BMI) 39.0-39.9, adult: Secondary | ICD-10-CM | POA: Diagnosis not present

## 2015-01-16 DIAGNOSIS — M542 Cervicalgia: Secondary | ICD-10-CM | POA: Diagnosis not present

## 2015-01-16 DIAGNOSIS — M47812 Spondylosis without myelopathy or radiculopathy, cervical region: Secondary | ICD-10-CM | POA: Diagnosis not present

## 2015-01-16 DIAGNOSIS — M5030 Other cervical disc degeneration, unspecified cervical region: Secondary | ICD-10-CM | POA: Diagnosis not present

## 2015-01-18 ENCOUNTER — Ambulatory Visit (INDEPENDENT_AMBULATORY_CARE_PROVIDER_SITE_OTHER): Payer: Medicare Other | Admitting: Family Medicine

## 2015-01-18 DIAGNOSIS — E538 Deficiency of other specified B group vitamins: Secondary | ICD-10-CM

## 2015-01-18 DIAGNOSIS — R609 Edema, unspecified: Secondary | ICD-10-CM | POA: Diagnosis not present

## 2015-01-18 DIAGNOSIS — E1165 Type 2 diabetes mellitus with hyperglycemia: Secondary | ICD-10-CM | POA: Diagnosis not present

## 2015-01-18 MED ORDER — CYANOCOBALAMIN 1000 MCG/ML IJ SOLN
1000.0000 ug | Freq: Once | INTRAMUSCULAR | Status: AC
  Administered 2015-01-18: 1000 ug via INTRAMUSCULAR

## 2015-01-22 DIAGNOSIS — E780 Pure hypercholesterolemia: Secondary | ICD-10-CM | POA: Diagnosis not present

## 2015-01-22 DIAGNOSIS — R609 Edema, unspecified: Secondary | ICD-10-CM | POA: Diagnosis not present

## 2015-01-22 DIAGNOSIS — I10 Essential (primary) hypertension: Secondary | ICD-10-CM | POA: Diagnosis not present

## 2015-01-22 DIAGNOSIS — E1165 Type 2 diabetes mellitus with hyperglycemia: Secondary | ICD-10-CM | POA: Diagnosis not present

## 2015-01-25 DIAGNOSIS — E11329 Type 2 diabetes mellitus with mild nonproliferative diabetic retinopathy without macular edema: Secondary | ICD-10-CM | POA: Diagnosis not present

## 2015-01-25 DIAGNOSIS — Z961 Presence of intraocular lens: Secondary | ICD-10-CM | POA: Diagnosis not present

## 2015-01-25 LAB — HM DIABETES EYE EXAM

## 2015-02-01 ENCOUNTER — Ambulatory Visit (INDEPENDENT_AMBULATORY_CARE_PROVIDER_SITE_OTHER): Payer: Medicare Other | Admitting: Family Medicine

## 2015-02-01 DIAGNOSIS — E538 Deficiency of other specified B group vitamins: Secondary | ICD-10-CM

## 2015-02-01 MED ORDER — CYANOCOBALAMIN 1000 MCG/ML IJ SOLN
1000.0000 ug | Freq: Once | INTRAMUSCULAR | Status: AC
  Administered 2015-02-01: 1000 ug via INTRAMUSCULAR

## 2015-02-05 ENCOUNTER — Encounter: Payer: Self-pay | Admitting: Family Medicine

## 2015-02-09 DIAGNOSIS — M5030 Other cervical disc degeneration, unspecified cervical region: Secondary | ICD-10-CM | POA: Diagnosis not present

## 2015-02-09 DIAGNOSIS — M542 Cervicalgia: Secondary | ICD-10-CM | POA: Diagnosis not present

## 2015-02-09 DIAGNOSIS — M47812 Spondylosis without myelopathy or radiculopathy, cervical region: Secondary | ICD-10-CM | POA: Diagnosis not present

## 2015-02-09 DIAGNOSIS — Z6838 Body mass index (BMI) 38.0-38.9, adult: Secondary | ICD-10-CM | POA: Diagnosis not present

## 2015-02-22 ENCOUNTER — Ambulatory Visit: Payer: Medicare Other | Admitting: Cardiology

## 2015-03-01 DIAGNOSIS — R310 Gross hematuria: Secondary | ICD-10-CM | POA: Diagnosis not present

## 2015-03-01 DIAGNOSIS — N400 Enlarged prostate without lower urinary tract symptoms: Secondary | ICD-10-CM | POA: Diagnosis not present

## 2015-03-01 DIAGNOSIS — N508 Other specified disorders of male genital organs: Secondary | ICD-10-CM | POA: Diagnosis not present

## 2015-03-08 ENCOUNTER — Ambulatory Visit: Payer: Medicare Other | Admitting: Family Medicine

## 2015-03-09 ENCOUNTER — Ambulatory Visit (INDEPENDENT_AMBULATORY_CARE_PROVIDER_SITE_OTHER): Payer: Medicare Other | Admitting: Family Medicine

## 2015-03-09 DIAGNOSIS — E538 Deficiency of other specified B group vitamins: Secondary | ICD-10-CM

## 2015-03-09 MED ORDER — CYANOCOBALAMIN 1000 MCG/ML IJ SOLN
1000.0000 ug | Freq: Once | INTRAMUSCULAR | Status: AC
  Administered 2015-03-09: 1000 ug via INTRAMUSCULAR

## 2015-03-26 ENCOUNTER — Telehealth: Payer: Self-pay | Admitting: Cardiology

## 2015-03-26 DIAGNOSIS — E785 Hyperlipidemia, unspecified: Secondary | ICD-10-CM

## 2015-03-26 MED ORDER — PRAVASTATIN SODIUM 40 MG PO TABS: 40.0000 mg | ORAL_TABLET | Freq: Every evening | ORAL | Status: DC

## 2015-03-26 NOTE — Telephone Encounter (Signed)
°  1. Which medications need to be refilled? Pravachol   2. Which pharmacy is medication to be sent to?[facility])  3. Do they need a 30 day or 90 day supply? 90  4. Would they like a call back once the medication has been sent to the pharmacy? Yes

## 2015-03-26 NOTE — Telephone Encounter (Signed)
Refill submitted to patient's preferred pharmacy. Informed patient. Pt voiced understanding, no other stated concerns at this time.  

## 2015-03-29 ENCOUNTER — Ambulatory Visit (INDEPENDENT_AMBULATORY_CARE_PROVIDER_SITE_OTHER): Payer: Medicare Other | Admitting: Family Medicine

## 2015-03-29 ENCOUNTER — Encounter: Payer: Self-pay | Admitting: Family Medicine

## 2015-03-29 VITALS — BP 124/70 | HR 103 | Temp 98.3°F | Ht 68.0 in | Wt 247.0 lb

## 2015-03-29 DIAGNOSIS — E119 Type 2 diabetes mellitus without complications: Secondary | ICD-10-CM

## 2015-03-29 DIAGNOSIS — I2510 Atherosclerotic heart disease of native coronary artery without angina pectoris: Secondary | ICD-10-CM

## 2015-03-29 DIAGNOSIS — E785 Hyperlipidemia, unspecified: Secondary | ICD-10-CM

## 2015-03-29 DIAGNOSIS — D751 Secondary polycythemia: Secondary | ICD-10-CM

## 2015-03-29 DIAGNOSIS — Z0000 Encounter for general adult medical examination without abnormal findings: Secondary | ICD-10-CM | POA: Diagnosis not present

## 2015-03-29 DIAGNOSIS — E538 Deficiency of other specified B group vitamins: Secondary | ICD-10-CM | POA: Diagnosis not present

## 2015-03-29 DIAGNOSIS — I10 Essential (primary) hypertension: Secondary | ICD-10-CM

## 2015-03-29 DIAGNOSIS — E138 Other specified diabetes mellitus with unspecified complications: Secondary | ICD-10-CM | POA: Diagnosis not present

## 2015-03-29 LAB — CBC WITH DIFFERENTIAL/PLATELET
BASOS ABS: 0.1 10*3/uL (ref 0.0–0.1)
Basophils Relative: 0.5 % (ref 0.0–3.0)
EOS PCT: 1.9 % (ref 0.0–5.0)
Eosinophils Absolute: 0.2 10*3/uL (ref 0.0–0.7)
HCT: 53.6 % — ABNORMAL HIGH (ref 39.0–52.0)
HEMOGLOBIN: 17.8 g/dL — AB (ref 13.0–17.0)
LYMPHS PCT: 21 % (ref 12.0–46.0)
Lymphs Abs: 2.6 10*3/uL (ref 0.7–4.0)
MCHC: 33.1 g/dL (ref 30.0–36.0)
MCV: 89.5 fl (ref 78.0–100.0)
MONOS PCT: 8.3 % (ref 3.0–12.0)
Monocytes Absolute: 1 10*3/uL (ref 0.1–1.0)
NEUTROS PCT: 68.3 % (ref 43.0–77.0)
Neutro Abs: 8.5 10*3/uL — ABNORMAL HIGH (ref 1.4–7.7)
Platelets: 276 10*3/uL (ref 150.0–400.0)
RBC: 5.99 Mil/uL — AB (ref 4.22–5.81)
RDW: 15.1 % (ref 11.5–15.5)
WBC: 12.5 10*3/uL — ABNORMAL HIGH (ref 4.0–10.5)

## 2015-03-29 LAB — HEPATIC FUNCTION PANEL
ALT: 17 U/L (ref 0–53)
AST: 16 U/L (ref 0–37)
Albumin: 4.5 g/dL (ref 3.5–5.2)
Alkaline Phosphatase: 92 U/L (ref 39–117)
BILIRUBIN DIRECT: 0.2 mg/dL (ref 0.0–0.3)
BILIRUBIN TOTAL: 0.8 mg/dL (ref 0.2–1.2)
TOTAL PROTEIN: 8 g/dL (ref 6.0–8.3)

## 2015-03-29 LAB — BASIC METABOLIC PANEL
BUN: 15 mg/dL (ref 6–23)
CHLORIDE: 98 meq/L (ref 96–112)
CO2: 30 mEq/L (ref 19–32)
Calcium: 9.5 mg/dL (ref 8.4–10.5)
Creatinine, Ser: 0.8 mg/dL (ref 0.40–1.50)
GFR: 101.53 mL/min (ref >60.00)
GLUCOSE: 136 mg/dL — AB (ref 70–99)
Potassium: 3.5 mEq/L (ref 3.5–5.1)
Sodium: 137 mEq/L (ref 135–145)

## 2015-03-29 LAB — LIPID PANEL
Cholesterol: 141 mg/dL (ref 0–200)
HDL: 34.6 mg/dL — ABNORMAL LOW (ref >39.00)
LDL Cholesterol: 86 mg/dL (ref 0–99)
NonHDL: 106.4
Total CHOL/HDL Ratio: 4
Triglycerides: 102 mg/dL (ref 0.0–149.0)
VLDL: 20.4 mg/dL (ref 0.0–40.0)

## 2015-03-29 LAB — POCT GLUCOSE (DEVICE FOR HOME USE): Glucose Fasting, POC: 148 mg/dL — AB (ref 70–99)

## 2015-03-29 LAB — HEMOGLOBIN A1C: HEMOGLOBIN A1C: 6.6 % — AB (ref 4.6–6.5)

## 2015-03-29 LAB — VITAMIN B12

## 2015-03-29 MED ORDER — CYANOCOBALAMIN 1000 MCG/ML IJ SOLN
1000.0000 ug | Freq: Once | INTRAMUSCULAR | Status: AC
  Administered 2015-03-29: 1000 ug via INTRAMUSCULAR

## 2015-03-29 NOTE — Progress Notes (Signed)
Subjective:    Patient ID: Steve Charles, male    DOB: 05-18-1946, 69 y.o.   MRN: [id number]  HPI Patient seen for Medicare wellness exam-(subsequent annual) and medical follow-up.  Chronic problems include obesity, type 2 diabetes, dyslipidemia, hypertension, BPH, osteoarthritis, COPD, ongoing nicotine use, history of CAD, obstructive sleep apnea, history of TIA, secondary polycythemia related to smoking.  Still smoking and fairly low motivation to quit.  Recently diagnosed B12 deficiency. He has been getting injections currently monthly. Needs follow-up B12 level. Has noted some increase in energy since starting B12 replacement. Type 2 diabetes followed by endocrinology. A1c's have been improving. Sees a dietitian regularly.  Hyperlipidemia treated with pravastatin. No major myalgias. Blood pressures been well controlled. No recent chest pains.  Had one recent episode of gross hematuria and has scheduled cystoscopy per urology next week no further episodes since then.  No recent burning with urination   Reviewed with no changes: Past Medical History  Diagnosis Date  . DYSLIPIDEMIA 11/29/2009  . HYPERTENSION, ESSENTIAL 11/29/2009  . URTICARIA 02/06/2010  . BPH (benign prostatic hypertrophy)   . Osteoarthritis   . DM (diabetes mellitus)   . OSA (obstructive sleep apnea) 03/26/2011    CPAP 9 cm H2O  . COPD (chronic obstructive pulmonary disease) 05/01/2011    PFT 05/01/11>>FEV1 2.07(72%), FEV1% 65, TLC 6.79(111%), DLCO 69%, no BD  . CAD (coronary artery disease)   . SVT (supraventricular tachycardia)    Past Surgical History  Procedure Laterality Date  . Appendectomy  1963  . Spine surgery      spinal fusion - L4, L5  . Total knee arthroplasty  2007  . Transurethral resection of prostate      bph  . Nasal sinus surgery  1989  . Bilateral carpal tunnel release Bilateral     reports that he has been smoking Cigarettes.  He has a 50 pack-year smoking history. He has never used  smokeless tobacco. He reports that he drinks alcohol. He reports that he does not use illicit drugs. family history includes Arthritis in his other; Cancer in his father; Diabetes in his other; Prostate cancer in his brother. Allergies  Allergen Reactions  . Shellfish Allergy Hives  . Codeine Sulfate Hives    REACTION: hives  . Morphine And Related Hives    Oral morphine/ hives   . Propoxyphene N-Acetaminophen Hives    REACTION: hives  . Ramipril Hives    Hives    1.  Risk factors based on Past Medical , Social, and Family history reviewed and as indicated above with no changes 2.  Limitations in physical activities None.  No recent falls. 3.  Depression/mood No active depression or anxiety issues 4.  Hearing No defiits 5.  ADLs independent in all. 6.  Cognitive function (orientation to time and place, language, writing, speech,memory) no short or long term memory issues.  Language and judgement intact. 7.  Home Safety no issues 8.  Height, weight, and visual acuity.all stable. 9.  Counseling discussed needs to lose some more weight. We discussed smoking cessation. Low motivation to quit. 10. Recommendation of preventive services. Continue yearly flu vaccine. Other immunizations up-to-date 11. Labs based on risk factors-B12, lipid, hepatic, basic metabolic panel, CBC 12. Care Plan as above 13. Other Providers 14. Written schedule of screening/prevention services given to patient.    Review of Systems  Constitutional: Negative for fatigue.  Eyes: Negative for visual disturbance.  Respiratory: Negative for cough, chest tightness and shortness of breath.  Cardiovascular: Negative for chest pain, palpitations and leg swelling.  Endocrine: Negative for polydipsia and polyuria.  Genitourinary: Negative for dysuria.  Musculoskeletal: Positive for arthralgias.  Neurological: Negative for dizziness, syncope, weakness, light-headedness and headaches.       Objective:   Physical  Exam  Constitutional: He is oriented to person, place, and time. He appears well-developed and well-nourished. No distress.  HENT:  Right Ear: External ear normal.  Left Ear: External ear normal.  Mouth/Throat: Oropharynx is clear and moist.  Eyes: Pupils are equal, round, and reactive to light.  Neck: Neck supple. No JVD present. No thyromegaly present.  Cardiovascular: Normal rate and regular rhythm.   Pulmonary/Chest: Effort normal and breath sounds normal. No respiratory distress. He has no wheezes. He has no rales.  Abdominal: Soft. Bowel sounds are normal. He exhibits no distension and no mass. There is no tenderness. There is no rebound and no guarding.  Genitourinary:  Per urology  Musculoskeletal: He exhibits no edema.  Neurological: He is alert and oriented to person, place, and time. No cranial nerve deficit.  Skin:  Chronic hyperpigmentary changes lower extremities consistent with venous stasis  Psychiatric: He has a normal mood and affect. His behavior is normal.          Assessment & Plan:  #1 Medicare wellness exam. We discussed advanced directives. He is looking at getting some things in place including living will. Continue yearly flu vaccine. Tetanus up-to-date. Previous shingles vaccine given. Colonoscopy up-to-date. #2 type 2 diabetes. This has been followed by endocrinology and he is requesting A1c today. #3 hyperlipidemia. Recheck lipid and hepatic panel #4 B12 deficiency. Recheck B12 level. Consider transition to oral replacement if levels are back to normal. #5 ongoing nicotine use. We discussed low-dose CT scanning for lung cancer and he does appear to be a candidate. He was given information on this and will consider but is undecided at this point.

## 2015-03-29 NOTE — Progress Notes (Signed)
Pre visit review using our clinic review tool, if applicable. No additional management support is needed unless otherwise documented below in the visit note. 

## 2015-03-29 NOTE — Patient Instructions (Signed)
Continue with yearly flu vaccine. Smoking Cessation Quitting smoking is important to your health and has many advantages. However, it is not always easy to quit since nicotine is a very addictive drug. Oftentimes, people try 3 times or more before being able to quit. This document explains the best ways for you to prepare to quit smoking. Quitting takes hard work and a lot of effort, but you can do it. ADVANTAGES OF QUITTING SMOKING  You will live longer, feel better, and live better.  Your body will feel the impact of quitting smoking almost immediately.  Within 20 minutes, blood pressure decreases. Your pulse returns to its normal level.  After 8 hours, carbon monoxide levels in the blood return to normal. Your oxygen level increases.  After 24 hours, the chance of having a heart attack starts to decrease. Your breath, hair, and body stop smelling like smoke.  After 48 hours, damaged nerve endings begin to recover. Your sense of taste and smell improve.  After 72 hours, the body is virtually free of nicotine. Your bronchial tubes relax and breathing becomes easier.  After 2 to 12 weeks, lungs can hold more air. Exercise becomes easier and circulation improves.  The risk of having a heart attack, stroke, cancer, or lung disease is greatly reduced.  After 1 year, the risk of coronary heart disease is cut in half.  After 5 years, the risk of stroke falls to the same as a nonsmoker.  After 10 years, the risk of lung cancer is cut in half and the risk of other cancers decreases significantly.  After 15 years, the risk of coronary heart disease drops, usually to the level of a nonsmoker.  If you are pregnant, quitting smoking will improve your chances of having a healthy baby.  The people you live with, especially any children, will be healthier.  You will have extra money to spend on things other than cigarettes. QUESTIONS TO THINK ABOUT BEFORE ATTEMPTING TO QUIT You may want to  talk about your answers with your health care provider.  Why do you want to quit?  If you tried to quit in the past, what helped and what did not?  What will be the most difficult situations for you after you quit? How will you plan to handle them?  Who can help you through the tough times? Your family? Friends? A health care provider?  What pleasures do you get from smoking? What ways can you still get pleasure if you quit? Here are some questions to ask your health care provider:  How can you help me to be successful at quitting?  What medicine do you think would be best for me and how should I take it?  What should I do if I need more help?  What is smoking withdrawal like? How can I get information on withdrawal? GET READY  Set a quit date.  Change your environment by getting rid of all cigarettes, ashtrays, matches, and lighters in your home, car, or work. Do not let people smoke in your home.  Review your past attempts to quit. Think about what worked and what did not. GET SUPPORT AND ENCOURAGEMENT You have a better chance of being successful if you have help. You can get support in many ways.  Tell your family, friends, and coworkers that you are going to quit and need their support. Ask them not to smoke around you.  Get individual, group, or telephone counseling and support. Programs are available at local  hospitals and health centers. Call your local health department for information about programs in your area.  Spiritual beliefs and practices may help some smokers quit.  Download a "quit meter" on your computer to keep track of quit statistics, such as how long you have gone without smoking, cigarettes not smoked, and money saved.  Get a self-help book about quitting smoking and staying off tobacco. [place] yourself from urges to smoke. Talk to someone, go for a walk, or occupy your time with a task.  Change your normal routine.  Take a different route to work. Drink tea instead of coffee. Eat breakfast in a different place.  Reduce your stress. Take a hot bath, exercise, or read a book.  Plan something enjoyable to do every day. Reward yourself for not smoking.  Explore interactive web-based programs that specialize in helping you quit. GET MEDICINE AND USE IT CORRECTLY Medicines can help you stop smoking and decrease the urge to smoke. Combining medicine with the above behavioral methods and support can greatly increase your chances of successfully quitting smoking.  Nicotine replacement therapy helps deliver nicotine to your body without the negative effects and risks of smoking. Nicotine replacement therapy includes nicotine gum, lozenges, inhalers, nasal sprays, and skin patches. Some may be available over-the-counter and others require a prescription.  Antidepressant medicine helps people abstain from smoking, but how this works is unknown. This medicine is available by prescription.  Nicotinic receptor partial agonist medicine simulates the effect of nicotine in your brain. This medicine is available by prescription. Ask your health care provider for advice about which medicines to use and how to use them based on your health history. Your health care provider will tell you what side effects to look out for if you choose to be on a medicine or therapy. Carefully read the information on the package. Do not use any other product containing nicotine while using a nicotine replacement product.  RELAPSE OR DIFFICULT SITUATIONS Most relapses occur within the first 3 months after quitting. Do not be discouraged if you start smoking again. Remember, most people try several times before finally quitting. You may have symptoms of withdrawal because your body is used to nicotine. You may crave cigarettes, be irritable, feel very hungry, cough often, get headaches, or have difficulty concentrating. The withdrawal symptoms are  only temporary. They are strongest when you first quit, but they will go away within 10-14 days. To reduce the chances of relapse, try to:  Avoid drinking alcohol. Drinking lowers your chances of successfully quitting.  Reduce the amount of caffeine you consume. Once you quit smoking, the amount of caffeine in your body increases and can give you symptoms, such as a rapid heartbeat, sweating, and anxiety.  Avoid smokers because they can make you want to smoke.  Do not let weight gain distract you. Many smokers will gain weight when they quit, usually less than 10 pounds. Eat a healthy diet and stay active. You can always lose the weight gained after you quit.  Find ways to improve your mood other than smoking. FOR MORE INFORMATION  www.smokefree.gov  Document Released: 10/14/2001 Document Revised: 03/06/2014 Document Reviewed: 01/29/2012 [facility] Patient Information 2015 [place], [place]. This information is not intended to replace advice given to you by your health care provider. Make sure you discuss any questions you have with your health care provider.

## 2015-03-30 ENCOUNTER — Telehealth: Payer: Self-pay

## 2015-04-05 DIAGNOSIS — N400 Enlarged prostate without lower urinary tract symptoms: Secondary | ICD-10-CM | POA: Diagnosis not present

## 2015-04-05 DIAGNOSIS — R310 Gross hematuria: Secondary | ICD-10-CM | POA: Diagnosis not present

## 2015-04-07 ENCOUNTER — Other Ambulatory Visit: Payer: Self-pay | Admitting: Internal Medicine

## 2015-04-29 ENCOUNTER — Encounter (HOSPITAL_COMMUNITY): Payer: Self-pay | Admitting: *Deleted

## 2015-04-29 ENCOUNTER — Emergency Department (HOSPITAL_COMMUNITY)
Admission: EM | Admit: 2015-04-29 | Discharge: 2015-04-29 | Disposition: A | Discharge status: Home / Self Care | Payer: Medicare Other | Attending: Emergency Medicine | Admitting: Emergency Medicine

## 2015-04-29 DIAGNOSIS — Z9981 Dependence on supplemental oxygen: Secondary | ICD-10-CM | POA: Diagnosis not present

## 2015-04-29 DIAGNOSIS — Z720 Tobacco use: Secondary | ICD-10-CM | POA: Insufficient documentation

## 2015-04-29 DIAGNOSIS — M1990 Unspecified osteoarthritis, unspecified site: Secondary | ICD-10-CM | POA: Insufficient documentation

## 2015-04-29 DIAGNOSIS — I2510 Atherosclerotic heart disease of native coronary artery without angina pectoris: Secondary | ICD-10-CM | POA: Diagnosis not present

## 2015-04-29 DIAGNOSIS — E785 Hyperlipidemia, unspecified: Secondary | ICD-10-CM | POA: Insufficient documentation

## 2015-04-29 DIAGNOSIS — I10 Essential (primary) hypertension: Secondary | ICD-10-CM | POA: Insufficient documentation

## 2015-04-29 DIAGNOSIS — G4733 Obstructive sleep apnea (adult) (pediatric): Secondary | ICD-10-CM | POA: Insufficient documentation

## 2015-04-29 DIAGNOSIS — R21 Rash and other nonspecific skin eruption: Secondary | ICD-10-CM | POA: Insufficient documentation

## 2015-04-29 DIAGNOSIS — E119 Type 2 diabetes mellitus without complications: Secondary | ICD-10-CM | POA: Diagnosis not present

## 2015-04-29 DIAGNOSIS — K047 Periapical abscess without sinus: Secondary | ICD-10-CM | POA: Insufficient documentation

## 2015-04-29 DIAGNOSIS — J449 Chronic obstructive pulmonary disease, unspecified: Secondary | ICD-10-CM | POA: Insufficient documentation

## 2015-04-29 DIAGNOSIS — Z79899 Other long term (current) drug therapy: Secondary | ICD-10-CM | POA: Diagnosis not present

## 2015-04-29 DIAGNOSIS — Z872 Personal history of diseases of the skin and subcutaneous tissue: Secondary | ICD-10-CM | POA: Insufficient documentation

## 2015-04-29 DIAGNOSIS — R509 Fever, unspecified: Secondary | ICD-10-CM | POA: Insufficient documentation

## 2015-04-29 DIAGNOSIS — Z7982 Long term (current) use of aspirin: Secondary | ICD-10-CM | POA: Diagnosis not present

## 2015-04-29 DIAGNOSIS — K088 Other specified disorders of teeth and supporting structures: Secondary | ICD-10-CM | POA: Diagnosis present

## 2015-04-29 LAB — CBC
HEMATOCRIT: 53.6 % — AB (ref 39.0–52.0)
Hemoglobin: 17.8 g/dL — ABNORMAL HIGH (ref 13.0–17.0)
MCH: 30.3 pg (ref 26.0–34.0)
MCHC: 33.2 g/dL (ref 30.0–36.0)
MCV: 91.3 fL (ref 78.0–100.0)
Platelets: 221 10*3/uL (ref 150–400)
RBC: 5.87 MIL/uL — ABNORMAL HIGH (ref 4.22–5.81)
RDW: 14.5 % (ref 11.5–15.5)
WBC: 14 10*3/uL — AB (ref 4.0–10.5)

## 2015-04-29 LAB — BASIC METABOLIC PANEL
Anion gap: 14 (ref 5–15)
BUN: 13 mg/dL (ref 6–20)
CO2: 28 mmol/L (ref 22–32)
Calcium: 9 mg/dL (ref 8.9–10.3)
Chloride: 96 mmol/L — ABNORMAL LOW (ref 101–111)
Creatinine, Ser: 0.96 mg/dL (ref 0.61–1.24)
GFR calc non Af Amer: >60 mL/min (ref >60)
Glucose, Bld: 124 mg/dL — ABNORMAL HIGH (ref 65–99)
POTASSIUM: 4 mmol/L (ref 3.5–5.1)
Sodium: 138 mmol/L (ref 135–145)

## 2015-04-29 LAB — I-STAT TROPONIN, ED: Troponin i, poc: 0 ng/mL (ref 0.00–0.08)

## 2015-04-29 MED ORDER — FENTANYL 25 MCG/HR TD PT72
25.0000 ug | MEDICATED_PATCH | TRANSDERMAL | Status: DC
  Administered 2015-04-29: 25 ug via TRANSDERMAL
  Filled 2015-04-29: qty 1

## 2015-04-29 MED ORDER — AMOXICILLIN 500 MG PO CAPS
500.0000 mg | ORAL_CAPSULE | Freq: Once | ORAL | Status: AC
  Administered 2015-04-29: 500 mg via ORAL
  Filled 2015-04-29: qty 1

## 2015-04-29 MED ORDER — FENTANYL CITRATE (PF) 100 MCG/2ML IJ SOLN
50.0000 ug | Freq: Once | INTRAMUSCULAR | Status: AC
  Administered 2015-04-29: 50 ug via INTRAVENOUS
  Filled 2015-04-29: qty 2

## 2015-04-29 MED ORDER — AMOXICILLIN 500 MG PO CAPS: 500.0000 mg | ORAL_CAPSULE | Freq: Two times a day (BID) | ORAL | Status: DC

## 2015-04-29 NOTE — ED Notes (Addendum)
Pt reports dental pain, upper left jaw,bicuspid tooth, pt has not been able to get in touch with dentist.  Pt took tramadol tablet around 0500. At 1100 pt started having violent chills and shaking, so pt came to ED. Pt took benadryl after shaking started. At present pts arm shaking slightly.  Pt reports normal high heart rate, hx of SVT.

## 2015-04-29 NOTE — ED Notes (Addendum)
md at bedside  Pt alert and oriented x4. Respirations even and unlabored, bilateral symmetrical rise and fall of chest. Skin warm and dry. In no acute distress. Denies needs.   

## 2015-04-29 NOTE — ED Provider Notes (Signed)
CSN: [id number]     Arrival date & time 04/29/15  1209 History   First MD Initiated Contact with Patient 04/29/15 1236     Chief Complaint  Patient presents with  . Dental Pain  . Chills     (Consider location/radiation/quality/duration/timing/severity/associated sxs/prior Treatment) HPI Patient presents with concern of right ear, chills, mouth pain. Symptoms began about 3 days ago, with mild discomfort about the left lateral upper teeth. Since that time pain in that area has become more severe, with anterior maxillary pain. As the pain has increased, patient is taking all medication for relief. Patient notes he had one episode of hives after taking oxycodone, these have subsided. This morning the patient also developed rigors, chills without objective fever at home.  Patient denies chest pain, dyspnea, belly pain, nausea, vomiting. Patient has been unable to obtain care from his dentist during this illness.  Patient has baseline heart rate 80/120 according to him.   Past Medical History  Diagnosis Date  . DYSLIPIDEMIA 11/29/2009  . HYPERTENSION, ESSENTIAL 11/29/2009  . URTICARIA 02/06/2010  . BPH (benign prostatic hypertrophy)   . Osteoarthritis   . DM (diabetes mellitus)   . OSA (obstructive sleep apnea) 03/26/2011    CPAP 9 cm H2O  . COPD (chronic obstructive pulmonary disease) 05/01/2011    PFT 05/01/11>>FEV1 2.07(72%), FEV1% 65, TLC 6.79(111%), DLCO 69%, no BD  . CAD (coronary artery disease)   . SVT (supraventricular tachycardia)    Past Surgical History  Procedure Laterality Date  . Appendectomy  1963  . Spine surgery      spinal fusion - L4, L5  . Total knee arthroplasty  2007  . Transurethral resection of prostate      bph  . Nasal sinus surgery  1989  . Bilateral carpal tunnel release Bilateral    Family History  Problem Relation Age of Onset  . Arthritis Other   . Diabetes Other   . Prostate cancer Brother   . Cancer Father    History  Substance Use  Topics  . Smoking status: Current Every Day Smoker -- 1.00 packs/day for 50 years    Types: Cigarettes  . Smokeless tobacco: Never Used     Comment: pt is again smoking < 1 PPD  . Alcohol Use: Yes     Comment: occasionallly    Review of Systems  Constitutional: Positive for fever and chills.  HENT:       Per HPI, otherwise negative  Respiratory:       Per HPI, otherwise negative  Cardiovascular:       Per HPI, otherwise negative  Gastrointestinal: Negative for vomiting.  Endocrine:       Negative aside from HPI  Genitourinary:       Neg aside from HPI   Musculoskeletal:       Per HPI, otherwise negative  Skin: Positive for rash.  Neurological: Negative for syncope.      Allergies  Shellfish allergy; Codeine sulfate; Morphine and related; Propoxyphene n-acetaminophen; and Ramipril  Home Medications   Prior to Admission medications   Medication Sig Start Date End Date Taking? Authorizing Provider  aspirin 81 MG tablet Take 81 mg by mouth every evening.    Yes Historical Provider, MD  Canagliflozin (INVOKANA) 300 MG TABS Take 1 tablet by mouth daily.   Yes Historical Provider, MD  CHROMIUM-CINNAMON PO Take 2,000 mg by mouth daily.   Yes Historical Provider, MD  Cyanocobalamin (VITAMIN B 12 PO) Take 1 tablet by mouth  daily.   Yes Historical Provider, MD  diphenhydrAMINE (BENADRYL) 25 MG tablet Take 50 mg by mouth every 6 (six) hours as needed for itching.   Yes Historical Provider, MD  furosemide (LASIX) 40 MG tablet Take 1 tablet (40 mg total) by mouth daily as needed. Patient taking differently: Take 40 mg by mouth 2 (two) times daily as needed for fluid.  01/02/15  Yes Lelon Perla, MD  glimepiride (AMARYL) 1 MG tablet Take 1 mg by mouth 2 (two) times daily.    Yes Historical Provider, MD  LANTUS SOLOSTAR 100 UNIT/ML Solostar Pen Inject 15 Units into the skin daily at 10 pm.  11/23/14  Yes Historical Provider, MD  metFORMIN (GLUCOPHAGE) 500 MG tablet Take 1,000 mg by  mouth 2 (two) times daily with a meal. Take 2 tablets twice a day   Yes Historical Provider, MD  pravastatin (PRAVACHOL) 40 MG tablet Take 1 tablet (40 mg total) by mouth every evening. 03/26/15  Yes Lelon Perla, MD  traMADol (ULTRAM) 50 MG tablet Take 50 mg by mouth every 6 (six) hours as needed.   Yes Historical Provider, MD  Turmeric 450 MG CAPS Take 450 mg by mouth daily.   Yes Historical Provider, MD  verapamil (CALAN-SR) 240 MG CR tablet TAKE ONE TABLET EACH DAY 04/09/15  Yes Evans Lance, MD  trimethoprim-polymyxin b (POLYTRIM) ophthalmic solution Place 2 drops into the left eye every 4 (four) hours. Patient not taking: Reported on 04/29/2015 12/14/14   [name], MD   BP 144/85 mmHg  Pulse 117  Temp(Src) 98.8 F (37.1 C) (Oral)  Resp 17  SpO2 100% Physical Exam  Constitutional: He is oriented to person, place, and time. He appears well-developed. No distress.  HENT:  Head: Normocephalic and atraumatic.  Mouth/Throat:    Eyes: Conjunctivae and EOM are normal.  Cardiovascular: Normal rate and regular rhythm.   Pulmonary/Chest: Effort normal. No stridor. No respiratory distress.  Abdominal: He exhibits no distension.  Musculoskeletal: He exhibits no edema.  Neurological: He is alert and oriented to person, place, and time.  Skin: Skin is warm and dry.  Psychiatric: He has a normal mood and affect.  Nursing note and vitals reviewed.   ED Course  Procedures (including critical care time) Labs Review Labs Reviewed  CBC - Abnormal; Notable for the following:    WBC 14.0 (*)    RBC 5.87 (*)    Hemoglobin 17.8 (*)    HCT 53.6 (*)    All other components within normal limits  BASIC METABOLIC PANEL - Abnormal; Notable for the following:    Chloride 96 (*)    Glucose, Bld 124 (*)    All other components within normal limits  I-STAT TROPOININ, ED    On repeat exam the patient states that he feels substantially better. No adverse reactions with fentanyl. Patient  states that he is intolerant of oxycodone, hydrocodone family medication, but tolerated the fentanyl well.  I discussed all findings with patient and his wife, and we discussed pros and cons of fentanyl patch for analgesia, pending dentist follow-up tomorrow given the patient's intolerance of other narcotics. Patient voices understanding of risks, benefits, will remove the patch for adverse reaction.  MDM  She presents with clinical evidence for dental infection. Patient's a potential reduction in pain here, no evidence for bacteremia or sepsis, deep space infection. 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   Carmin Muskrat, MD 04/29/15 1450

## 2015-04-29 NOTE — ED Notes (Signed)
md at bedside

## 2015-04-29 NOTE — Discharge Instructions (Signed)
As discussed, it is important to speak with your dentist tomorrow to arrange definitive care of your infection.  Return here for concerning changes in your condition.

## 2015-04-29 NOTE — ED Notes (Signed)
Pt escorted to discharge window. Pt verbalized understanding discharge instructions. In no acute distress.  

## 2015-05-03 DIAGNOSIS — E780 Pure hypercholesterolemia: Secondary | ICD-10-CM | POA: Diagnosis not present

## 2015-05-03 DIAGNOSIS — I10 Essential (primary) hypertension: Secondary | ICD-10-CM | POA: Diagnosis not present

## 2015-05-03 DIAGNOSIS — E1165 Type 2 diabetes mellitus with hyperglycemia: Secondary | ICD-10-CM | POA: Diagnosis not present

## 2015-05-03 DIAGNOSIS — R609 Edema, unspecified: Secondary | ICD-10-CM | POA: Diagnosis not present

## 2015-05-10 ENCOUNTER — Encounter: Payer: Medicare Other | Attending: Family Medicine | Admitting: *Deleted

## 2015-05-10 VITALS — Ht 68.0 in | Wt 241.0 lb

## 2015-05-10 DIAGNOSIS — Z713 Dietary counseling and surveillance: Secondary | ICD-10-CM | POA: Insufficient documentation

## 2015-05-10 DIAGNOSIS — Z794 Long term (current) use of insulin: Secondary | ICD-10-CM | POA: Diagnosis not present

## 2015-05-10 DIAGNOSIS — E119 Type 2 diabetes mellitus without complications: Secondary | ICD-10-CM | POA: Insufficient documentation

## 2015-05-10 NOTE — Progress Notes (Signed)
Medical Nutrition Therapy: 05/10/15  Appt start time:  0830    end time:  0900  Patient states his last A1c was 7.1 %   Assessment:  Primary concerns today: patient here for follow up diabetes education. He met with Lajean Saver, RD last month to learn more about vegetarianism. He has decided against moving in that direction at this time. He is pleased with weight loss of about 15 pounds since February. He states his endocrinologist has decreased his insulin to 12 units per day and decreased the Glimiperide from 2 to 1 pill per day. He states his BG are running 74-90 mg/dl and all of his lab work results are now within normal ranges. He states he has a step counter on his cell phone and he is averaging about 6000 steps a day.  TANITA  BODY COMP RESULTS on 07/21/13  Weight 255.5 lb   BMI (kg/m^2) 38.8   Fat Mass (lbs) 97.5 lb   Fat Free Mass (lbs) 158.0 lb   Total Body Water (lbs) 115.5 lb   TANITA  BODY COMP RESULTS on 10/21/13  Weight 257.5 lb   BMI (kg/m^2) 39.2 lb   Fat Mass (lbs) 97.5 lb   Fat Free Mass (lbs) 160.0 lb   Total Body Water (lbs) 117.0 lb   TANITA  BODY COMP RESULTS on 01/20/14  Weight 248.5 lb   BMI (kg/m^2) 37.8   Fat Mass (lbs) 96.0 lb   Fat Free Mass (lbs) 152.5 lb   Total Body Water (lbs) 111.5 lb   TANITA  BODY COMP RESULTS  Weight 250.0 lb   BMI (kg/m^2) 38.0   Fat Mass (lbs) 100.0 lb   Fat Free Mass (lbs) 150.0 lb   Total Body Water (lbs) 110.0 lb   TANITA  BODY COMP RESULTS  07/27/2014 Weight 249.0 lb   BMI (kg/m^2) 37.9   Fat Mass (lbs) 103.5   Fat Free Mass (lbs) 145.5   Total Body Water (lbs) 106.5 lb   TANITA  BODY COMP RESULTS 10/12/2014  Weight 251 lb   BMI (kg/m^2) 38.2   Fat Mass (lbs) 92.0 lb   Fat Free Mass (lbs) 159.0 lb   Total Body Water (lbs) 116.5 lb   TANITA  BODY COMP RESULTS 12/07/2014   Weight 258 lb   BMI (kg/m^2) 39.2   Fat Mass (lbs) 86.5 lb   Fat Free Mass (lbs) 171.5 lb   Total Body Water (lbs) 125.5 lb    TANITA   BODY COMP RESULTS 05/10/15  Weight (lbs) 241.0 lb   BMI (kg/m^2) 36.6   Fat Mass (lbs) 93.5   Fat Free Mass (lbs) 147.5   Total Body Water (lbs) 108.0    He states he weighs himself at bedtime and then in AM. He then doses his Furosamide based on whether he gains weight overnight or not.  MEDICATIONS: see list, Diabetes medications are now Invokana, Glimepiride, Metformin and Lantus insulin   Usual physical activity: walking as able about 6000 steps per day    Intervention: Nutrition counseling and diabetes education continued. Commended him on his continued progress!   Plan:  Continue to aim for 3 Carb Choices per meal (45 grams) +/- 1 either way  Continue to aim for 0-2 Carbs per snack if hungry  Continue reading food labels for Total Carbohydrate and Fat Grams of foods Continue with your activity level daily as tolerated Continue checking BG at alternate times per day on occasion Continue taking Lantus at night as directed  by your MD    Handouts given during visit include: [place] Scale results handout  Monitoring/Evaluation:  Dietary intake, exercise, reading food labels, and body weight in 3 months.

## 2015-05-11 ENCOUNTER — Encounter: Payer: Self-pay | Admitting: *Deleted

## 2015-05-11 NOTE — Patient Instructions (Signed)
Plan:  Continue to aim for 3 Carb Choices per meal (45 grams) +/- 1 either way  Continue to aim for 0-2 Carbs per snack if hungry  Continue reading food labels for Total Carbohydrate and Fat Grams of foods Continue with your activity level daily as tolerated Continue checking BG at alternate times per day on occasion Continue taking Lantus at night as directed by your MD     

## 2015-06-25 DIAGNOSIS — M25572 Pain in left ankle and joints of left foot: Secondary | ICD-10-CM | POA: Diagnosis not present

## 2015-06-25 DIAGNOSIS — M19071 Primary osteoarthritis, right ankle and foot: Secondary | ICD-10-CM | POA: Diagnosis not present

## 2015-06-29 NOTE — Progress Notes (Signed)
HPI: FU CAD and SVT. Patient states that he has had atrial fibrillation after surgeries in the past. Cardiac cath in April of 2012 revealed normal LV function, normal pulmonary pressures and pulmonary capillary wedge pressure and nonobstructive coronary disease with the most significant lesion being a 50% LAD. Carotid Dopplers in May of 2013 were normal. Echocardiogram repeated in Jan of 2015. This revealed normal LV function, grade 1 diastolic dysfunction. Nuclear study 7/15 showed EF 64 and normal perfusion. Patient also with SVT. Seen by Dr. Lovena Le and metoprolol changed to verapamil. Since last seen, there is no chest pain or dyspnea. Occasional brief palpitations but not sustained.  Current Outpatient Prescriptions  Medication Sig Dispense Refill  . amoxicillin (AMOXIL) 500 MG capsule Take 1 capsule (500 mg total) by mouth 2 (two) times daily. (Patient taking differently: Take 500 mg by mouth. FOR DENTAL USE ONLY) 10 capsule 0  . aspirin 81 MG tablet Take 81 mg by mouth every evening.     . Canagliflozin (INVOKANA) 300 MG TABS Take 1 tablet by mouth daily.    . CHROMIUM-CINNAMON PO Take 2,000 mg by mouth daily.    . Cyanocobalamin (VITAMIN B 12 PO) Take 1 tablet by mouth daily.    . furosemide (LASIX) 40 MG tablet Take 1 tablet (40 mg total) by mouth daily as needed. (Patient taking differently: Take 40 mg by mouth 2 (two) times daily as needed for fluid. ) 30 tablet   . glimepiride (AMARYL) 1 MG tablet Take 1 mg by mouth 2 (two) times daily.     Marland Kitchen LANTUS SOLOSTAR 100 UNIT/ML Solostar Pen Inject 15 Units into the skin daily at 10 pm.   1  . metFORMIN (GLUCOPHAGE) 500 MG tablet Take 1,000 mg by mouth 2 (two) times daily with a meal. Take 2 tablets twice a day    . pravastatin (PRAVACHOL) 40 MG tablet Take 1 tablet (40 mg total) by mouth every evening. 90 tablet 3  . Turmeric 450 MG CAPS Take 450 mg by mouth daily.    . verapamil (CALAN-SR) 240 MG CR tablet TAKE ONE TABLET EACH DAY 90  tablet 1   No current facility-administered medications for this visit.     Past Medical History  Diagnosis Date  . DYSLIPIDEMIA 11/29/2009  . HYPERTENSION, ESSENTIAL 11/29/2009  . URTICARIA 02/06/2010  . BPH (benign prostatic hypertrophy)   . Osteoarthritis   . DM (diabetes mellitus)   . OSA (obstructive sleep apnea) 03/26/2011    CPAP 9 cm H2O  . COPD (chronic obstructive pulmonary disease) 05/01/2011    PFT 05/01/11>>FEV1 2.07(72%), FEV1% 65, TLC 6.79(111%), DLCO 69%, no BD  . CAD (coronary artery disease)   . SVT (supraventricular tachycardia)     Past Surgical History  Procedure Laterality Date  . Appendectomy  1963  . Spine surgery      spinal fusion - L4, L5  . Total knee arthroplasty  2007  . Transurethral resection of prostate      bph  . Nasal sinus surgery  1989  . Bilateral carpal tunnel release Bilateral     Social History   Social History  . Marital Status: Married    Spouse Name: N/A  . Number of Children: 3  . Years of Education: N/A   Occupational History  . Retired Other   Social History Main Topics  . Smoking status: Current Every Day Smoker -- 1.00 packs/day for 50 years    Types: Cigarettes  . Smokeless  tobacco: Never Used     Comment: pt is again smoking < 1 PPD  . Alcohol Use: Yes     Comment: occasionallly  . Drug Use: No  . Sexual Activity: Not on file   Other Topics Concern  . Not on file   Social History Narrative    ROS: no fevers or chills, productive cough, hemoptysis, dysphasia, odynophagia, melena, hematochezia, dysuria, hematuria, rash, seizure activity, orthopnea, PND, pedal edema, claudication. Remaining systems are negative.  Physical Exam: Well-developed well-nourished in no acute distress.  Skin is warm and dry.  HEENT is normal.  Neck is supple.  Chest is clear to auscultation with normal expansion.  Cardiovascular exam is regular rate and rhythm.  Abdominal exam nontender or distended. No masses palpated.  Positive bruit Extremities show no edema. neuro grossly intact  ECG sinus rhythm at a rate of 91. No ST changes.

## 2015-07-03 ENCOUNTER — Ambulatory Visit: Payer: Medicare Other | Admitting: Cardiology

## 2015-07-05 ENCOUNTER — Ambulatory Visit (INDEPENDENT_AMBULATORY_CARE_PROVIDER_SITE_OTHER): Payer: Medicare Other | Admitting: Cardiology

## 2015-07-05 ENCOUNTER — Encounter: Payer: Self-pay | Admitting: Cardiology

## 2015-07-05 ENCOUNTER — Encounter: Payer: Self-pay | Admitting: *Deleted

## 2015-07-05 VITALS — BP 106/60 | HR 91 | Ht 68.0 in | Wt 241.0 lb

## 2015-07-05 DIAGNOSIS — I2510 Atherosclerotic heart disease of native coronary artery without angina pectoris: Secondary | ICD-10-CM | POA: Diagnosis not present

## 2015-07-05 DIAGNOSIS — Z720 Tobacco use: Secondary | ICD-10-CM

## 2015-07-05 DIAGNOSIS — R0989 Other specified symptoms and signs involving the circulatory and respiratory systems: Secondary | ICD-10-CM

## 2015-07-05 DIAGNOSIS — I471 Supraventricular tachycardia: Secondary | ICD-10-CM

## 2015-07-05 DIAGNOSIS — I10 Essential (primary) hypertension: Secondary | ICD-10-CM

## 2015-07-05 DIAGNOSIS — R609 Edema, unspecified: Secondary | ICD-10-CM

## 2015-07-05 DIAGNOSIS — E785 Hyperlipidemia, unspecified: Secondary | ICD-10-CM

## 2015-07-05 NOTE — Assessment & Plan Note (Signed)
Continue statin. 

## 2015-07-05 NOTE — Assessment & Plan Note (Signed)
Abdominal ultrasound to exclude aneurysm. 

## 2015-07-05 NOTE — Assessment & Plan Note (Signed)
Continue aspirin and statin. 

## 2015-07-05 NOTE — Patient Instructions (Signed)
Your physician wants you to follow-up in: ONE YEAR WITH DR CRENSHAW You will receive a reminder letter in the mail two months in advance. If you don't receive a letter, please call our office to schedule the follow-up appointment.   Your physician has requested that you have an abdominal aorta duplex. During this test, an ultrasound is used to evaluate the aorta. Allow 30 minutes for this exam. Do not eat after midnight the day before and avoid carbonated beverages  

## 2015-07-05 NOTE — Assessment & Plan Note (Signed)
No prolonged episodes. Continue verapamil. We can consider referral back for ablation if he has worsening symptoms in the future.

## 2015-07-05 NOTE — Assessment & Plan Note (Signed)
Patient counseled on discontinuing. 

## 2015-07-05 NOTE — Assessment & Plan Note (Signed)
Blood pressure controlled. Continue present medications. 

## 2015-07-05 NOTE — Assessment & Plan Note (Signed)
Continue Lasix as needed.

## 2015-07-20 ENCOUNTER — Inpatient Hospital Stay (HOSPITAL_COMMUNITY): Admission: RE | Admit: 2015-07-20 | Payer: Medicare Other | Source: Ambulatory Visit

## 2015-07-30 ENCOUNTER — Telehealth: Payer: Self-pay | Admitting: Cardiology

## 2015-07-30 NOTE — Telephone Encounter (Signed)
Continue calan and call if further episodes of svt Kirk Ruths

## 2015-07-30 NOTE — Telephone Encounter (Signed)
Steve Charles is calling because his heart is beating fast , said it feels like its flipping over.. Please  Thanks

## 2015-07-30 NOTE — Telephone Encounter (Signed)
Returned call

## 2015-07-30 NOTE — Telephone Encounter (Signed)
Returned call to patient.He stated earlier this morning he had fast and irregular heart beat with rate 160 bpm.Stated at present pulse 90 B/P 96/61.Stated he has not had this irregular heart beat in a long time.He takes calan 240 mg daily.Advised Dr.Crenshaw out of office.I will send message to him for advice.Advised to call sooner if needed.

## 2015-07-31 NOTE — Telephone Encounter (Signed)
Mr. Kaluzny is calling back because he is having the same problems he was having on yesterday .

## 2015-07-31 NOTE — Telephone Encounter (Signed)
Called patient  Told him to come in for an EKG.  He will come in [facility] in the morning for nurse visit EKG  And BP check follow up with OV if HR elevated per Dr. Stanford Breed  If he gets any feeling poorly  Or worse through the night advised to go to the ED  Will route to [facility] admin pool to schedule appointment with Dr. Lovena Le

## 2015-07-31 NOTE — Telephone Encounter (Signed)
Check ecg if hr elevated, fuov Kirk Ruths

## 2015-07-31 NOTE — Telephone Encounter (Signed)
He has seen EP (Dr. Lovena Le) for this. Would recommend he try to get back to see him for this. BP has been chronically low, there may not be room to increase the verapamil.  Dr. Lemmie Evens

## 2015-07-31 NOTE — Telephone Encounter (Signed)
Returned patient call  C/o same issues this morning with HR irregullar rate up to 120  BP 112/71  Feels tired out  No dizziness, weakness, chest pain or shortness of breath.  No fever or chills  Will route to Dr. Stanford Breed and Dr. Debara Pickett DOD

## 2015-07-31 NOTE — Telephone Encounter (Signed)
Pt called back this AM. Advice from Dr. Stanford Breed communicated to patient.  He is still having episodes today - HR up to 120 this morning. These episodes come and go.  He denies SOB, fatigue, lightheadedness, or other symptoms w/ this. Only symptom is sensation of heart "flipping over". Notes BP OK - 90/60 which is WNR for patient.

## 2015-08-01 ENCOUNTER — Ambulatory Visit (INDEPENDENT_AMBULATORY_CARE_PROVIDER_SITE_OTHER): Payer: Medicare Other | Admitting: *Deleted

## 2015-08-01 VITALS — BP 104/76 | HR 101

## 2015-08-01 DIAGNOSIS — R000 Tachycardia, unspecified: Secondary | ICD-10-CM | POA: Diagnosis not present

## 2015-08-01 DIAGNOSIS — Z23 Encounter for immunization: Secondary | ICD-10-CM

## 2015-08-01 NOTE — Patient Instructions (Signed)
Follow up with Dr. Lovena Le as discussed - we will call you to set up an appointment.

## 2015-08-01 NOTE — Progress Notes (Signed)
Patient presented for nurse visit - ECG and BP check

## 2015-08-05 ENCOUNTER — Encounter: Payer: Self-pay | Admitting: Cardiology

## 2015-08-09 ENCOUNTER — Ambulatory Visit (HOSPITAL_COMMUNITY)
Admission: RE | Admit: 2015-08-09 | Discharge: 2015-08-09 | Disposition: A | Discharge status: Home / Self Care | Payer: Medicare Other | Source: Ambulatory Visit | Attending: Internal Medicine | Admitting: Internal Medicine

## 2015-08-09 ENCOUNTER — Other Ambulatory Visit: Payer: Self-pay | Admitting: Cardiology

## 2015-08-09 DIAGNOSIS — E785 Hyperlipidemia, unspecified: Secondary | ICD-10-CM | POA: Diagnosis not present

## 2015-08-09 DIAGNOSIS — I700 Atherosclerosis of aorta: Secondary | ICD-10-CM | POA: Diagnosis not present

## 2015-08-09 DIAGNOSIS — E119 Type 2 diabetes mellitus without complications: Secondary | ICD-10-CM | POA: Insufficient documentation

## 2015-08-09 DIAGNOSIS — I2510 Atherosclerotic heart disease of native coronary artery without angina pectoris: Secondary | ICD-10-CM | POA: Insufficient documentation

## 2015-08-09 DIAGNOSIS — R0989 Other specified symptoms and signs involving the circulatory and respiratory systems: Secondary | ICD-10-CM | POA: Diagnosis not present

## 2015-08-09 DIAGNOSIS — I10 Essential (primary) hypertension: Secondary | ICD-10-CM | POA: Diagnosis not present

## 2015-08-09 DIAGNOSIS — F17200 Nicotine dependence, unspecified, uncomplicated: Secondary | ICD-10-CM | POA: Insufficient documentation

## 2015-08-10 NOTE — Progress Notes (Signed)
HPI: FU CAD and SVT. Patient states that he has had atrial fibrillation after surgeries in the past. Cardiac cath in April of 2012 revealed normal LV function, normal pulmonary pressures and pulmonary capillary wedge pressure and nonobstructive coronary disease with the most significant lesion being a 50% LAD. Carotid Dopplers in May of 2013 were normal. Echocardiogram repeated in Jan of 2015. This revealed normal LV function, grade 1 diastolic dysfunction. Nuclear study 7/15 showed EF 64 and normal perfusion. Abdominal ultrasound October 2016 showed no aneurysm. Patient also with SVT. Seen by Dr. Lovena Le and metoprolol changed to verapamil. Patient contacted the office recently with tachycardia with heart rate of 120 and palpitations. Electrocardiogram showed sinus rhythm with no ST changes. Since last seen, The patient has had problems with recurrent SVT by his report. These are sudden in onset and had previously been terminated with Valsalva. He also has increased palpitations otherwise. No syncope. No chest pain or dyspnea.  Current Outpatient Prescriptions  Medication Sig Dispense Refill  . aspirin 81 MG tablet Take 81 mg by mouth every evening.     . Canagliflozin (INVOKANA) 300 MG TABS Take 1 tablet by mouth daily.    . CHROMIUM-CINNAMON PO Take 2,000 mg by mouth daily.    . Cyanocobalamin (VITAMIN B 12 PO) Take 1 tablet by mouth daily.    . furosemide (LASIX) 40 MG tablet Take 1 tablet (40 mg total) by mouth daily as needed. (Patient taking differently: Take 40 mg by mouth 2 (two) times daily as needed for fluid. ) 30 tablet   . LANTUS SOLOSTAR 100 UNIT/ML Solostar Pen Inject 15 Units into the skin daily at 10 pm.   1  . metFORMIN (GLUCOPHAGE) 500 MG tablet Take 1,000 mg by mouth 2 (two) times daily with a meal. Take 2 tablets twice a day    . pravastatin (PRAVACHOL) 40 MG tablet Take 1 tablet (40 mg total) by mouth every evening. 90 tablet 3  . Turmeric 450 MG CAPS Take 450 mg by mouth  daily.    . verapamil (CALAN-SR) 240 MG CR tablet TAKE ONE TABLET EACH DAY 90 tablet 1   No current facility-administered medications for this visit.     Past Medical History  Diagnosis Date  . DYSLIPIDEMIA 11/29/2009  . HYPERTENSION, ESSENTIAL 11/29/2009  . URTICARIA 02/06/2010  . BPH (benign prostatic hypertrophy)   . Osteoarthritis   . DM (diabetes mellitus) ([place])   . OSA (obstructive sleep apnea) 03/26/2011    CPAP 9 cm H2O  . COPD (chronic obstructive pulmonary disease) ([place]) 05/01/2011    PFT 05/01/11>>FEV1 2.07(72%), FEV1% 65, TLC 6.79(111%), DLCO 69%, no BD  . CAD (coronary artery disease)   . SVT (supraventricular tachycardia) [facility])     Past Surgical History  Procedure Laterality Date  . Appendectomy  1963  . Spine surgery      spinal fusion - L4, L5  . Total knee arthroplasty  2007  . Transurethral resection of prostate      bph  . Nasal sinus surgery  1989  . Bilateral carpal tunnel release Bilateral     Social History   Social History  . Marital Status: Married    Spouse Name: N/A  . Number of Children: 3  . Years of Education: N/A   Occupational History  . Retired Other   Social History Main Topics  . Smoking status: Current Every Day Smoker -- 1.00 packs/day for 50 years    Types: Cigarettes  .  Smokeless tobacco: Never Used     Comment: pt is again smoking < 1 PPD  . Alcohol Use: Yes     Comment: occasionallly  . Drug Use: No  . Sexual Activity: Not on file   Other Topics Concern  . Not on file   Social History Narrative    ROS: no fevers or chills, productive cough, hemoptysis, dysphasia, odynophagia, melena, hematochezia, dysuria, hematuria, rash, seizure activity, orthopnea, PND, pedal edema, claudication. Remaining systems are negative.  Physical Exam: Well-developed well-nourished in no acute distress.  Skin is warm and dry.  HEENT is normal.  Neck is supple.  Chest is clear to auscultation with normal expansion.  Cardiovascular  exam is regular rate and rhythm.  Abdominal exam nontender or distended. No masses palpated. Extremities show trace edema. neuro grossly intact  ECG 07/05/2015-sinus rhythm with no ST changes. 08/01/2015-sinus tachycardia with no ST changes.

## 2015-08-13 DIAGNOSIS — E1165 Type 2 diabetes mellitus with hyperglycemia: Secondary | ICD-10-CM | POA: Diagnosis not present

## 2015-08-13 DIAGNOSIS — I10 Essential (primary) hypertension: Secondary | ICD-10-CM | POA: Diagnosis not present

## 2015-08-13 DIAGNOSIS — R609 Edema, unspecified: Secondary | ICD-10-CM | POA: Diagnosis not present

## 2015-08-13 DIAGNOSIS — E7800 Pure hypercholesterolemia, unspecified: Secondary | ICD-10-CM | POA: Diagnosis not present

## 2015-08-13 DIAGNOSIS — R000 Tachycardia, unspecified: Secondary | ICD-10-CM | POA: Diagnosis not present

## 2015-08-14 ENCOUNTER — Ambulatory Visit (INDEPENDENT_AMBULATORY_CARE_PROVIDER_SITE_OTHER): Payer: Medicare Other | Admitting: Cardiology

## 2015-08-14 ENCOUNTER — Encounter: Payer: Self-pay | Admitting: Cardiology

## 2015-08-14 ENCOUNTER — Other Ambulatory Visit: Payer: Self-pay | Admitting: *Deleted

## 2015-08-14 VITALS — BP 98/62 | HR 78 | Ht 68.0 in | Wt 249.0 lb

## 2015-08-14 DIAGNOSIS — Z720 Tobacco use: Secondary | ICD-10-CM | POA: Diagnosis not present

## 2015-08-14 DIAGNOSIS — I2510 Atherosclerotic heart disease of native coronary artery without angina pectoris: Secondary | ICD-10-CM | POA: Diagnosis not present

## 2015-08-14 DIAGNOSIS — I471 Supraventricular tachycardia: Secondary | ICD-10-CM | POA: Diagnosis not present

## 2015-08-14 NOTE — Assessment & Plan Note (Signed)
Blood pressure controlled. Continue present medications. 

## 2015-08-14 NOTE — Assessment & Plan Note (Signed)
Continue present dose of Lasix. He had laboratories drawn recently by his endocrinologist. We will have those forwarded to [place].

## 2015-08-14 NOTE — Patient Instructions (Signed)
Your physician recommends that you schedule a follow-up appointment WITH DR Brady SVT  Your physician recommends that you schedule a follow-up appointment in: [place]

## 2015-08-14 NOTE — Assessment & Plan Note (Signed)
Patient counseled on discontinuing. 

## 2015-08-14 NOTE — Assessment & Plan Note (Signed)
Continue aspirin and statin. 

## 2015-08-14 NOTE — Assessment & Plan Note (Signed)
Patient is having recurrent SVT by his report. He is also having palpitations at other times. It is not clear that all of his palpitations are related to SVT but he would like to see Dr. Lovena Le for consideration of ablation. We will arrange. I cannot advance his verapamil as his blood pressure is borderline.

## 2015-08-14 NOTE — Assessment & Plan Note (Signed)
As continue statin.

## 2015-08-21 NOTE — Addendum Note (Signed)
Addended by: Diana Eves on: 08/21/2015 12:59 PM   Modules accepted: Orders

## 2015-08-22 ENCOUNTER — Encounter: Payer: Self-pay | Admitting: *Deleted

## 2015-08-22 ENCOUNTER — Telehealth: Payer: Self-pay | Admitting: *Deleted

## 2015-08-22 NOTE — Telephone Encounter (Signed)
Spoke with pt, he has been out of work for two weeks due to his heart. He needs a note to return to work. His appt with dr taylor is 10-02-15 Will forward for dr crenshaw's review

## 2015-08-22 NOTE — Telephone Encounter (Signed)
Ok to return to work    [employer] with pt, he will come by the office tomorrow to pick the note up.

## 2015-08-23 ENCOUNTER — Encounter: Payer: Self-pay | Admitting: Cardiology

## 2015-08-30 ENCOUNTER — Encounter: Payer: Medicare Other | Attending: Family Medicine | Admitting: *Deleted

## 2015-08-30 ENCOUNTER — Encounter: Payer: Self-pay | Admitting: *Deleted

## 2015-08-30 VITALS — Ht 68.0 in | Wt 235.0 lb

## 2015-08-30 DIAGNOSIS — E119 Type 2 diabetes mellitus without complications: Secondary | ICD-10-CM | POA: Diagnosis not present

## 2015-08-30 DIAGNOSIS — Z713 Dietary counseling and surveillance: Secondary | ICD-10-CM | POA: Insufficient documentation

## 2015-08-30 NOTE — Progress Notes (Signed)
Medical Nutrition Therapy: 08/30/15  Appt start time:  0830    end time:  0900  Patient states his last A1c was 7.1 %   Assessment:  Primary concerns today: patient here for follow up diabetes education. He is pleased with continued weight loss of another 6 pounds since July. He states his endocrinologist has decreased his diabetes medications and discontinued the Glimiperide completely. He states his BG are running 74-90 mg/dl. He has a new Diabetes App on his phone that gives him his BG averages and activity ranges. His 90 day BG average is currently at 99 mg/dl! States he is considering cutting back on his hours working at the 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  TANITA  BODY COMP RESULTS on 07/21/13  Weight 255.5 lb   BMI (kg/m^2) 38.8   Fat Mass (lbs) 97.5 lb   Fat Free Mass (lbs) 158.0 lb   Total Body Water (lbs) 115.5 lb   TANITA  BODY COMP RESULTS on 10/21/13  Weight 257.5 lb   BMI (kg/m^2) 39.2 lb   Fat Mass (lbs) 97.5 lb   Fat Free Mass (lbs) 160.0 lb   Total Body Water (lbs) 117.0 lb   TANITA  BODY COMP RESULTS on 01/20/14  Weight 248.5 lb   BMI (kg/m^2) 37.8   Fat Mass (lbs) 96.0 lb   Fat Free Mass (lbs) 152.5 lb   Total Body Water (lbs) 111.5 lb   TANITA  BODY COMP RESULTS  Weight 250.0 lb   BMI (kg/m^2) 38.0   Fat Mass (lbs) 100.0 lb   Fat Free Mass (lbs) 150.0 lb   Total Body Water (lbs) 110.0 lb   TANITA  BODY COMP RESULTS  07/27/2014 Weight 249.0 lb   BMI (kg/m^2) 37.9   Fat Mass (lbs) 103.5   Fat Free Mass (lbs) 145.5   Total Body Water (lbs) 106.5 lb   TANITA  BODY COMP RESULTS 10/12/2014  Weight 251 lb   BMI (kg/m^2) 38.2   Fat Mass (lbs) 92.0 lb   Fat Free Mass (lbs) 159.0 lb   Total Body Water (lbs) 116.5 lb   TANITA  BODY COMP RESULTS 12/07/2014   Weight 258 lb   BMI (kg/m^2) 39.2   Fat Mass (lbs) 86.5 lb   Fat Free Mass (lbs) 171.5 lb   Total Body Water  (lbs) 125.5 lb    TANITA  BODY COMP RESULTS 05/10/15  Weight (lbs) 241.0 lb   BMI (kg/m^2) 36.6   Fat Mass (lbs) 93.5   Fat Free Mass (lbs) 147.5   Total Body Water (lbs) 108.0   TANITA  BODY COMP RESULTS 08/30/15  Weight Lbs) 235.0 lb   BMI (kg/m^2) 35.7   Fat Mass (lbs) 84.5   Fat Free Mass (lbs) 150.5   Total Body Water (lbs) 110.0   Weight is down 6 pounds, fat mass down 9.5 pounds! Fat free mass is up 2.5 pounds but 2 of those are water weight today.  MEDICATIONS: see list, Diabetes medications are now Invokana, Metformin and Lantus insulin   Usual physical activity: walking as able about 6000 steps per day    Intervention: Nutrition counseling and diabetes education continued. Commended him on his continued progress!   Plan:  Continue to aim for 3 Carb Choices per meal (45 grams) +/- 1 either way  Continue to aim for 0-2 Carbs per snack if hungry  Continue reading  food labels for Total Carbohydrate and Fat Grams of foods Continue with your activity level daily as tolerated Continue checking BG at alternate times per day on occasion Continue taking Lantus at night as directed by your MD    Handouts given during visit include: Tanita Scale results handout  Monitoring/Evaluation:  Dietary intake, exercise, reading food labels, and body weight in 3 months.

## 2015-09-10 ENCOUNTER — Encounter: Payer: Self-pay | Admitting: Cardiology

## 2015-10-02 ENCOUNTER — Encounter: Payer: Self-pay | Admitting: Internal Medicine

## 2015-10-02 ENCOUNTER — Ambulatory Visit (INDEPENDENT_AMBULATORY_CARE_PROVIDER_SITE_OTHER): Payer: Medicare Other | Admitting: Internal Medicine

## 2015-10-02 VITALS — BP 122/80 | HR 105 | Ht 68.0 in | Wt 237.0 lb

## 2015-10-02 DIAGNOSIS — I10 Essential (primary) hypertension: Secondary | ICD-10-CM | POA: Diagnosis not present

## 2015-10-02 DIAGNOSIS — I471 Supraventricular tachycardia: Secondary | ICD-10-CM

## 2015-10-02 DIAGNOSIS — I2510 Atherosclerotic heart disease of native coronary artery without angina pectoris: Secondary | ICD-10-CM

## 2015-10-02 DIAGNOSIS — R635 Abnormal weight gain: Secondary | ICD-10-CM

## 2015-10-02 DIAGNOSIS — I2583 Coronary atherosclerosis due to lipid rich plaque: Secondary | ICD-10-CM

## 2015-10-02 NOTE — Patient Instructions (Signed)
Medication Instructions:  Your physician recommends that you continue on your current medications as directed. Please refer to the Current Medication list given to you today.   Labwork: None ordered   Testing/Procedures: Your physician has recommended that you wear a holter monitor. Holter monitors are medical devices that record the heart's electrical activity. Doctors most often use these monitors to diagnose arrhythmias. Arrhythmias are problems with the speed or rhythm of the heartbeat. The monitor is a small, portable device. You can wear one while you do your normal daily activities. This is usually used to diagnose what is causing palpitations/syncope (passing out).    Follow-Up: Will call you with results of your holter and let you know the plan  Any Other Special Instructions Will Be Listed Below (If Applicable).     If you need a refill on your cardiac medications before your next appointment, please call your pharmacy.

## 2015-10-02 NOTE — Assessment & Plan Note (Signed)
He is obese and needs to lose weight and will be encouraged to do so.

## 2015-10-02 NOTE — Assessment & Plan Note (Signed)
His symptoms have changed a bit from being episodic to feeling like his heart is racing all of the time. I do not know what the mechanism of his SVT is, although previously I thought he had AVNRT. He could have atrial tachy, flutter or even fibrillation. I have asked him to undergo a 48 hour holter monitor.

## 2015-10-02 NOTE — Assessment & Plan Note (Signed)
He currently denies anginal symptoms but admits to being sedentary.

## 2015-10-02 NOTE — Assessment & Plan Note (Signed)
His blood pressure is well controlled on medical therapy. Will follow.  

## 2015-10-02 NOTE — Progress Notes (Signed)
HPI Mr. Belmont returns today for followup. He is a pleasant 69 yo man with palpitations, SVT, HTN, DM and obesity. He has been treated with escalating doses of metoprolol and has for the most part control of his arrhythmias. His blood pressure has been controlled. He has had SVT for years. Previously his SVT could be controlled with vagal maneuvers. Because of frequent stools, we placed him on verapamil and his arrhythmias and his frequent stools have been well controlled. He has had palpitations which worsened over the past few months and has been limited by blood pressure. Real question is the mechanism of his arrhythmias. He cannot tell if his heart is beating regularly or irregularly.  Allergies  Allergen Reactions  . Shellfish Allergy Hives  . Codeine Sulfate Hives  . Morphine And Related Hives    Oral morphine/ hives   . Propoxyphene N-Acetaminophen Hives    REACTION: hives  . Ramipril Hives     Current Outpatient Prescriptions  Medication Sig Dispense Refill  . aspirin 81 MG tablet Take 81 mg by mouth every evening.     . Canagliflozin (INVOKANA) 300 MG TABS Take 1 tablet by mouth daily.    . CHROMIUM-CINNAMON PO Take 2,000 mg by mouth daily.    . Cyanocobalamin (VITAMIN B 12 PO) Take 1 tablet by mouth daily.    . furosemide (LASIX) 40 MG tablet Take 40 mg by mouth daily as needed (swelling).    Marland Kitchen LANTUS SOLOSTAR 100 UNIT/ML Solostar Pen Inject 10 Units into the skin daily at 10 pm.   1  . metFORMIN (GLUCOPHAGE) 500 MG tablet Take 1,000 mg by mouth 2 (two) times daily with a meal. Take 2 tablets twice a day    . pravastatin (PRAVACHOL) 40 MG tablet Take 1 tablet (40 mg total) by mouth every evening. 90 tablet 3  . Turmeric 450 MG CAPS Take 450 mg by mouth daily.    . verapamil (CALAN-SR) 240 MG CR tablet Take 240 mg by mouth daily.     No current facility-administered medications for this visit.     Past Medical History  Diagnosis Date  . DYSLIPIDEMIA 11/29/2009    . HYPERTENSION, ESSENTIAL 11/29/2009  . URTICARIA 02/06/2010  . BPH (benign prostatic hypertrophy)   . Osteoarthritis   . DM (diabetes mellitus) ([place])   . OSA (obstructive sleep apnea) 03/26/2011    CPAP 9 cm H2O  . COPD (chronic obstructive pulmonary disease) ([place]) 05/01/2011    PFT 05/01/11>>FEV1 2.07(72%), FEV1% 65, TLC 6.79(111%), DLCO 69%, no BD  . CAD (coronary artery disease)   . SVT (supraventricular tachycardia) ([facility])     ROS:   All systems reviewed and negative except as noted in the HPI.   Past Surgical History  Procedure Laterality Date  . Appendectomy  1963  . Spine surgery      spinal fusion - L4, L5  . Total knee arthroplasty  2007  . Transurethral resection of prostate      bph  . Nasal sinus surgery  1989  . Bilateral carpal tunnel release Bilateral      Family History  Problem Relation Age of Onset  . Arthritis Other   . Diabetes Other   . Prostate cancer Brother   . Cancer Father      Social History   Social History  . Marital Status: Married    Spouse Name: N/A  . Number of Children: 3  . Years of Education: N/A  Occupational History  . Retired Other   Social History Main Topics  . Smoking status: Current Every Day Smoker -- 1.00 packs/day for 50 years    Types: Cigarettes  . Smokeless tobacco: Never Used     Comment: pt is again smoking < 1 PPD  . Alcohol Use: Yes     Comment: occasionallly  . Drug Use: No  . Sexual Activity: Not on file   Other Topics Concern  . Not on file   Social History Narrative     BP 122/80 mmHg  Pulse 105  Ht 5\' 8"  (1.727 m)  Wt 237 lb (107.502 kg)  BMI 36.04 kg/m2  Physical Exam:  Well appearing obese 69 yo man, in NAD HEENT: Unremarkable Neck:  6 cm JVD, no thyromegally Back:  No CVA tenderness Lungs:  Clear with no wheezes HEART:  Regular rate rhythm, no murmurs, no rubs, no clicks Abd:  soft, obese, positive bowel sounds, no organomegally, no rebound, no guarding Ext:  2 plus pulses,  no edema, no cyanosis, no clubbing Skin:  No rashes no nodules Neuro:  CN II through XII intact, motor grossly intact  EKG - not done today   Assess/Plan:

## 2015-10-03 ENCOUNTER — Ambulatory Visit (INDEPENDENT_AMBULATORY_CARE_PROVIDER_SITE_OTHER): Payer: Medicare Other

## 2015-10-03 DIAGNOSIS — I471 Supraventricular tachycardia: Secondary | ICD-10-CM

## 2015-10-10 ENCOUNTER — Ambulatory Visit (INDEPENDENT_AMBULATORY_CARE_PROVIDER_SITE_OTHER): Payer: Medicare Other | Admitting: Family Medicine

## 2015-10-10 ENCOUNTER — Encounter: Payer: Self-pay | Admitting: Family Medicine

## 2015-10-10 ENCOUNTER — Telehealth: Payer: Self-pay | Admitting: Family Medicine

## 2015-10-10 VITALS — BP 110/70 | HR 107 | Temp 98.9°F | Resp 16 | Ht 68.0 in | Wt 239.8 lb

## 2015-10-10 DIAGNOSIS — M778 Other enthesopathies, not elsewhere classified: Secondary | ICD-10-CM | POA: Diagnosis not present

## 2015-10-10 DIAGNOSIS — I2510 Atherosclerotic heart disease of native coronary artery without angina pectoris: Secondary | ICD-10-CM | POA: Diagnosis not present

## 2015-10-10 NOTE — Telephone Encounter (Signed)
Okay to work him in?

## 2015-10-10 NOTE — Progress Notes (Signed)
Pre visit review using our clinic review tool, if applicable. No additional management support is needed unless otherwise documented below in the visit note. 

## 2015-10-10 NOTE — Telephone Encounter (Signed)
yes

## 2015-10-10 NOTE — Telephone Encounter (Signed)
Pt has been sch

## 2015-10-10 NOTE — Progress Notes (Signed)
   Subjective:    Patient ID: Steve Charles, male    DOB: 11/15/1945, 69 y.o.   MRN: [id number]  HPI Acute visit for left wrist pain. No recent injury. Onset earlier today. Woke up with severe pain. He has pain mostly along the extensor and abductor tendons of the thumb. He has known osteoarthritis of the left thumb-and has seen orthopedist in the past for this. No weakness.  Past Medical History  Diagnosis Date  . DYSLIPIDEMIA 11/29/2009  . HYPERTENSION, ESSENTIAL 11/29/2009  . URTICARIA 02/06/2010  . BPH (benign prostatic hypertrophy)   . Osteoarthritis   . DM (diabetes mellitus) ([place])   . OSA (obstructive sleep apnea) 03/26/2011    CPAP 9 cm H2O  . COPD (chronic obstructive pulmonary disease) ([place]) 05/01/2011    PFT 05/01/11>>FEV1 2.07(72%), FEV1% 65, TLC 6.79(111%), DLCO 69%, no BD  . CAD (coronary artery disease)   . SVT (supraventricular tachycardia) [facility])    Past Surgical History  Procedure Laterality Date  . Appendectomy  1963  . Spine surgery      spinal fusion - L4, L5  . Total knee arthroplasty  2007  . Transurethral resection of prostate      bph  . Nasal sinus surgery  1989  . Bilateral carpal tunnel release Bilateral     reports that he has been smoking Cigarettes.  He has a 50 pack-year smoking history. He has never used smokeless tobacco. He reports that he drinks alcohol. He reports that he does not use illicit drugs. family history includes Arthritis in his other; Cancer in his father; Diabetes in his other; Prostate cancer in his brother. Allergies  Allergen Reactions  . Shellfish Allergy Hives  . Codeine Sulfate Hives  . Morphine And Related Hives    Oral morphine/ hives   . Propoxyphene N-Acetaminophen Hives    REACTION: hives  . Ramipril Hives      Review of Systems  Neurological: Negative for weakness and numbness.       Objective:   Physical Exam  Constitutional: He appears well-developed and well-nourished.  Cardiovascular: Normal rate  and regular rhythm.   Pulmonary/Chest: Effort normal. No respiratory distress. He has no wheezes. He has no rales.  Musculoskeletal:  Left thumb reveals some tenderness at the carpometacarpal joint and also the extensor tendon of the thumb. No erythema or warmth. No wrist effusion. No radial tenderness          Assessment & Plan:  Probable de Quervain's tenosynovitis left wrist. Try icing. Discussed possible steroid injection but patient is reluctant because of his diabetes history. Touch base if not improving with icing

## 2015-10-10 NOTE — Telephone Encounter (Signed)
Pt is having back pain and rib pain and pain in other areas. Can I open up a slot today?

## 2015-10-10 NOTE — Patient Instructions (Signed)
De Quervain Disease De Quervain disease is inflammation of the tendon on the thumb side of the wrist. Tendons are cords of tissue that connect bones to muscles. The tendons in your hand pass through a tunnel, or sheath. A slippery layer of tissue (synovium) lets the tendons move smoothly in the sheath. With de Quervain disease, the sheath swells or thickens, causing friction and pain. The condition is also called de Quervain tendinosis and de Quervain syndrome. It occurs most often in women who are 30-50 years old. CAUSES  The exact cause of de Quervain disease is not known. It may result from:   Overusing your hands, especially with repetitive motions that involve twisting your hand or using a forceful grip.  Pregnancy.  Rheumatoid disease. RISK FACTORS You may have a greater risk for de Quervain disease if you:  Are a middle-aged woman.  Are pregnant.  Have rheumatoid arthritis.  Have diabetes.  Use your hands far more than normal, especially with a tight grip or excessive twisting. SIGNS AND SYMPTOMS Pain on the thumb side of your wrist is the main symptom of de Quervain disease. Other signs and symptoms include:  Pain that gets worse when you grasp something or turn your wrist.  Pain that extends up the forearm.  Cysts in the area of the pain.  Swelling of your wrist and hand.  A sensation of snapping in the wrist.  Trouble moving the thumb and wrist. DIAGNOSIS  Your health care provider may diagnose de Quervain disease based on your signs and symptoms. A physical exam will also be done. A simple test (Finkelstein test) that involves pulling your thumb and wrist to see if this causes pain can help determine whether you have the condition. Sometimes you may need to have an X-ray.  TREATMENT  Avoiding any activity that causes pain and swelling is the best treatment. Other options include:  Wearing a splint.  Taking medicine. Anti-inflammatory medicines and corticosteroid  injections may reduce inflammation and relieve pain.  Having surgery if other treatments do not work. HOME CARE INSTRUCTIONS   Using ice can be helpful after doing activities that involve the sore wrist. To apply ice to the injured area:  Put ice in a plastic bag.  Place a towel between your skin and the bag.  Leave the ice on for 20 minutes, 2-3 times a day.  Take medicines only as directed by your health care provider.  Wear your splint as directed. This will allow your hand to rest and heal. SEEK MEDICAL CARE IF:   Your pain medicine does not help.   Your pain gets worse.  You develop new symptoms. MAKE SURE YOU:   Understand these instructions.  Will watch your condition.  Will get help right away if you are not doing well or get worse.   This information is not intended to replace advice given to you by your health care provider. Make sure you discuss any questions you have with your health care provider.   Document Released: 07/15/2001 Document Revised: 11/10/2014 Document Reviewed: 02/22/2014 Elsevier Interactive Patient Education 2016 Elsevier Inc.  

## 2015-10-14 ENCOUNTER — Other Ambulatory Visit: Payer: Self-pay | Admitting: Internal Medicine

## 2015-10-19 ENCOUNTER — Encounter: Payer: Self-pay | Admitting: Internal Medicine

## 2015-11-09 ENCOUNTER — Telehealth: Payer: Self-pay | Admitting: Cardiology

## 2015-11-09 NOTE — Progress Notes (Signed)
HPI: FU CAD and SVT. Patient states that he has had atrial fibrillation after surgeries in the past. Cardiac cath in April of 2012 revealed normal LV function, normal pulmonary pressures and pulmonary capillary wedge pressure and nonobstructive coronary disease with the most significant lesion being a 50% LAD. Carotid Dopplers in May of 2013 were normal. Echocardiogram repeated in Jan of 2015. This revealed normal LV function, grade 1 diastolic dysfunction. Nuclear study 7/15 showed EF 64 and normal perfusion. Abdominal ultrasound October 2016 showed no aneurysm. Patient also seen by Dr Lovena Le previously for SVT; treated medically. Had Holter 11/16 that showed NSR with pacs and pvcs. Since last seen,   Current Outpatient Prescriptions  Medication Sig Dispense Refill  . aspirin 81 MG tablet Take 81 mg by mouth every evening.     . Canagliflozin (INVOKANA) 300 MG TABS Take 1 tablet by mouth daily.    . CHROMIUM-CINNAMON PO Take 2,000 mg by mouth daily.    . Cyanocobalamin (VITAMIN B 12 PO) Take 1 tablet by mouth daily.    . furosemide (LASIX) 40 MG tablet Take 40 mg by mouth daily as needed (swelling).    Marland Kitchen LANTUS SOLOSTAR 100 UNIT/ML Solostar Pen Inject 10 Units into the skin daily at 10 pm.   1  . metFORMIN (GLUCOPHAGE) 500 MG tablet Take 1,000 mg by mouth 2 (two) times daily with a meal. Take 2 tablets twice a day    . pravastatin (PRAVACHOL) 40 MG tablet Take 1 tablet (40 mg total) by mouth every evening. 90 tablet 3  . Turmeric 450 MG CAPS Take 450 mg by mouth daily.    . verapamil (CALAN-SR) 240 MG CR tablet Take 240 mg by mouth daily.    . verapamil (CALAN-SR) 240 MG CR tablet TAKE ONE TABLET EACH DAY 90 tablet 1   No current facility-administered medications for this visit.     Past Medical History  Diagnosis Date  . DYSLIPIDEMIA 11/29/2009  . HYPERTENSION, ESSENTIAL 11/29/2009  . URTICARIA 02/06/2010  . BPH (benign prostatic hypertrophy)   . Osteoarthritis   . DM (diabetes  mellitus) ([place])   . OSA (obstructive sleep apnea) 03/26/2011    CPAP 9 cm H2O  . COPD (chronic obstructive pulmonary disease) ([place]) 05/01/2011    PFT 05/01/11>>FEV1 2.07(72%), FEV1% 65, TLC 6.79(111%), DLCO 69%, no BD  . CAD (coronary artery disease)   . SVT (supraventricular tachycardia) [facility])     Past Surgical History  Procedure Laterality Date  . Appendectomy  1963  . Spine surgery      spinal fusion - L4, L5  . Total knee arthroplasty  2007  . Transurethral resection of prostate      bph  . Nasal sinus surgery  1989  . Bilateral carpal tunnel release Bilateral     Social History   Social History  . Marital Status: Married    Spouse Name: N/A  . Number of Children: 3  . Years of Education: N/A   Occupational History  . Retired Other   Social History Main Topics  . Smoking status: Current Every Day Smoker -- 1.00 packs/day for 50 years    Types: Cigarettes  . Smokeless tobacco: Never Used     Comment: pt is again smoking < 1 PPD  . Alcohol Use: Yes     Comment: occasionallly  . Drug Use: No  . Sexual Activity: Not on file   Other Topics Concern  . Not on file   Social  History Narrative    ROS: no fevers or chills, productive cough, hemoptysis, dysphasia, odynophagia, melena, hematochezia, dysuria, hematuria, rash, seizure activity, orthopnea, PND, pedal edema, claudication. Remaining systems are negative.  Physical Exam: Well-developed well-nourished in no acute distress.  Skin is warm and dry.  HEENT is normal.  Neck is supple.  Chest is clear to auscultation with normal expansion.  Cardiovascular exam is regular rate and rhythm.  Abdominal exam nontender or distended. No masses palpated. Extremities show no edema. neuro grossly intact  ECG     This encounter was created in error - please disregard.

## 2015-11-09 NOTE — Telephone Encounter (Signed)
Close encounter 

## 2015-11-12 ENCOUNTER — Encounter: Payer: Medicare Other | Admitting: Cardiology

## 2015-11-21 ENCOUNTER — Ambulatory Visit (INDEPENDENT_AMBULATORY_CARE_PROVIDER_SITE_OTHER): Payer: Medicare Other | Admitting: Family Medicine

## 2015-11-21 VITALS — BP 110/86 | HR 116 | Temp 98.2°F | Ht 68.0 in | Wt 240.8 lb

## 2015-11-21 DIAGNOSIS — G4733 Obstructive sleep apnea (adult) (pediatric): Secondary | ICD-10-CM

## 2015-11-21 NOTE — Progress Notes (Signed)
Subjective:    Patient ID: Steve Charles, male    DOB: 26-Jun-1946, 70 y.o.   MRN: [id number]  HPI    Patient is seen with concerns regarding ability to return to work. He has known history of obstructive sleep apnea but does not use his CPAP. He states he has not tolerated this well in the past. He has also tried dental appliance without much relief. He has a job with [employer] and apparently there are coworkers who reported that he was "sleepy "sometimes with driving. He denies ever falling asleep at the wheel. Patient relates over the past few months that his wife has been hospitalized and he has not slept well at night because of her absence. Over the past week she has returned home and he is sleeping much better. He has not had any recent sleep study since 2012. He does have some nocturia about every 2-3 hours. No obstructive urinary symptoms. Denies urgency.   He has lost about 50-60 pounds since his last sleep study and he is convinced that he does not have any further sleep apnea.  Past Medical History  Diagnosis Date  . DYSLIPIDEMIA 11/29/2009  . HYPERTENSION, ESSENTIAL 11/29/2009  . URTICARIA 02/06/2010  . BPH (benign prostatic hypertrophy)   . Osteoarthritis   . DM (diabetes mellitus) ([place])   . OSA (obstructive sleep apnea) 03/26/2011    CPAP 9 cm H2O  . COPD (chronic obstructive pulmonary disease) ([place]) 05/01/2011    PFT 05/01/11>>FEV1 2.07(72%), FEV1% 65, TLC 6.79(111%), DLCO 69%, no BD  . CAD (coronary artery disease)   . SVT (supraventricular tachycardia) [facility])    Past Surgical History  Procedure Laterality Date  . Appendectomy  1963  . Spine surgery      spinal fusion - L4, L5  . Total knee arthroplasty  2007  . Transurethral resection of prostate      bph  . Nasal sinus surgery  1989  . Bilateral carpal tunnel release Bilateral     reports that he has been smoking Cigarettes.  He has a 50 pack-year smoking history. He has never used smokeless tobacco. He  reports that he drinks alcohol. He reports that he does not use illicit drugs. family history includes Arthritis in his other; Cancer in his father; Diabetes in his other; Prostate cancer in his brother. Allergies  Allergen Reactions  . Shellfish Allergy Hives  . Codeine Sulfate Hives  . Morphine And Related Hives    Oral morphine/ hives   . Propoxyphene N-Acetaminophen Hives    REACTION: hives  . Ramipril Hives      Review of Systems  Constitutional: Negative for fatigue and unexpected weight change.  Eyes: Negative for visual disturbance.  Respiratory: Negative for cough, chest tightness and shortness of breath.   Cardiovascular: Negative for chest pain, palpitations and leg swelling.  Endocrine: Negative for polydipsia and polyuria.  Neurological: Negative for dizziness, syncope, weakness, light-headedness and headaches.  Psychiatric/Behavioral: Negative for sleep disturbance and dysphoric mood. The patient is not nervous/anxious.        Objective:   Physical Exam  Constitutional: He is oriented to person, place, and time. He appears well-developed and well-nourished.  Cardiovascular: Normal rate and regular rhythm.   Pulmonary/Chest: Effort normal and breath sounds normal. No respiratory distress. He has no wheezes. He has no rales.  Musculoskeletal: He exhibits no edema.  Neurological: He is alert and oriented to person, place, and time. No cranial nerve deficit.  Assessment & Plan:   Daytime  somnolence in a patient with history of obstructive sleep apnea. He also is describing fairly frequent nocturia. No obstructive urinary symptoms. Recommend set up repeat sleep study to assess his sleep apnea. We have not recommended any further daytime driving with his job until this is further clarified. He is encouraged to continue with weight loss efforts.

## 2015-11-21 NOTE — Progress Notes (Signed)
Pre visit review using our clinic review tool, if applicable. No additional management support is needed unless otherwise documented below in the visit note. 

## 2015-11-26 ENCOUNTER — Ambulatory Visit (HOSPITAL_BASED_OUTPATIENT_CLINIC_OR_DEPARTMENT_OTHER): Payer: Medicare Other | Attending: Family Medicine | Admitting: Radiology

## 2015-11-26 VITALS — Ht 68.0 in | Wt 236.0 lb

## 2015-11-26 DIAGNOSIS — G4761 Periodic limb movement disorder: Secondary | ICD-10-CM | POA: Diagnosis not present

## 2015-11-26 DIAGNOSIS — G4736 Sleep related hypoventilation in conditions classified elsewhere: Secondary | ICD-10-CM | POA: Insufficient documentation

## 2015-11-26 DIAGNOSIS — G4733 Obstructive sleep apnea (adult) (pediatric): Secondary | ICD-10-CM | POA: Insufficient documentation

## 2015-11-26 DIAGNOSIS — I493 Ventricular premature depolarization: Secondary | ICD-10-CM | POA: Diagnosis not present

## 2015-11-26 DIAGNOSIS — Z794 Long term (current) use of insulin: Secondary | ICD-10-CM | POA: Diagnosis not present

## 2015-11-26 NOTE — Progress Notes (Signed)
HPI: FU CAD and SVT. Patient states that he has had atrial fibrillation after surgeries in the past. Cardiac cath in April of 2012 revealed normal LV function, normal pulmonary pressures and pulmonary capillary wedge pressure and nonobstructive coronary disease with the most significant lesion being a 50% LAD. Carotid Dopplers in May of 2013 were normal. Echocardiogram repeated in Jan of 2015. This revealed normal LV function, grade 1 diastolic dysfunction. Nuclear study 7/15 showed EF 64 and normal perfusion. Abdominal ultrasound October 2016 showed no aneurysm. Patient also with SVT treated with verapamil. Holter monitor November 2016 showed sinus rhythm with frequent PVCs and PACs. There are occasional ventricular couplets. Since last seen,   Current Outpatient Prescriptions  Medication Sig Dispense Refill  . aspirin 81 MG tablet Take 81 mg by mouth every evening.     . Canagliflozin (INVOKANA) 300 MG TABS Take 1 tablet by mouth daily.    . CHROMIUM-CINNAMON PO Take 2,000 mg by mouth daily.    . Cyanocobalamin (VITAMIN B 12 PO) Take 1 tablet by mouth daily.    . furosemide (LASIX) 40 MG tablet Take 40 mg by mouth daily as needed (swelling).    Marland Kitchen glimepiride (AMARYL) 1 MG tablet Reported on 11/21/2015  4  . LANTUS SOLOSTAR 100 UNIT/ML Solostar Pen Inject 10 Units into the skin daily at 10 pm.   1  . metFORMIN (GLUCOPHAGE) 500 MG tablet Take 1,000 mg by mouth 2 (two) times daily with a meal. Take 2 tablets twice a day    . pravastatin (PRAVACHOL) 40 MG tablet Take 1 tablet (40 mg total) by mouth every evening. 90 tablet 3  . Turmeric 450 MG CAPS Take 450 mg by mouth daily.    . verapamil (CALAN-SR) 240 MG CR tablet TAKE ONE TABLET EACH DAY 90 tablet 1   No current facility-administered medications for this visit.     Past Medical History  Diagnosis Date  . DYSLIPIDEMIA 11/29/2009  . HYPERTENSION, ESSENTIAL 11/29/2009  . URTICARIA 02/06/2010  . BPH (benign prostatic hypertrophy)   .  Osteoarthritis   . DM (diabetes mellitus) ([place])   . OSA (obstructive sleep apnea) 03/26/2011    CPAP 9 cm H2O  . COPD (chronic obstructive pulmonary disease) ([place]) 05/01/2011    PFT 05/01/11>>FEV1 2.07(72%), FEV1% 65, TLC 6.79(111%), DLCO 69%, no BD  . CAD (coronary artery disease)   . SVT (supraventricular tachycardia) [facility])     Past Surgical History  Procedure Laterality Date  . Appendectomy  1963  . Spine surgery      spinal fusion - L4, L5  . Total knee arthroplasty  2007  . Transurethral resection of prostate      bph  . Nasal sinus surgery  1989  . Bilateral carpal tunnel release Bilateral     Social History   Social History  . Marital Status: Married    Spouse Name: N/A  . Number of Children: 3  . Years of Education: N/A   Occupational History  . Retired Other   Social History Main Topics  . Smoking status: Current Every Day Smoker -- 1.00 packs/day for 50 years    Types: Cigarettes  . Smokeless tobacco: Never Used     Comment: pt is again smoking < 1 PPD  . Alcohol Use: Yes     Comment: occasionallly  . Drug Use: No  . Sexual Activity: Not on file   Other Topics Concern  . Not on file   Social History  Narrative    Family History  Problem Relation Age of Onset  . Arthritis Other   . Diabetes Other   . Prostate cancer Brother   . Cancer Father     ROS: no fevers or chills, productive cough, hemoptysis, dysphasia, odynophagia, melena, hematochezia, dysuria, hematuria, rash, seizure activity, orthopnea, PND, pedal edema, claudication. Remaining systems are negative.  Physical Exam: Well-developed well-nourished in no acute distress.  Skin is warm and dry.  HEENT is normal.  Neck is supple.  Chest is clear to auscultation with normal expansion.  Cardiovascular exam is regular rate and rhythm.  Abdominal exam nontender or distended. No masses palpated. Extremities show no edema. neuro grossly intact  ECG     This encounter was created in  error - please disregard.

## 2015-12-01 DIAGNOSIS — G4733 Obstructive sleep apnea (adult) (pediatric): Secondary | ICD-10-CM | POA: Diagnosis not present

## 2015-12-01 NOTE — Progress Notes (Signed)
Patient Name: Steve Charles, Steve Charles Date: 11/26/2015 Gender: Male D.O.B: August 30, 1946 Age (years): 89 Referring Provider: Alinda Sierras Burchette Height (inches): 68 Interpreting Physician: Baird Lyons MD, ABSM Weight (lbs): 234 RPSGT: Zadie Rhine BMI: 36 MRN: [id number] Neck Size: 17.50 CLINICAL INFORMATION Sleep Study Type: Split Night CPAP Indication for sleep study: OSA Epworth Sleepiness Score: 11  SLEEP STUDY TECHNIQUE As per the AASM Manual for the Scoring of Sleep and Associated Events v2.3 (April 2016) with a hypopnea requiring 4% desaturations. The channels recorded and monitored were frontal, central and occipital EEG, electrooculogram (EOG), submentalis EMG (chin), nasal and oral airflow, thoracic and abdominal wall motion, anterior tibialis EMG, snore microphone, electrocardiogram, and pulse oximetry. Continuous positive airway pressure (CPAP) was initiated when the patient met split night criteria and was titrated according to treat sleep-disordered breathing.  MEDICATIONS Medications taken by the patient : charted for review Medications administered by patient during sleep study :LANTUS SOLOSTAR  RESPIRATORY PARAMETERS Diagnostic Total AHI (/hr): 15.8 RDI (/hr): 25.3 OA Index (/hr): 3.2 CA Index (/hr): 0.0 REM AHI (/hr): 27.3 NREM AHI (/hr): 14.3 Supine AHI (/hr): 69.5 Non-supine AHI (/hr): 9.86 Min O2 Sat (%): 80.00 Mean O2 (%): 88.45 Time below 88% (min): 46.0     Titration Optimal Pressure (cm): 9 AHI at Optimal Pressure (/hr): 0 Min O2 at Optimal Pressure (%): 92% (1 L O2) Supine % at Optimal (%): N/A Sleep % at Optimal (%): N/A      SLEEP ARCHITECTURE The recording time for the entire night was 353.7 minutes. During a baseline period of 151.4 minutes, the patient slept for 94.7 minutes in REM and nonREM, yielding a sleep efficiency of 62.6%. Sleep onset after lights out was 9.7 minutes with a REM latency of 104.0 minutes. The patient spent 17.42% of the night  in stage N1 sleep, 70.43% in stage N2 sleep, 0.53% in stage N3 and 11.62% in REM. During the titration period of 196.5 minutes, the patient slept for 140.0 minutes in REM and nonREM, yielding a sleep efficiency of 71.2%. Sleep onset after CPAP initiation was 14.5 minutes with a REM latency of 29.5 minutes. The patient spent 9.64% of the night in stage N1 sleep, 75.71% in stage N2 sleep, 0.00% in stage N3 and 14.64% in REM.  CARDIAC DATA The 2 lead EKG demonstrated sinus rhythm. The mean heart rate was 78.07 beats per minute. Other EKG findings include:  frequent PVCs.  LEG MOVEMENT DATA The total Periodic Limb Movements of Sleep (PLMS) were 174. The PLMS index was 44.24 . PLM with arousals during titration 12 ( 5.1/ hr)  IMPRESSIONS - Moderate obstructive sleep apnea occurred during the diagnostic portion of the study(AHI = 15.8/hour). An PAP pressure of 9 cwp was chosen during available time. - No significant central sleep apnea occurred during the diagnostic portion of the study (CAI = 0.0/hour). - Severe oxygen desaturation was noted during the diagnostic portion of the study (Min O2 = 80.00%). Supplemental O2 was added at 1 L/M starting at 2:19 AM, due to low saturations. - No snoring was audible during the diagnostic portion of the study. - EKG findings include PVCs. - Moderate periodic limb movements of sleep occurred during the study. PLM with arousal during the titration phase 5.1/ hr.  DIAGNOSIS - Obstructive Sleep Apnea (327.23 [G47.33 ICD-10]) - Nocturnal Hypoxemia (327.26 [G47.36 ICD-10])  RECOMMENDATIONS - Recommend home trial of CPAP 9 cwp. Patient wore a smal Resmed AirFit F20 full face mask with heated humidifier. - Patient required supplemental oxygen  at 1 L/M to hold saturations about 80% before final CPAP titration. Suggest recheck overnight oximetry with CPAP at home. - Avoid alcohol, sedatives and other CNS depressants that may worsen sleep apnea and disrupt normal sleep  architecture. - Sleep hygiene should be reviewed to assess factors that may improve sleep quality. - Weight management and regular exercise should be initiated or continued.  Deneise Lever Diplomate, American Board of Sleep Medicine  ELECTRONICALLY SIGNED ON:  12/01/2015, 11:15 AM [place] PH: [phone]   FX: [phone] [place]

## 2015-12-06 ENCOUNTER — Encounter: Payer: Medicare Other | Attending: Family Medicine | Admitting: *Deleted

## 2015-12-06 VITALS — Ht 68.0 in | Wt 237.5 lb

## 2015-12-06 DIAGNOSIS — Z029 Encounter for administrative examinations, unspecified: Secondary | ICD-10-CM | POA: Insufficient documentation

## 2015-12-06 DIAGNOSIS — E119 Type 2 diabetes mellitus without complications: Secondary | ICD-10-CM

## 2015-12-06 NOTE — Progress Notes (Signed)
Medical Nutrition Therapy: 12/06/2015  Appt start time:  0830    end time:  0900  Patient states his last A1c was 7.1 %   Assessment:  Primary concerns today: patient here for follow up diabetes education. He states he is walking consistently on average almost 10,000 steps every day. He has a new BG tester "Kevan Rosebush" that connects with an APP on his phone so he can track BG, food and exercise data easily. He states his BG continues running 74-90 mg/dl.   TANITA  BODY COMP RESULTS on 07/21/13  Weight 255.5 lb   BMI (kg/m^2) 38.8   Fat Mass (lbs) 97.5 lb   Fat Free Mass (lbs) 158.0 lb   Total Body Water (lbs) 115.5 lb   TANITA  BODY COMP RESULTS on 10/21/13  Weight 257.5 lb   BMI (kg/m^2) 39.2 lb   Fat Mass (lbs) 97.5 lb   Fat Free Mass (lbs) 160.0 lb   Total Body Water (lbs) 117.0 lb   TANITA  BODY COMP RESULTS on 01/20/14  Weight 248.5 lb   BMI (kg/m^2) 37.8   Fat Mass (lbs) 96.0 lb   Fat Free Mass (lbs) 152.5 lb   Total Body Water (lbs) 111.5 lb   TANITA  BODY COMP RESULTS  Weight 250.0 lb   BMI (kg/m^2) 38.0   Fat Mass (lbs) 100.0 lb   Fat Free Mass (lbs) 150.0 lb   Total Body Water (lbs) 110.0 lb   TANITA  BODY COMP RESULTS  07/27/2014 Weight 249.0 lb   BMI (kg/m^2) 37.9   Fat Mass (lbs) 103.5   Fat Free Mass (lbs) 145.5   Total Body Water (lbs) 106.5 lb   TANITA  BODY COMP RESULTS 10/12/2014  Weight 251 lb   BMI (kg/m^2) 38.2   Fat Mass (lbs) 92.0 lb   Fat Free Mass (lbs) 159.0 lb   Total Body Water (lbs) 116.5 lb   TANITA  BODY COMP RESULTS 12/07/2014   Weight 258 lb   BMI (kg/m^2) 39.2   Fat Mass (lbs) 86.5 lb   Fat Free Mass (lbs) 171.5 lb   Total Body Water (lbs) 125.5 lb    TANITA  BODY COMP RESULTS 05/10/15  Weight (lbs) 241.0 lb   BMI (kg/m^2) 36.6   Fat Mass (lbs) 93.5   Fat Free Mass (lbs) 147.5   Total Body Water (lbs) 108.0   TANITA  BODY COMP RESULTS 08/30/15  Weight Lbs) 235.0 lb   BMI (kg/m^2) 35.7   Fat Mass (lbs) 84.5   Fat Free Mass (lbs)  150.5   Total Body Water (lbs) 110.0   TANITA  BODY COMP RESULTS 12/06/15  Weight (lbs) 237.5   BMI (kg/m^2) 36.1   Fat Mass (lbs) 89.0   Fat Free Mass (lbs) 148.5   Total Body Water (lbs) 108.5   This is a different Tanita Scale than used in the past which could explain variations in total weight as well as increase in fat mass and decrease in fat free mass, which contradicts his increase in activity level and decrease in waist circumference he reports with decrease in belt size. Will weigh again next visit to follow.  MEDICATIONS: see list, Diabetes medications are now Invokana, Metformin and Lantus insulin   Usual physical activity: walking as able about 10,000 steps per day    Intervention: Nutrition counseling and diabetes education continued. Commended him on his continued progress!   Plan:  Continue to aim for 3 Carb Choices per meal (45 grams) +/-  1 either way  Continue to aim for 0-2 Carbs per snack if hungry  Continue reading food labels for Total Carbohydrate and Fat Grams of foods Continue with your activity level daily as tolerated Continue checking BG at alternate times per day on occasion Continue taking Lantus at night as directed by your MD    Handouts given during visit include: Tanita Scale results handout  Monitoring/Evaluation:  Dietary intake, exercise, reading food labels, and body weight in 3 months.

## 2015-12-07 ENCOUNTER — Encounter: Payer: Medicare Other | Admitting: Cardiology

## 2015-12-07 ENCOUNTER — Encounter: Payer: Self-pay | Admitting: Cardiology

## 2015-12-07 ENCOUNTER — Ambulatory Visit (INDEPENDENT_AMBULATORY_CARE_PROVIDER_SITE_OTHER): Payer: Medicare Other | Admitting: Cardiology

## 2015-12-07 VITALS — BP 110/60 | HR 80 | Ht 68.0 in | Wt 238.8 lb

## 2015-12-07 DIAGNOSIS — I2510 Atherosclerotic heart disease of native coronary artery without angina pectoris: Secondary | ICD-10-CM | POA: Diagnosis not present

## 2015-12-07 DIAGNOSIS — I10 Essential (primary) hypertension: Secondary | ICD-10-CM

## 2015-12-07 DIAGNOSIS — I471 Supraventricular tachycardia: Secondary | ICD-10-CM

## 2015-12-07 DIAGNOSIS — I2583 Coronary atherosclerosis due to lipid rich plaque: Principal | ICD-10-CM

## 2015-12-07 DIAGNOSIS — E785 Hyperlipidemia, unspecified: Secondary | ICD-10-CM | POA: Diagnosis not present

## 2015-12-07 DIAGNOSIS — Z720 Tobacco use: Secondary | ICD-10-CM

## 2015-12-07 NOTE — Patient Instructions (Signed)
Your physician wants you to follow-up in: 6 MONTHS WITH DR CRENSHAW You will receive a reminder letter in the mail two months in advance. If you don't receive a letter, please call our office to schedule the follow-up appointment.   If you need a refill on your cardiac medications before your next appointment, please call your pharmacy.  

## 2015-12-07 NOTE — Progress Notes (Signed)
HPI: FU CAD and SVT. Patient states that he has had atrial fibrillation after surgeries in the past. Cardiac cath in April of 2012 revealed normal LV function, normal pulmonary pressures and pulmonary capillary wedge pressure and nonobstructive coronary disease with the most significant lesion being a 50% LAD. Carotid Dopplers in May of 2013 were normal. Echocardiogram repeated in Jan of 2015. This revealed normal LV function, grade 1 diastolic dysfunction. Nuclear study 7/15 showed EF 64 and normal perfusion. Abdominal ultrasound October 2016 showed no aneurysm. Patient also with SVT treated with verapamil. Holter monitor November 2016 showed sinus rhythm with frequent PVCs and PACs. There are occasional ventricular couplets. Since last seen, the patient denies any dyspnea on exertion, orthopnea, PND, pedal edema, syncope or chest pain. Occasional nonsustained palpitations.   Current Outpatient Prescriptions  Medication Sig Dispense Refill  . aspirin 81 MG tablet Take 81 mg by mouth every evening.     . Canagliflozin (INVOKANA) 300 MG TABS Take 1 tablet by mouth daily.    . CHROMIUM-CINNAMON PO Take 2,000 mg by mouth daily.    . Cyanocobalamin (VITAMIN B 12 PO) Take 1 tablet by mouth daily.    . furosemide (LASIX) 40 MG tablet Take 40 mg by mouth daily as needed (swelling).    Marland Kitchen glimepiride (AMARYL) 1 MG tablet Reported on 11/21/2015  4  . LANTUS SOLOSTAR 100 UNIT/ML Solostar Pen Inject 10 Units into the skin daily at 10 pm.   1  . metFORMIN (GLUCOPHAGE) 500 MG tablet Take 1,000 mg by mouth 2 (two) times daily with a meal. Take 2 tablets twice a day    . pravastatin (PRAVACHOL) 40 MG tablet Take 1 tablet (40 mg total) by mouth every evening. 90 tablet 3  . Turmeric 450 MG CAPS Take 450 mg by mouth daily.    . verapamil (CALAN-SR) 240 MG CR tablet TAKE ONE TABLET EACH DAY 90 tablet 1   No current facility-administered medications for this visit.     Past Medical History  Diagnosis Date   . DYSLIPIDEMIA 11/29/2009  . HYPERTENSION, ESSENTIAL 11/29/2009  . URTICARIA 02/06/2010  . BPH (benign prostatic hypertrophy)   . Osteoarthritis   . DM (diabetes mellitus) ([place])   . OSA (obstructive sleep apnea) 03/26/2011    CPAP 9 cm H2O  . COPD (chronic obstructive pulmonary disease) ([place]) 05/01/2011    PFT 05/01/11>>FEV1 2.07(72%), FEV1% 65, TLC 6.79(111%), DLCO 69%, no BD  . CAD (coronary artery disease)   . SVT (supraventricular tachycardia) [facility])     Past Surgical History  Procedure Laterality Date  . Appendectomy  1963  . Spine surgery      spinal fusion - L4, L5  . Total knee arthroplasty  2007  . Transurethral resection of prostate      bph  . Nasal sinus surgery  1989  . Bilateral carpal tunnel release Bilateral     Social History   Social History  . Marital Status: Married    Spouse Name: N/A  . Number of Children: 3  . Years of Education: N/A   Occupational History  . Retired Other   Social History Main Topics  . Smoking status: Current Every Day Smoker -- 1.00 packs/day for 50 years    Types: Cigarettes  . Smokeless tobacco: Never Used     Comment: pt is again smoking < 1 PPD  . Alcohol Use: Yes     Comment: occasionallly  . Drug Use: No  . Sexual  Activity: Not on file   Other Topics Concern  . Not on file   Social History Narrative    Family History  Problem Relation Age of Onset  . Arthritis Other   . Diabetes Other   . Prostate cancer Brother   . Cancer Father     ROS: no fevers or chills, productive cough, hemoptysis, dysphasia, odynophagia, melena, hematochezia, dysuria, hematuria, rash, seizure activity, orthopnea, PND, pedal edema, claudication. Remaining systems are negative.  Physical Exam: Well-developed well-nourished in no acute distress.  Skin is warm and dry.  HEENT is normal.  Neck is supple.  Chest is clear to auscultation with normal expansion.  Cardiovascular exam is regular rate and rhythm.  Abdominal exam nontender  or distended. No masses palpated. Extremities show no edema. neuro grossly intact

## 2015-12-07 NOTE — Assessment & Plan Note (Signed)
Blood pressure controlled. Continue present medications. 

## 2015-12-07 NOTE — Assessment & Plan Note (Signed)
Continue aspirin and statin. 

## 2015-12-07 NOTE — Assessment & Plan Note (Signed)
- 

## 2015-12-07 NOTE — Assessment & Plan Note (Signed)
Patient counseled on discontinuing. 

## 2015-12-07 NOTE — Assessment & Plan Note (Signed)
Continue present dose of Lasix. 

## 2015-12-07 NOTE — Assessment & Plan Note (Signed)
Continue statin. 

## 2015-12-10 ENCOUNTER — Encounter (HOSPITAL_COMMUNITY): Payer: Self-pay | Admitting: *Deleted

## 2015-12-10 ENCOUNTER — Emergency Department (HOSPITAL_COMMUNITY)
Admission: EM | Admit: 2015-12-10 | Discharge: 2015-12-10 | Disposition: A | Discharge status: Home / Self Care | Payer: Medicare Other | Attending: Emergency Medicine | Admitting: Emergency Medicine

## 2015-12-10 DIAGNOSIS — I10 Essential (primary) hypertension: Secondary | ICD-10-CM | POA: Insufficient documentation

## 2015-12-10 DIAGNOSIS — I2510 Atherosclerotic heart disease of native coronary artery without angina pectoris: Secondary | ICD-10-CM | POA: Insufficient documentation

## 2015-12-10 DIAGNOSIS — F17210 Nicotine dependence, cigarettes, uncomplicated: Secondary | ICD-10-CM | POA: Insufficient documentation

## 2015-12-10 DIAGNOSIS — M549 Dorsalgia, unspecified: Secondary | ICD-10-CM | POA: Diagnosis not present

## 2015-12-10 DIAGNOSIS — M79605 Pain in left leg: Secondary | ICD-10-CM | POA: Diagnosis not present

## 2015-12-10 DIAGNOSIS — E119 Type 2 diabetes mellitus without complications: Secondary | ICD-10-CM | POA: Insufficient documentation

## 2015-12-10 DIAGNOSIS — J449 Chronic obstructive pulmonary disease, unspecified: Secondary | ICD-10-CM | POA: Insufficient documentation

## 2015-12-10 NOTE — ED Notes (Signed)
Offered pt pain medicine at triage, he refused due to allergies.

## 2015-12-10 NOTE — ED Notes (Signed)
Pt reports hx of back pain and surgery. Onset yesterday of increase in pain and pain radiating down left leg which is abnormal for him. Pain is to entire leg and denies swelling. Ambulatory at triage.

## 2015-12-12 ENCOUNTER — Ambulatory Visit: Payer: Medicare Other | Admitting: Family Medicine

## 2015-12-12 ENCOUNTER — Encounter: Payer: Self-pay | Admitting: *Deleted

## 2015-12-12 DIAGNOSIS — M549 Dorsalgia, unspecified: Secondary | ICD-10-CM | POA: Diagnosis not present

## 2015-12-12 DIAGNOSIS — M4316 Spondylolisthesis, lumbar region: Secondary | ICD-10-CM | POA: Diagnosis not present

## 2015-12-12 DIAGNOSIS — M5136 Other intervertebral disc degeneration, lumbar region: Secondary | ICD-10-CM | POA: Diagnosis not present

## 2015-12-12 DIAGNOSIS — M546 Pain in thoracic spine: Secondary | ICD-10-CM | POA: Diagnosis not present

## 2015-12-12 DIAGNOSIS — M4726 Other spondylosis with radiculopathy, lumbar region: Secondary | ICD-10-CM | POA: Diagnosis not present

## 2015-12-12 DIAGNOSIS — M5416 Radiculopathy, lumbar region: Secondary | ICD-10-CM | POA: Diagnosis not present

## 2015-12-13 DIAGNOSIS — M4806 Spinal stenosis, lumbar region: Secondary | ICD-10-CM | POA: Diagnosis not present

## 2015-12-13 DIAGNOSIS — M4726 Other spondylosis with radiculopathy, lumbar region: Secondary | ICD-10-CM | POA: Diagnosis not present

## 2015-12-13 DIAGNOSIS — M5136 Other intervertebral disc degeneration, lumbar region: Secondary | ICD-10-CM | POA: Diagnosis not present

## 2015-12-13 DIAGNOSIS — M5416 Radiculopathy, lumbar region: Secondary | ICD-10-CM | POA: Diagnosis not present

## 2015-12-17 DIAGNOSIS — M545 Low back pain: Secondary | ICD-10-CM | POA: Diagnosis not present

## 2015-12-17 DIAGNOSIS — Z0000 Encounter for general adult medical examination without abnormal findings: Secondary | ICD-10-CM | POA: Diagnosis not present

## 2015-12-17 DIAGNOSIS — R81 Glycosuria: Secondary | ICD-10-CM | POA: Diagnosis not present

## 2015-12-17 DIAGNOSIS — G8929 Other chronic pain: Secondary | ICD-10-CM | POA: Diagnosis not present

## 2015-12-18 ENCOUNTER — Encounter (HOSPITAL_COMMUNITY): Payer: Self-pay | Admitting: Neurology

## 2015-12-18 ENCOUNTER — Emergency Department (HOSPITAL_COMMUNITY)
Admission: EM | Admit: 2015-12-18 | Discharge: 2015-12-18 | Disposition: A | Discharge status: Home / Self Care | Payer: Medicare Other | Attending: Emergency Medicine | Admitting: Emergency Medicine

## 2015-12-18 DIAGNOSIS — M4800 Spinal stenosis, site unspecified: Secondary | ICD-10-CM | POA: Diagnosis not present

## 2015-12-18 DIAGNOSIS — F17210 Nicotine dependence, cigarettes, uncomplicated: Secondary | ICD-10-CM | POA: Insufficient documentation

## 2015-12-18 DIAGNOSIS — J449 Chronic obstructive pulmonary disease, unspecified: Secondary | ICD-10-CM | POA: Insufficient documentation

## 2015-12-18 DIAGNOSIS — E119 Type 2 diabetes mellitus without complications: Secondary | ICD-10-CM | POA: Insufficient documentation

## 2015-12-18 DIAGNOSIS — G4733 Obstructive sleep apnea (adult) (pediatric): Secondary | ICD-10-CM | POA: Diagnosis not present

## 2015-12-18 DIAGNOSIS — Z7982 Long term (current) use of aspirin: Secondary | ICD-10-CM | POA: Diagnosis not present

## 2015-12-18 DIAGNOSIS — E785 Hyperlipidemia, unspecified: Secondary | ICD-10-CM | POA: Insufficient documentation

## 2015-12-18 DIAGNOSIS — Z79899 Other long term (current) drug therapy: Secondary | ICD-10-CM | POA: Insufficient documentation

## 2015-12-18 DIAGNOSIS — I2510 Atherosclerotic heart disease of native coronary artery without angina pectoris: Secondary | ICD-10-CM | POA: Diagnosis not present

## 2015-12-18 DIAGNOSIS — I10 Essential (primary) hypertension: Secondary | ICD-10-CM | POA: Insufficient documentation

## 2015-12-18 DIAGNOSIS — M545 Low back pain: Secondary | ICD-10-CM | POA: Diagnosis present

## 2015-12-18 DIAGNOSIS — Z791 Long term (current) use of non-steroidal anti-inflammatories (NSAID): Secondary | ICD-10-CM | POA: Diagnosis not present

## 2015-12-18 LAB — CBC
HEMATOCRIT: 50.1 % (ref 39.0–52.0)
HEMOGLOBIN: 16.5 g/dL (ref 13.0–17.0)
MCH: 29.4 pg (ref 26.0–34.0)
MCHC: 32.9 g/dL (ref 30.0–36.0)
MCV: 89.3 fL (ref 78.0–100.0)
Platelets: 244 10*3/uL (ref 150–400)
RBC: 5.61 MIL/uL (ref 4.22–5.81)
RDW: 14.4 % (ref 11.5–15.5)
WBC: 9.4 10*3/uL (ref 4.0–10.5)

## 2015-12-18 LAB — BASIC METABOLIC PANEL
Anion gap: 18 — ABNORMAL HIGH (ref 5–15)
BUN: 17 mg/dL (ref 6–20)
CHLORIDE: 97 mmol/L — AB (ref 101–111)
CO2: 25 mmol/L (ref 22–32)
CREATININE: 0.91 mg/dL (ref 0.61–1.24)
Calcium: 9.4 mg/dL (ref 8.9–10.3)
GFR calc Af Amer: >60 mL/min (ref >60)
GFR calc non Af Amer: >60 mL/min (ref >60)
Glucose, Bld: 133 mg/dL — ABNORMAL HIGH (ref 65–99)
Potassium: 3.5 mmol/L (ref 3.5–5.1)
SODIUM: 140 mmol/L (ref 135–145)

## 2015-12-18 MED ORDER — HYDROMORPHONE HCL 1 MG/ML IJ SOLN
2.0000 mg | Freq: Once | INTRAMUSCULAR | Status: AC
Start: 2015-12-18 — End: 2015-12-18
  Administered 2015-12-18: 1 mg via INTRAVENOUS
  Filled 2015-12-18 (×2): qty 2

## 2015-12-18 MED ORDER — DIAZEPAM 5 MG PO TABS: 5.0000 mg | ORAL_TABLET | Freq: Three times a day (TID) | ORAL | Status: DC | PRN

## 2015-12-18 MED ORDER — HYDROMORPHONE HCL 1 MG/ML IJ SOLN: 1.0000 mg | Freq: Once | INTRAMUSCULAR | Status: DC

## 2015-12-18 MED ORDER — OXYCODONE-ACETAMINOPHEN 5-325 MG PO TABS: 1.0000 | ORAL_TABLET | ORAL | Status: DC | PRN

## 2015-12-18 MED ORDER — DIAZEPAM 5 MG PO TABS
5.0000 mg | ORAL_TABLET | Freq: Once | ORAL | Status: AC
  Administered 2015-12-18: 5 mg via ORAL
  Filled 2015-12-18: qty 1

## 2015-12-18 MED ORDER — KETOROLAC TROMETHAMINE 30 MG/ML IJ SOLN
30.0000 mg | Freq: Once | INTRAMUSCULAR | Status: AC
  Administered 2015-12-18: 30 mg via INTRAVENOUS
  Filled 2015-12-18: qty 1

## 2015-12-18 NOTE — ED Notes (Addendum)
Pt reports L4 and L5 back pain with known stenosis for 50 years. Pain has been flaring up more often and is lower back and radiates down both legs. Has had 2 back surgeries for same problem. Pt is wheelchair, appears uncomfortable. Yesterday he went to a urologist to make sure it wasn't a kidney stone, and it was not. Is taking dilaudid pills for pain without relief. Denies urinary or bowel incont.

## 2015-12-18 NOTE — ED Provider Notes (Signed)
CSN: [id number]     Arrival date & time 12/18/15  0736 History   First MD Initiated Contact with Patient 12/18/15 [phone]     Chief Complaint  Patient presents with  . Back Pain     HPI Patient presents to the emergency department with a long-standing history of spinal stenosis for which she is being managed by his neurosurgeon on Dilaudid.  He reports that Dilaudid and muscle relaxants are not helping his pain.  He states his pain medicine helps for approximately one hour and then seems to wear off.  He is not scheduled for surgery at this point.  His had 2 prior lumbar surgeries.  His neurosurgeon Dr. Sherwood Gambler is trying to manage him nonoperatively at this time.  Patient denies new weakness in his legs.  No bowel or bladder incontinence or difficulties.  No fevers or chills.  No recent injury, trauma, fall.   Past Medical History  Diagnosis Date  . DYSLIPIDEMIA 11/29/2009  . HYPERTENSION, ESSENTIAL 11/29/2009  . URTICARIA 02/06/2010  . BPH (benign prostatic hypertrophy)   . Osteoarthritis   . DM (diabetes mellitus) ([place])   . OSA (obstructive sleep apnea) 03/26/2011    CPAP 9 cm H2O  . COPD (chronic obstructive pulmonary disease) ([place]) 05/01/2011    PFT 05/01/11>>FEV1 2.07(72%), FEV1% 65, TLC 6.79(111%), DLCO 69%, no BD  . CAD (coronary artery disease)   . SVT (supraventricular tachycardia) [facility])    Past Surgical History  Procedure Laterality Date  . Appendectomy  1963  . Spine surgery      spinal fusion - L4, L5  . Total knee arthroplasty  2007  . Transurethral resection of prostate      bph  . Nasal sinus surgery  1989  . Bilateral carpal tunnel release Bilateral    Family History  Problem Relation Age of Onset  . Arthritis Other   . Diabetes Other   . Prostate cancer Brother   . Cancer Father    Social History  Substance Use Topics  . Smoking status: Current Every Day Smoker -- 1.00 packs/day for 50 years    Types: Cigarettes  . Smokeless tobacco: Never Used   Comment: pt is again smoking < 1 PPD  . Alcohol Use: 0.0 oz/week    0 Standard drinks or equivalent per week     Comment: occasionallly    Review of Systems  All other systems reviewed and are negative.     Allergies  Shellfish allergy; Codeine sulfate; Morphine and related; Propoxyphene n-acetaminophen; and Ramipril  Home Medications   Prior to Admission medications   Medication Sig Start Date End Date Taking? Authorizing Provider  aspirin 81 MG tablet Take 81 mg by mouth every evening.    Yes Historical Provider, MD  Canagliflozin (INVOKANA) 300 MG TABS Take 1 tablet by mouth daily.   Yes Historical Provider, MD  CHROMIUM-CINNAMON PO Take 2,000 mg by mouth daily.   Yes Historical Provider, MD  Cyanocobalamin (VITAMIN B 12 PO) Take 1 tablet by mouth daily.   Yes Historical Provider, MD  diclofenac (VOLTAREN) 75 MG EC tablet Take 75 mg by mouth 2 (two) times daily. 12/13/15  Yes Historical Provider, MD  furosemide (LASIX) 40 MG tablet Take 40 mg by mouth daily as needed (swelling).   Yes Historical Provider, MD  glimepiride (AMARYL) 1 MG tablet Take 1 mg by mouth 2 (two) times daily. Reported on 11/21/2015 11/20/15  Yes Historical Provider, MD  HYDROmorphone (DILAUDID) 2 MG tablet Take 2 mg  by mouth every 6 (six) hours as needed. 12/11/15  Yes Historical Provider, MD  LANTUS SOLOSTAR 100 UNIT/ML Solostar Pen Inject 10 Units into the skin daily at 10 pm.  11/23/14  Yes Historical Provider, MD  metFORMIN (GLUCOPHAGE) 500 MG tablet Take 1,000 mg by mouth 2 (two) times daily with a meal. Take 2 tablets twice a day   Yes Historical Provider, MD  pravastatin (PRAVACHOL) 40 MG tablet Take 1 tablet (40 mg total) by mouth every evening. 03/26/15  Yes Lelon Perla, MD  Turmeric 450 MG CAPS Take 450 mg by mouth daily.   Yes Historical Provider, MD  verapamil (CALAN-SR) 240 MG CR tablet TAKE ONE TABLET EACH DAY 10/15/15  Yes Evans Lance, MD  diazepam (VALIUM) 5 MG tablet Take 1 tablet (5 mg total)  by mouth every 8 (eight) hours as needed (dizziness). 12/18/15   Jola Schmidt, MD  oxyCODONE-acetaminophen (PERCOCET/ROXICET) 5-325 MG tablet Take 1 tablet by mouth every 4 (four) hours as needed for severe pain. 12/18/15   Jola Schmidt, MD   BP 95/78 mmHg  Pulse 93  Temp(Src) 97.7 F (36.5 C) (Oral)  Resp 16  SpO2 96% Physical Exam  Constitutional: He is oriented to person, place, and time. He appears well-developed and well-nourished.  HENT:  Head: Normocephalic and atraumatic.  Eyes: EOM are normal.  Neck: Normal range of motion.  Cardiovascular: Normal rate, regular rhythm, normal heart sounds and intact distal pulses.   Pulmonary/Chest: Effort normal and breath sounds normal. No respiratory distress.  Abdominal: Soft. He exhibits no distension. There is no tenderness.  Musculoskeletal: Normal range of motion.  Full range of motion bilateral hips, knees, ankles.  No thoracic or lumbar point tenderness.  Mild paralumbar tenderness and spasm on the left.  Neurological: He is alert and oriented to person, place, and time.  Gait normal  Skin: Skin is warm and dry.  Psychiatric: He has a normal mood and affect. Judgment normal.  Nursing note and vitals reviewed.   ED Course  Procedures (including critical care time) Labs Review Labs Reviewed  BASIC METABOLIC PANEL - Abnormal; Notable for the following:    Chloride 97 (*)    Glucose, Bld 133 (*)    Anion gap 18 (*)    All other components within normal limits  CBC    Imaging Review No results found. I have personally reviewed and evaluated these images and lab results as part of my medical decision-making.   EKG Interpretation None      MDM   Final diagnoses:  Spinal stenosis, unspecified spinal region    11:59 AM Patient feels much better this time.  He is ambulatory in the emergency department.  I will add the addition of oxycodone and Valium to his regimen.  He will need to continue following up with his team.   He will likely need chronic pain management to better manage his symptoms.  He is a very reasonable management and understands the importance of following up.  He understands return the ER for new or worsening symptoms.  His last MRI was 1-2 weeks ago demonstrated no obvious new pathology.  He does not require repeat imaging at this time    Jola Schmidt, MD 12/18/15 1159

## 2015-12-18 NOTE — ED Notes (Signed)
Pt verbalizes understanding of instructions. 

## 2015-12-18 NOTE — ED Notes (Signed)
Heat applied to back with warm blankets.

## 2015-12-18 NOTE — ED Notes (Signed)
Pt ambulates easily with pain at 3/10

## 2015-12-18 NOTE — ED Notes (Signed)
MD at bedside. 

## 2015-12-19 ENCOUNTER — Observation Stay (HOSPITAL_COMMUNITY): Payer: Medicare Other

## 2015-12-19 ENCOUNTER — Observation Stay (HOSPITAL_COMMUNITY)
Admission: EM | Admit: 2015-12-19 | Discharge: 2015-12-21 | Disposition: A | Discharge status: Home / Self Care | Payer: Medicare Other | Attending: Internal Medicine | Admitting: Internal Medicine

## 2015-12-19 DIAGNOSIS — M4800 Spinal stenosis, site unspecified: Secondary | ICD-10-CM | POA: Diagnosis not present

## 2015-12-19 DIAGNOSIS — M545 Low back pain: Secondary | ICD-10-CM | POA: Diagnosis not present

## 2015-12-19 DIAGNOSIS — I4891 Unspecified atrial fibrillation: Secondary | ICD-10-CM | POA: Diagnosis not present

## 2015-12-19 DIAGNOSIS — F17210 Nicotine dependence, cigarettes, uncomplicated: Secondary | ICD-10-CM | POA: Diagnosis not present

## 2015-12-19 DIAGNOSIS — Z794 Long term (current) use of insulin: Secondary | ICD-10-CM | POA: Diagnosis not present

## 2015-12-19 DIAGNOSIS — J439 Emphysema, unspecified: Secondary | ICD-10-CM | POA: Diagnosis present

## 2015-12-19 DIAGNOSIS — E785 Hyperlipidemia, unspecified: Secondary | ICD-10-CM | POA: Diagnosis not present

## 2015-12-19 DIAGNOSIS — E876 Hypokalemia: Secondary | ICD-10-CM | POA: Insufficient documentation

## 2015-12-19 DIAGNOSIS — I2510 Atherosclerotic heart disease of native coronary artery without angina pectoris: Secondary | ICD-10-CM | POA: Insufficient documentation

## 2015-12-19 DIAGNOSIS — M4807 Spinal stenosis, lumbosacral region: Principal | ICD-10-CM | POA: Insufficient documentation

## 2015-12-19 DIAGNOSIS — N400 Enlarged prostate without lower urinary tract symptoms: Secondary | ICD-10-CM | POA: Insufficient documentation

## 2015-12-19 DIAGNOSIS — I10 Essential (primary) hypertension: Secondary | ICD-10-CM | POA: Insufficient documentation

## 2015-12-19 DIAGNOSIS — M5136 Other intervertebral disc degeneration, lumbar region: Secondary | ICD-10-CM | POA: Diagnosis not present

## 2015-12-19 DIAGNOSIS — Z7982 Long term (current) use of aspirin: Secondary | ICD-10-CM | POA: Insufficient documentation

## 2015-12-19 DIAGNOSIS — K5900 Constipation, unspecified: Secondary | ICD-10-CM | POA: Diagnosis not present

## 2015-12-19 DIAGNOSIS — G4733 Obstructive sleep apnea (adult) (pediatric): Secondary | ICD-10-CM | POA: Insufficient documentation

## 2015-12-19 DIAGNOSIS — R52 Pain, unspecified: Secondary | ICD-10-CM

## 2015-12-19 DIAGNOSIS — E669 Obesity, unspecified: Secondary | ICD-10-CM | POA: Insufficient documentation

## 2015-12-19 DIAGNOSIS — M549 Dorsalgia, unspecified: Secondary | ICD-10-CM | POA: Diagnosis not present

## 2015-12-19 DIAGNOSIS — J449 Chronic obstructive pulmonary disease, unspecified: Secondary | ICD-10-CM | POA: Diagnosis not present

## 2015-12-19 DIAGNOSIS — E119 Type 2 diabetes mellitus without complications: Secondary | ICD-10-CM | POA: Diagnosis not present

## 2015-12-19 DIAGNOSIS — M5417 Radiculopathy, lumbosacral region: Secondary | ICD-10-CM | POA: Diagnosis not present

## 2015-12-19 DIAGNOSIS — T8189XA Other complications of procedures, not elsewhere classified, initial encounter: Secondary | ICD-10-CM | POA: Diagnosis not present

## 2015-12-19 DIAGNOSIS — E118 Type 2 diabetes mellitus with unspecified complications: Secondary | ICD-10-CM

## 2015-12-19 DIAGNOSIS — T8131XA Disruption of external operation (surgical) wound, not elsewhere classified, initial encounter: Secondary | ICD-10-CM | POA: Diagnosis not present

## 2015-12-19 DIAGNOSIS — M79606 Pain in leg, unspecified: Secondary | ICD-10-CM

## 2015-12-19 DIAGNOSIS — R1030 Lower abdominal pain, unspecified: Secondary | ICD-10-CM

## 2015-12-19 LAB — CBC WITH DIFFERENTIAL/PLATELET
BASOS ABS: 0 10*3/uL (ref 0.0–0.1)
BASOS PCT: 0 %
EOS ABS: 0.2 10*3/uL (ref 0.0–0.7)
EOS PCT: 2 %
HCT: 50.1 % (ref 39.0–52.0)
Hemoglobin: 16.4 g/dL (ref 13.0–17.0)
Lymphocytes Relative: 21 %
Lymphs Abs: 1.7 10*3/uL (ref 0.7–4.0)
MCH: 29.4 pg (ref 26.0–34.0)
MCHC: 32.7 g/dL (ref 30.0–36.0)
MCV: 89.8 fL (ref 78.0–100.0)
MONO ABS: 0.8 10*3/uL (ref 0.1–1.0)
MONOS PCT: 9 %
Neutro Abs: 5.7 10*3/uL (ref 1.7–7.7)
Neutrophils Relative %: 68 %
PLATELETS: 222 10*3/uL (ref 150–400)
RBC: 5.58 MIL/uL (ref 4.22–5.81)
RDW: 14.5 % (ref 11.5–15.5)
WBC: 8.4 10*3/uL (ref 4.0–10.5)

## 2015-12-19 LAB — URINE MICROSCOPIC-ADD ON

## 2015-12-19 LAB — GLUCOSE, CAPILLARY
Glucose-Capillary: 107 mg/dL — ABNORMAL HIGH (ref 65–99)
Glucose-Capillary: 128 mg/dL — ABNORMAL HIGH (ref 65–99)

## 2015-12-19 LAB — BASIC METABOLIC PANEL
ANION GAP: 12 (ref 5–15)
BUN: 17 mg/dL (ref 6–20)
CALCIUM: 9.1 mg/dL (ref 8.9–10.3)
CO2: 30 mmol/L (ref 22–32)
CREATININE: 0.95 mg/dL (ref 0.61–1.24)
Chloride: 97 mmol/L — ABNORMAL LOW (ref 101–111)
GFR calc Af Amer: >60 mL/min (ref >60)
GLUCOSE: 133 mg/dL — AB (ref 65–99)
Potassium: 3.2 mmol/L — ABNORMAL LOW (ref 3.5–5.1)
Sodium: 139 mmol/L (ref 135–145)

## 2015-12-19 LAB — URINALYSIS, ROUTINE W REFLEX MICROSCOPIC
BILIRUBIN URINE: NEGATIVE
Glucose, UA: >1000 mg/dL — AB
Hgb urine dipstick: NEGATIVE
KETONES UR: NEGATIVE mg/dL
LEUKOCYTES UA: NEGATIVE
NITRITE: NEGATIVE
PROTEIN: NEGATIVE mg/dL
Specific Gravity, Urine: 1.014 (ref 1.005–1.030)
pH: 6.5 (ref 5.0–8.0)

## 2015-12-19 MED ORDER — ACETAMINOPHEN 325 MG PO TABS: 650.0000 mg | ORAL_TABLET | Freq: Four times a day (QID) | ORAL | Status: DC | PRN

## 2015-12-19 MED ORDER — SODIUM CHLORIDE 0.9% FLUSH
3.0000 mL | Freq: Two times a day (BID) | INTRAVENOUS | Status: DC
  Administered 2015-12-19: 10 mL via INTRAVENOUS
  Administered 2015-12-19 – 2015-12-21 (×4): 3 mL via INTRAVENOUS

## 2015-12-19 MED ORDER — LIDOCAINE HCL 1 % IJ SOLN
INTRAMUSCULAR | Status: AC
  Filled 2015-12-19: qty 20

## 2015-12-19 MED ORDER — HYDROMORPHONE HCL 1 MG/ML IJ SOLN
1.0000 mg | Freq: Once | INTRAMUSCULAR | Status: AC
  Administered 2015-12-19: 1 mg via INTRAVENOUS
  Filled 2015-12-19: qty 1

## 2015-12-19 MED ORDER — INSULIN ASPART 100 UNIT/ML ~~LOC~~ SOLN
0.0000 [IU] | Freq: Three times a day (TID) | SUBCUTANEOUS | Status: DC
  Administered 2015-12-20 – 2015-12-21 (×3): 2 [IU] via SUBCUTANEOUS

## 2015-12-19 MED ORDER — SODIUM CHLORIDE 0.9 % IV SOLN: 250.0000 mL | INTRAVENOUS | Status: DC | PRN

## 2015-12-19 MED ORDER — SODIUM CHLORIDE 0.9% FLUSH: 3.0000 mL | INTRAVENOUS | Status: DC | PRN

## 2015-12-19 MED ORDER — SODIUM CHLORIDE 0.9 % IJ SOLN
INTRAMUSCULAR | Status: AC
  Filled 2015-12-19: qty 10

## 2015-12-19 MED ORDER — ONDANSETRON HCL 4 MG/2ML IJ SOLN: 4.0000 mg | Freq: Four times a day (QID) | INTRAMUSCULAR | Status: DC | PRN

## 2015-12-19 MED ORDER — PRAVASTATIN SODIUM 40 MG PO TABS
40.0000 mg | ORAL_TABLET | Freq: Every evening | ORAL | Status: DC
  Administered 2015-12-19 – 2015-12-20 (×2): 40 mg via ORAL
  Filled 2015-12-19 (×2): qty 1

## 2015-12-19 MED ORDER — INSULIN ASPART 100 UNIT/ML ~~LOC~~ SOLN: 0.0000 [IU] | Freq: Every day | SUBCUTANEOUS | Status: DC

## 2015-12-19 MED ORDER — HYDROMORPHONE HCL 1 MG/ML IJ SOLN
1.0000 mg | INTRAMUSCULAR | Status: DC | PRN
  Administered 2015-12-19 – 2015-12-20 (×6): 1 mg via INTRAVENOUS
  Filled 2015-12-19 (×6): qty 1

## 2015-12-19 MED ORDER — ACETAMINOPHEN 650 MG RE SUPP: 650.0000 mg | Freq: Four times a day (QID) | RECTAL | Status: DC | PRN

## 2015-12-19 MED ORDER — SENNOSIDES-DOCUSATE SODIUM 8.6-50 MG PO TABS: 1.0000 | ORAL_TABLET | Freq: Every evening | ORAL | Status: DC | PRN

## 2015-12-19 MED ORDER — VERAPAMIL HCL ER 240 MG PO TBCR
240.0000 mg | EXTENDED_RELEASE_TABLET | Freq: Every day | ORAL | Status: DC
  Administered 2015-12-21: 240 mg via ORAL
  Filled 2015-12-19 (×2): qty 1

## 2015-12-19 MED ORDER — INSULIN GLARGINE 100 UNIT/ML ~~LOC~~ SOLN
5.0000 [IU] | Freq: Every day | SUBCUTANEOUS | Status: DC
  Administered 2015-12-20: 5 [IU] via SUBCUTANEOUS
  Filled 2015-12-19 (×4): qty 0.05

## 2015-12-19 MED ORDER — FUROSEMIDE 40 MG PO TABS: 40.0000 mg | ORAL_TABLET | Freq: Every day | ORAL | Status: DC | PRN

## 2015-12-19 MED ORDER — METHYLPREDNISOLONE ACETATE 40 MG/ML IJ SUSP
INTRAMUSCULAR | Status: AC
  Administered 2015-12-19: 40 mg
  Filled 2015-12-19: qty 1

## 2015-12-19 MED ORDER — ONDANSETRON HCL 4 MG PO TABS: 4.0000 mg | ORAL_TABLET | Freq: Four times a day (QID) | ORAL | Status: DC | PRN

## 2015-12-19 MED ORDER — POTASSIUM CHLORIDE CRYS ER 20 MEQ PO TBCR
40.0000 meq | EXTENDED_RELEASE_TABLET | Freq: Two times a day (BID) | ORAL | Status: AC
  Administered 2015-12-19 (×2): 40 meq via ORAL
  Filled 2015-12-19 (×3): qty 2

## 2015-12-19 MED ORDER — METHYLPREDNISOLONE ACETATE 80 MG/ML IJ SUSP
INTRAMUSCULAR | Status: AC
  Administered 2015-12-19: 80 mg
  Filled 2015-12-19: qty 1

## 2015-12-19 MED ORDER — DIAZEPAM 5 MG/ML IJ SOLN
2.5000 mg | Freq: Once | INTRAMUSCULAR | Status: AC
  Administered 2015-12-19: 2.5 mg via INTRAVENOUS
  Filled 2015-12-19: qty 2

## 2015-12-19 NOTE — ED Notes (Signed)
Repaged Dr. Sherwood Gambler x2

## 2015-12-19 NOTE — Consult Note (Signed)
Reason for Consult:  Back pain, bilateral lower extremity pain, lower abdominal and bilateral groin and testicular pain Referring Physician:  Ezzie Dural, MD (EDP)  Steve Charles is an 70 y.o. ambidextrous white male.  He has been a patient of mine since 1998, and I have known him personally for over 25 years. He is status post an L5 Gill procedure, a bilateral L5-S1 discectomy and PLIF with Ray cages in July 1998. One month later he return for a wound revision in August 1998. He subsequently required a right L4 hemilaminectomy and microdiscectomy in May 2000.  More recently he is status post a right carpal tunnel release and right ulnar nerve compression in June 2015 and a left carpal tunnel release in August 2015. I performed all of these surgeries. Most recently I evaluated the patient in the office on February 8th and 9th.   HPI :  Patient presented to the emergency room earlier this morning with continued complaints of low back pain and pain into his lower extremities. He is also describing discomfort and tenderness in the lower abdomen as well as into the groins and testicles (more so on the left than the right). His difficulties began about 12 days ago, and he contacted Dr. Kathyrn Sheriff over the weekend, who prescribed a steroid Dosepak. He then followed up with me in the office 1 week ago. We obtained an MRI of the lumbar spine without and with contrast the following morning, and we saw him back later that day. It showed extensive degenerative and postsurgical changes at the L4-5 and L5-S1 levels, but the overall appearance was not significantly changed from his most recent previous lumbar MRI from 09/06/2007. Patient was instructed to complete his steroid dose pack, and then start on diclofenac 75 mg twice a day. He presented to the emergency room yesterday, and then again this morning, and was admitted to the triad hospitalist service today.   I discussed this case this morning with Dr. Johnney Killian.   We discussed considering proceeding with a CT of the abdomen and pelvis, but in the end elected symptomatic treatment, and I recommended a left L3-4 translaminar epidural steroid injection by the neuro interventional radiology service. The injection was performed earlier today by Dr. Maryclare Bean. Patient feels that he has had some improvement from the injection today.  However he is now complaining of tenderness in the lower abdomen, as well as in the groins and proximal medial thighs bilaterally. He explains that he did see Dr. Rana Snare, his urologist at [facility] 2 days ago. The patient explains that an x-ray was done, as well as urinalysis, and Dr. Risa Grill  did not feel the patient has a kidney stone.   Patient was admitted to [facility] for further medical evaluation and pain management.  Past Medical History:  Past Medical History  Diagnosis Date  . DYSLIPIDEMIA 11/29/2009  . HYPERTENSION, ESSENTIAL 11/29/2009  . URTICARIA 02/06/2010  . BPH (benign prostatic hypertrophy)   . Osteoarthritis   . DM (diabetes mellitus) ([place])   . OSA (obstructive sleep apnea) 03/26/2011    CPAP 9 cm H2O  . COPD (chronic obstructive pulmonary disease) ([place]) 05/01/2011    PFT 05/01/11>>FEV1 2.07(72%), FEV1% 65, TLC 6.79(111%), DLCO 69%, no BD  . CAD (coronary artery disease)   . SVT (supraventricular tachycardia) ([facility])     Past Surgical History:  Past Surgical History  Procedure Laterality Date  . Appendectomy  1963  . Spine surgery      spinal fusion -  L4, L5  . Total knee arthroplasty  2007  . Transurethral resection of prostate      bph  . Nasal sinus surgery  1989  . Bilateral carpal tunnel release Bilateral     Family History:  Family History  Problem Relation Age of Onset  . Arthritis Other   . Diabetes Other   . Prostate cancer Brother   . Cancer Father     Social History:  reports that he has been smoking Cigarettes.  He has a 50 pack-year smoking history. He has never used smokeless  tobacco. He reports that he drinks alcohol. He reports that he does not use illicit drugs.  Allergies:  Allergies  Allergen Reactions  . Shellfish Allergy Hives  . Codeine Sulfate Hives  . Morphine And Related Hives    Oral morphine/ hives   . Propoxyphene N-Acetaminophen Hives    REACTION: hives  . Ramipril Hives    Medications: I have reviewed the patient's current medications.  ROS:  Notable for those difficulties described in his history of present illness and past medical history, but is otherwise unremarkable.   Physical Examination   Patient is obese white male, resting comfortably in bed.   Vital signs:  Blood pressure 119/68, pulse 89, temperature 98.7 F (37.1 C), temperature source Oral, resp. rate 17, SpO2 94 %. Abdomen:  Soft, nondistended, on initial examination some superficial tenderness to palpation in the lower abdomen, but no tenderness to deep palpation and no rebound. Extremities:  Mild to moderate edema. Venous stasis changes. Moderate clubbing. No cyanosis. Musculoskeletal: Negative straight leg raising bilaterally  Neurological Examination: Mental Status Examination:  Awake, alert, oriented, following commands, speech fluent  Motor Examination:  5/5 strength in iliopsoas and quadriceps bilaterally, as well as in the left dorsiflexor and EHL, as well as in the plantar flexor bilaterally. However the right dorsiflexor and EHL are 4 minus to 4/5.   Results for orders placed or performed during the hospital encounter of 12/19/15 (from the past 48 hour(s))  Basic metabolic panel     Status: Abnormal   Collection Time: 12/19/15  8:00 AM  Result Value Ref Range   Sodium 139 135 - 145 mmol/L   Potassium 3.2 (L) 3.5 - 5.1 mmol/L   Chloride 97 (L) 101 - 111 mmol/L   CO2 30 22 - 32 mmol/L   Glucose, Bld 133 (H) 65 - 99 mg/dL   BUN 17 6 - 20 mg/dL   Creatinine, Ser 0.95 0.61 - 1.24 mg/dL   Calcium 9.1 8.9 - 10.3 mg/dL   GFR calc non Af Amer >60 >60 mL/min    GFR calc Af Amer >60 >60 mL/min    Comment: (NOTE) The eGFR has been calculated using the CKD EPI equation. This calculation has not been validated in all clinical situations. eGFR's persistently <60 mL/min signify possible Chronic Kidney Disease.    Anion gap 12 5 - 15  CBC with Differential     Status: None   Collection Time: 12/19/15  8:00 AM  Result Value Ref Range   WBC 8.4 4.0 - 10.5 K/uL   RBC 5.58 4.22 - 5.81 MIL/uL   Hemoglobin 16.4 13.0 - 17.0 g/dL   HCT 50.1 39.0 - 52.0 %   MCV 89.8 78.0 - 100.0 fL   MCH 29.4 26.0 - 34.0 pg   MCHC 32.7 30.0 - 36.0 g/dL   RDW 14.5 11.5 - 15.5 %   Platelets 222 150 - 400 K/uL   Neutrophils Relative %  68 %   Neutro Abs 5.7 1.7 - 7.7 K/uL   Lymphocytes Relative 21 %   Lymphs Abs 1.7 0.7 - 4.0 K/uL   Monocytes Relative 9 %   Monocytes Absolute 0.8 0.1 - 1.0 K/uL   Eosinophils Relative 2 %   Eosinophils Absolute 0.2 0.0 - 0.7 K/uL   Basophils Relative 0 %   Basophils Absolute 0.0 0.0 - 0.1 K/uL  Urinalysis, Routine w reflex microscopic     Status: Abnormal   Collection Time: 12/19/15  8:09 AM  Result Value Ref Range   Color, Urine YELLOW YELLOW   APPearance CLEAR CLEAR   Specific Gravity, Urine 1.014 1.005 - 1.030   pH 6.5 5.0 - 8.0   Glucose, UA >1000 (A) NEGATIVE mg/dL   Hgb urine dipstick NEGATIVE NEGATIVE   Bilirubin Urine NEGATIVE NEGATIVE   Ketones, ur NEGATIVE NEGATIVE mg/dL   Protein, ur NEGATIVE NEGATIVE mg/dL   Nitrite NEGATIVE NEGATIVE   Leukocytes, UA NEGATIVE NEGATIVE  Urine microscopic-add on     Status: Abnormal   Collection Time: 12/19/15  8:09 AM  Result Value Ref Range   Squamous Epithelial / LPF 0-5 (A) NONE SEEN   WBC, UA 0-5 0 - 5 WBC/hpf   RBC / HPF 0-5 0 - 5 RBC/hpf   Bacteria, UA RARE (A) NONE SEEN  Glucose, capillary     Status: Abnormal   Collection Time: 12/19/15  5:00 PM  Result Value Ref Range   Glucose-Capillary 107 (H) 65 - 99 mg/dL   Comment 1 Notify RN    Comment 2 Document in Chart      Ir Inject Diag/thera/inc Needle/cath/plc Epi/lumb/sac W/img  12/19/2015  CLINICAL DATA:  Lumbosacral spondylosis without myelopathy. Left low back pain. FLUOROSCOPY TIME:  54 seconds PROCEDURE: LUMBAR EPIDURAL The procedure, risks, benefits, and alternatives were explained to the patient. Questions regarding the procedure were encouraged and answered. The patient understands and consents to the procedure. LUMBAR EPIDURAL INJECTION: An interlaminar approach was performed on the left at L3-4. The overlying skin was cleansed and anesthetized. A 20 gauge Crawford epidural needle was advanced using loss-of-resistance technique. DIAGNOSTIC EPIDURAL INJECTION: Injection of Omnipaque 180 shows a good epidural pattern with spread above and below the level of needle placement, primarily on the left. No vascular opacification is seen. THERAPEUTIC EPIDURAL INJECTION: 120 mg of Depo-Medrol mixed with 3 cc 1% lidocaine were instilled. The procedure was well-tolerated, and the patient was discharged thirty minutes following the injection in good condition. COMPLICATIONS: None IMPRESSION: Technically successful epidural injection on the left at L3-4. Electronically Signed   By: Marybelle Killings M.D.   On: 12/19/2015 15:22     Assessment: Patient with complaints of discomfort in the low back, extending into the lower extremities, but also complaints of tenderness in the lower abdomen and discomfort into the groins and testicles, worse on the left than the right.  MRI of his lumbar spine last week showed extensive degenerative and postsurgical changes at the L4-5 and L5-S1 levels, but is not significantly changed from an MRI over 8 years earlier. In the end it is difficult to know the etiology of this patient's symptoms. Tenderness, such as he describes in the lower abdomen and groins is atypical for symptoms arising from the spine. Pain and tenderness in the lower abdomen, groins, medial proximal thighs, and testicles would  be unusual for the lower lumbar levels, L4-5 and L5-S1, that a degenerated and postsurgical in this patient; rather there would be more typical  of symptoms in the dorsolumbar junction or upper lumbar region.  I've recommended to the patient that we evaluate his abdomen with a CT scan of the abdomen and pelvis to rule out a significant abdominal and/or pelvic process, and he's agreed to that. CT of the abdomen and pelvis has been ordered.  I spoke at the bedside with the patient and his daughters Apolonio Schneiders and Wells Guiles, and explained the difficulty in establishing a definitive etiology for his symptoms, that have evolved over the past week and a half, as has his exam, which today demonstrated weakness of the right dorsiflexor and EHL, that was not present when I examined him 1 week ago in the office. Their questions were answered regarding my assessment and recommendations. I will continue to follow the patient during this hospitalization, along with [facility].   Hosie Spangle, MD 12/19/2015, 6:51 PM

## 2015-12-19 NOTE — Progress Notes (Signed)
Paged Dr. Rita Ohara regarding patient's arrival. Spoke with MD's nurse [facility] and left message.   Verneta Hamidi, RN.

## 2015-12-19 NOTE — ED Provider Notes (Signed)
CSN: [id number]     Arrival date & time 12/19/15  [id number] History   First MD Initiated Contact with Patient 12/19/15 [phone]     Chief Complaint  Patient presents with  . Back Pain     (Consider location/radiation/quality/duration/timing/severity/associated sxs/prior Treatment) HPI Patient has had long-standing spinal stenosis. Symptoms have been worsening over the past 3 weeks. He did have MRI done approximately a week ago. He was seen in the emergency department yesterday and treated for pain. He reports that the pain medications that were prescribed by Dr. Sherwood Gambler and that he received yesterday are not telling his pain. He reports the pain was initially in the left leg and back but now is also moving into the right leg as well. He reports the pain is severe and limiting his ability to walk or do any activities. He denies urinary retention or incontinence. Past Medical History  Diagnosis Date  . DYSLIPIDEMIA 11/29/2009  . HYPERTENSION, ESSENTIAL 11/29/2009  . URTICARIA 02/06/2010  . BPH (benign prostatic hypertrophy)   . Osteoarthritis   . DM (diabetes mellitus) ([place])   . OSA (obstructive sleep apnea) 03/26/2011    CPAP 9 cm H2O  . COPD (chronic obstructive pulmonary disease) ([place]) 05/01/2011    PFT 05/01/11>>FEV1 2.07(72%), FEV1% 65, TLC 6.79(111%), DLCO 69%, no BD  . CAD (coronary artery disease)   . [facility]    Past Surgical History  Procedure Laterality Date  . Appendectomy  1963  . Spine surgery      spinal fusion - L4, L5  . Total knee arthroplasty  2007  . Transurethral resection of prostate      bph  . Nasal sinus surgery  1989  . Bilateral carpal tunnel release Bilateral    Family History  Problem Relation Age of Onset  . Arthritis Other   . Diabetes Other   . Prostate cancer Brother   . Cancer Father    Social History  Substance Use Topics  . Smoking status: Current Every Day Smoker -- 1.00 packs/day for 50 years    Types: Cigarettes   . Smokeless tobacco: Never Used     Comment: pt is again smoking < 1 PPD  . Alcohol Use: 0.0 oz/week    0 Standard drinks or equivalent per week     Comment: occasionallly    Review of Systems  10 Systems reviewed and are negative for acute change except as noted in the HPI.   Allergies  Shellfish allergy; Codeine sulfate; Morphine and related; Propoxyphene n-acetaminophen; and Ramipril  Home Medications   Prior to Admission medications   Medication Sig Start Date End Date Taking? Authorizing Provider  aspirin 81 MG tablet Take 81 mg by mouth every evening.     Historical Provider, MD  Canagliflozin (INVOKANA) 300 MG TABS Take 1 tablet by mouth daily.    Historical Provider, MD  CHROMIUM-CINNAMON PO Take 2,000 mg by mouth daily.    Historical Provider, MD  Cyanocobalamin (VITAMIN B 12 PO) Take 1 tablet by mouth daily.    Historical Provider, MD  diazepam (VALIUM) 5 MG tablet Take 1 tablet (5 mg total) by mouth every 8 (eight) hours as needed (dizziness). 12/18/15   Jola Schmidt, MD  diclofenac (VOLTAREN) 75 MG EC tablet Take 75 mg by mouth 2 (two) times daily. 12/13/15   Historical Provider, MD  furosemide (LASIX) 40 MG tablet Take 40 mg by mouth daily as needed (swelling).    Historical Provider, MD  glimepiride (AMARYL) 1  MG tablet Take 1 mg by mouth 2 (two) times daily. Reported on 11/21/2015 11/20/15   Historical Provider, MD  HYDROmorphone (DILAUDID) 2 MG tablet Take 2 mg by mouth every 6 (six) hours as needed. 12/11/15   Historical Provider, MD  LANTUS SOLOSTAR 100 UNIT/ML Solostar Pen Inject 10 Units into the skin daily at 10 pm.  11/23/14   Historical Provider, MD  metFORMIN (GLUCOPHAGE) 500 MG tablet Take 1,000 mg by mouth 2 (two) times daily with a meal. Take 2 tablets twice a day    Historical Provider, MD  oxyCODONE-acetaminophen (PERCOCET/ROXICET) 5-325 MG tablet Take 1 tablet by mouth every 4 (four) hours as needed for severe pain. 12/18/15   Jola Schmidt, MD  pravastatin  (PRAVACHOL) 40 MG tablet Take 1 tablet (40 mg total) by mouth every evening. 03/26/15   Lelon Perla, MD  Turmeric 450 MG CAPS Take 450 mg by mouth daily.    Historical Provider, MD  verapamil (CALAN-SR) 240 MG CR tablet TAKE ONE TABLET EACH DAY 10/15/15   Evans Lance, MD   BP 123/78 mmHg  Pulse 87  Temp(Src) 97.4 F (36.3 C) (Oral)  Resp 16  SpO2 97% Physical Exam  Constitutional: He is oriented to person, place, and time. He appears well-developed and well-nourished.  HENT:  Head: Normocephalic and atraumatic.  Eyes: EOM are normal. Pupils are equal, round, and reactive to light.  Neck: Neck supple.  Cardiovascular: Normal rate, regular rhythm, normal heart sounds and intact distal pulses.   Pulmonary/Chest: Effort normal and breath sounds normal.  Abdominal: Soft. Bowel sounds are normal. He exhibits no distension. There is no tenderness.  Musculoskeletal: Normal range of motion. He exhibits no edema.  Pain with forward flexion at the back. Endorses palpable pain over the sacrum and SI joints. Positive straight leg raise 45 bilateral.  Neurological: He is alert and oriented to person, place, and time. He has normal strength. No cranial nerve deficit. He exhibits normal muscle tone. GCS eye subscore is 4. GCS verbal subscore is 5. GCS motor subscore is 6.  Skin: Skin is warm, dry and intact.  Psychiatric: He has a normal mood and affect.    ED Course  Procedures (including critical care time) Labs Review Labs Reviewed  BASIC METABOLIC PANEL - Abnormal; Notable for the following:    Potassium 3.2 (*)    Chloride 97 (*)    Glucose, Bld 133 (*)    All other components within normal limits  CBC WITH DIFFERENTIAL/PLATELET  URINALYSIS, ROUTINE W REFLEX MICROSCOPIC (NOT AT [facility])    Imaging Review No results found. I have personally reviewed and evaluated these images and lab results as part of my medical decision-making.   EKG Interpretation None      Consult: Dr.  Sherwood Gambler (08:40) Admit patient for intractable pain control. Consult radiology for trans-laminar epidural steroid injections at L3-4 on the left side to see if additional pain control can be accomplished.  MDM   Final diagnoses:  Intractable pain  Spinal stenosis of lumbosacral region  Lumbosacral radiculopathy   Patient has failed outpatient treatment for pain control. At this time he will be admitted for pain control and consultation with neurosurgery with planned epidural steroid injection. Patient is otherwise alert and nontoxic. Vital signs are stable. He has progressed from being a left radiculopathy to bilateral.    Charlesetta Shanks, MD 12/19/15 [phone]

## 2015-12-19 NOTE — ED Notes (Signed)
Per ems- pt here for lower back pain, was seen here yesterday for same. Is going down both legs. Is able to stand and ambulate. Has called Dr. Sherwood Gambler and hasn;t received call back. BP 132/72, HR 88, 98% RA, CBG 116

## 2015-12-19 NOTE — H&P (Signed)
[facility] History and Physical                                                                                    Steve Charles, is a 70 y.o. male  MRN: [id number]   DOB - 1946-03-06  Admit Date - 12/19/2015  Outpatient Primary MD for the patient is [name], MD  With History of -  Past Medical History  Diagnosis Date  . DYSLIPIDEMIA 11/29/2009  . HYPERTENSION, ESSENTIAL 11/29/2009  . URTICARIA 02/06/2010  . BPH (benign prostatic hypertrophy)   . Osteoarthritis   . DM (diabetes mellitus) ([place])   . OSA (obstructive sleep apnea) 03/26/2011    CPAP 9 cm H2O  . COPD (chronic obstructive pulmonary disease) ([place]) 05/01/2011    PFT 05/01/11>>FEV1 2.07(72%), FEV1% 65, TLC 6.79(111%), DLCO 69%, no BD  . CAD (coronary artery disease)   . SVT (supraventricular tachycardia) [facility])       Past Surgical History  Procedure Laterality Date  . Appendectomy  1963  . Spine surgery      spinal fusion - L4, L5  . Total knee arthroplasty  2007  . Transurethral resection of prostate      bph  . Nasal sinus surgery  1989  . Bilateral carpal tunnel release Bilateral     in for   Chief Complaint  Patient presents with  . Back Pain     HPI  Steve Charles  is a 70 y.o. male with pmh of CAD, COPD, DM, SVT and postop afib presents to ED today with significant back pain. According to pt, he was seen twice last week in the office by Dr. Sherwood Gambler for back pain. Pt stated pain was in mid back radiating to lower back with numbness and tingling in left leg. Outpt MRI was done last week (results unavailable to me), but reportedly showed spinal stenosis. Pt was prescribed narcotic pain medication and NSAIDs. He came to ED on 2/14 with pain and oxycodone and valium were added to medications and pt d/c'd home.   Presents today with continued, intractable back pain in upper and lower back and numbness and tingling bilateral lower extremities. He electively saw urology this week and was ruled out  for kidney stone. He denies recent chest pain, sob, headache, dizziness, abdominal pain, n/v/d. Pt will be admitted for pain control. Dr Sherwood Gambler has recommended consult to IR for epidural steroid injection.   Review of Systems   In addition to the HPI above,  No Fever-chills, No Headache, No changes with Vision or hearing, No problems swallowing food or Liquids, No Chest pain, Cough or Shortness of Breath, No Abdominal pain, No Nausea or Vomiting, Bowel movements are regular, No Blood in stool or Urine, No dysuria, No new skin rashes or bruises, No new joint pains-aches,  No new weakness, tingling, numbness in any extremity, No recent weight gain or loss, A full 10 point Review of Systems was done, except as stated above, all other Review of Systems were negative.  Social History Married. Lives independently with wife. 1PPD x 50 years. Occasion etoh.   Family History Family History  Problem Relation Age of Onset  .  Arthritis Other   . Diabetes Other   . Prostate cancer Brother   . Cancer Father     Prior to Admission medications   Medication Sig Start Date End Date Taking? Authorizing Provider  aspirin 81 MG tablet Take 81 mg by mouth every evening.    Yes Historical Provider, MD  Canagliflozin (INVOKANA) 300 MG TABS Take 1 tablet by mouth daily.   Yes Historical Provider, MD  CHROMIUM-CINNAMON PO Take 2,000 mg by mouth daily.   Yes Historical Provider, MD  Cyanocobalamin (VITAMIN B 12 PO) Take 1 tablet by mouth daily.   Yes Historical Provider, MD  diazepam (VALIUM) 5 MG tablet Take 1 tablet (5 mg total) by mouth every 8 (eight) hours as needed (dizziness). 12/18/15  Yes Jola Schmidt, MD  diclofenac (VOLTAREN) 75 MG EC tablet Take 75 mg by mouth 2 (two) times daily. 12/13/15  Yes Historical Provider, MD  furosemide (LASIX) 40 MG tablet Take 40 mg by mouth daily as needed (swelling).   Yes Historical Provider, MD  glimepiride (AMARYL) 1 MG tablet Take 1 mg by mouth 2 (two) times  daily. Reported on 11/21/2015 11/20/15  Yes Historical Provider, MD  HYDROmorphone (DILAUDID) 2 MG tablet Take 2 mg by mouth every 6 (six) hours as needed. 12/11/15  Yes Historical Provider, MD  LANTUS SOLOSTAR 100 UNIT/ML Solostar Pen Inject 10 Units into the skin daily at 10 pm.  11/23/14  Yes Historical Provider, MD  metFORMIN (GLUCOPHAGE) 500 MG tablet Take 1,000 mg by mouth 2 (two) times daily with a meal. Take 2 tablets twice a day   Yes Historical Provider, MD  oxyCODONE-acetaminophen (PERCOCET/ROXICET) 5-325 MG tablet Take 1 tablet by mouth every 4 (four) hours as needed for severe pain. 12/18/15  Yes Jola Schmidt, MD  pravastatin (PRAVACHOL) 40 MG tablet Take 1 tablet (40 mg total) by mouth every evening. 03/26/15  Yes Lelon Perla, MD  Turmeric 450 MG CAPS Take 450 mg by mouth daily.   Yes Historical Provider, MD  verapamil (CALAN-SR) 240 MG CR tablet TAKE ONE TABLET EACH DAY 10/15/15  Yes Evans Lance, MD    Allergies  Allergen Reactions  . Shellfish Allergy Hives  . Codeine Sulfate Hives  . Morphine And Related Hives    Oral morphine/ hives   . Propoxyphene N-Acetaminophen Hives    REACTION: hives  . Ramipril Hives    Physical Exam  Vitals  Blood pressure 122/74, pulse 84, temperature 97.4 F (36.3 C), temperature source Oral, resp. rate 16, SpO2 98 %.   General:  lying in bed in NAD,   Psych:  Normal affect and insight, Not Suicidal or Homicidal, Awake Alert, Oriented X 3.  Neuro:   No F.N deficits, ALL C.Nerves Intact, Strength 5/5 all 4 extremities, Sensation intact all 4 extremities.  ENT:  Ears and Eyes appear Normal, Conjunctivae clear, PER. Moist oral mucosa without erythema or exudates.  Neck:  Supple, No lymphadenopathy appreciated  Respiratory:  Symmetrical chest wall movement, Good air movement bilaterally, CTAB.  Cardiac:  RRR, No Murmurs, no LE edema noted, no JVD.    Abdomen:  Positive bowel sounds, Soft, Non tender, Non distended,  No masses  appreciated  Skin:  No Cyanosis, Normal Skin Turgor, No Skin Rash or Bruise.  Extremities:  Able to move all 4. 5/5 strength in each,  no effusions.  Data Review  CBC  Recent Labs Lab 12/18/15 0900 12/19/15 0800  WBC 9.4 8.4  HGB 16.5 16.4  HCT 50.1  50.1  PLT 244 222  MCV 89.3 89.8  MCH 29.4 29.4  MCHC 32.9 32.7  RDW 14.4 14.5  LYMPHSABS  --  1.7  MONOABS  --  0.8  EOSABS  --  0.2  BASOSABS  --  0.0    Chemistries   Recent Labs Lab 12/18/15 0900 12/19/15 0800  NA 140 139  K 3.5 3.2*  CL 97* 97*  CO2 25 30  GLUCOSE 133* 133*  BUN 17 17  CREATININE 0.91 0.95  CALCIUM 9.4 9.1    estimated creatinine clearance is 86.4 mL/min (by C-G formula based on Cr of 0.95).  No results for input(s): TSH, T4TOTAL, T3FREE, THYROIDAB in the last 72 hours.  Invalid input(s): FREET3  Coagulation profile No results for input(s): INR, PROTIME in the last 168 hours.  No results for input(s): DDIMER in the last 72 hours.  Cardiac Enzymes No results for input(s): CKMB, TROPONINI, MYOGLOBIN in the last 168 hours.  Invalid input(s): CK  Invalid input(s): POCBNP  Urinalysis    Component Value Date/Time   COLORURINE YELLOW 12/19/2015 0809   APPEARANCEUR CLEAR 12/19/2015 0809   LABSPEC 1.014 12/19/2015 0809   PHURINE 6.5 12/19/2015 0809   GLUCOSEU >1000* 12/19/2015 0809   GLUCOSEU >=1000 09/11/2010 0829   HGBUR NEGATIVE 12/19/2015 0809   HGBUR negative 11/29/2009 1007   BILIRUBINUR NEGATIVE 12/19/2015 0809   BILIRUBINUR neg 02/01/2014 1100   KETONESUR NEGATIVE 12/19/2015 0809   PROTEINUR NEGATIVE 12/19/2015 0809   PROTEINUR neg 02/01/2014 1100   UROBILINOGEN 0.2 02/01/2014 1100   UROBILINOGEN 0.2 09/11/2010 0829   NITRITE NEGATIVE 12/19/2015 0809   NITRITE neg 02/01/2014 1100   LEUKOCYTESUR NEGATIVE 12/19/2015 0809    Imaging results:   No results found.     Assessment & Plan  Active Problems:   Dyslipidemia   Essential hypertension   BPH (benign  prostatic hyperplasia)   COPD with emphysema (HCC)   CAD (coronary artery disease)   OSA (obstructive sleep apnea)   Obesity (BMI 30-39.9)   Type 2 diabetes mellitus, controlled ([place])   Spinal stenosis  1. Intractable back pain in setting of spinal stenosis -MRI done outpt last week. Will ask for copies of this to determine need for further imaging. U/a unremarkable. -IR consult per NS recommendations. Sherwood Gambler will see -Pain management.   2. Hypokalemia, mild -on Lasix bid at home.  -replete po  3. DM -Hold oral agents while inpt. -lantus, SSI  4. Hyperlipidemia -continue statin  5. Essential htn -well controlled -continue home meds.   6. Hx CAD -follows closely outpt with Dr. Stanford Breed (last seen 12/07/15) -continue home meds holding asa for now with procedure, but will need to resume   DVT Prophylaxis: SCDs  AM Labs Ordered, also please review Full Orders  Family Communication:  Daughter at bedside on exam  Code Status:  Full code  Condition:  stable  Time spent in minutes : [place], NP on 12/19/2015 at 9:49 AM Between 7am to [place] - [phone] After 7pm go to [email or web address] - password [email or web address]  And look for the night coverage person covering me after hours  [facility]

## 2015-12-19 NOTE — Procedures (Signed)
L L3/4 epidural corticosteroid inj  120 mg depo 3 cc 1 % lido  No comp/EBL

## 2015-12-19 NOTE — Progress Notes (Signed)
Patient arrived to 5M07 from ED at this time accompanied by family. Safety precautions and orders reviewed with patient/family. SCDs ordered. Pt refused yellow(high fall risk) socks at this time. C/o back pain. Medication give. IR called for possible epidural injection. Reviewed plan of care and goal today again. Will continue to monitor.   Ave Filter, RN

## 2015-12-20 ENCOUNTER — Observation Stay (HOSPITAL_COMMUNITY): Payer: Medicare Other

## 2015-12-20 ENCOUNTER — Encounter (HOSPITAL_COMMUNITY): Payer: Self-pay | Admitting: Radiology

## 2015-12-20 DIAGNOSIS — M545 Low back pain: Secondary | ICD-10-CM | POA: Diagnosis not present

## 2015-12-20 DIAGNOSIS — N400 Enlarged prostate without lower urinary tract symptoms: Secondary | ICD-10-CM

## 2015-12-20 DIAGNOSIS — M549 Dorsalgia, unspecified: Secondary | ICD-10-CM

## 2015-12-20 DIAGNOSIS — K8020 Calculus of gallbladder without cholecystitis without obstruction: Secondary | ICD-10-CM | POA: Diagnosis not present

## 2015-12-20 DIAGNOSIS — I10 Essential (primary) hypertension: Secondary | ICD-10-CM | POA: Diagnosis not present

## 2015-12-20 DIAGNOSIS — E785 Hyperlipidemia, unspecified: Secondary | ICD-10-CM

## 2015-12-20 DIAGNOSIS — M4807 Spinal stenosis, lumbosacral region: Secondary | ICD-10-CM | POA: Diagnosis not present

## 2015-12-20 LAB — CBC
HEMATOCRIT: 50.7 % (ref 39.0–52.0)
HEMOGLOBIN: 16.7 g/dL (ref 13.0–17.0)
MCH: 30.3 pg (ref 26.0–34.0)
MCHC: 32.9 g/dL (ref 30.0–36.0)
MCV: 91.8 fL (ref 78.0–100.0)
Platelets: 231 10*3/uL (ref 150–400)
RBC: 5.52 MIL/uL (ref 4.22–5.81)
RDW: 14.6 % (ref 11.5–15.5)
WBC: 12.9 10*3/uL — ABNORMAL HIGH (ref 4.0–10.5)

## 2015-12-20 LAB — GLUCOSE, CAPILLARY
GLUCOSE-CAPILLARY: 138 mg/dL — AB (ref 65–99)
Glucose-Capillary: 101 mg/dL — ABNORMAL HIGH (ref 65–99)
Glucose-Capillary: 146 mg/dL — ABNORMAL HIGH (ref 65–99)
Glucose-Capillary: 125 mg/dL — ABNORMAL HIGH (ref 65–99)

## 2015-12-20 LAB — BASIC METABOLIC PANEL
Anion gap: 11 (ref 5–15)
BUN: 18 mg/dL (ref 6–20)
CHLORIDE: 99 mmol/L — AB (ref 101–111)
CO2: 29 mmol/L (ref 22–32)
CREATININE: 0.88 mg/dL (ref 0.61–1.24)
Calcium: 9.1 mg/dL (ref 8.9–10.3)
GFR calc Af Amer: >60 mL/min (ref >60)
GFR calc non Af Amer: >60 mL/min (ref >60)
GLUCOSE: 161 mg/dL — AB (ref 65–99)
Potassium: 4.2 mmol/L (ref 3.5–5.1)
Sodium: 139 mmol/L (ref 135–145)

## 2015-12-20 MED ORDER — FLEET ENEMA 7-19 GM/118ML RE ENEM: 1.0000 | ENEMA | Freq: Every day | RECTAL | Status: DC | PRN

## 2015-12-20 MED ORDER — POLYETHYLENE GLYCOL 3350 17 G PO PACK: 17.0000 g | PACK | Freq: Every day | ORAL | Status: DC | PRN

## 2015-12-20 MED ORDER — IOHEXOL 300 MG/ML  SOLN
100.0000 mL | Freq: Once | INTRAMUSCULAR | Status: AC | PRN
  Administered 2015-12-20: 100 mL via INTRAVENOUS

## 2015-12-20 MED ORDER — SORBITOL 70 % SOLN
960.0000 mL | TOPICAL_OIL | Freq: Once | ORAL | Status: AC
  Administered 2015-12-20: 960 mL via RECTAL
  Filled 2015-12-20: qty 240

## 2015-12-20 MED ORDER — HYDROMORPHONE HCL 2 MG PO TABS
2.0000 mg | ORAL_TABLET | ORAL | Status: DC | PRN
  Administered 2015-12-20 (×2): 2 mg via ORAL
  Administered 2015-12-21 (×2): 4 mg via ORAL
  Filled 2015-12-20 (×2): qty 1
  Filled 2015-12-20: qty 2
  Filled 2015-12-20 (×2): qty 1

## 2015-12-20 MED ORDER — IOHEXOL 300 MG/ML  SOLN
25.0000 mL | INTRAMUSCULAR | Status: AC
Start: 2015-12-20 — End: 2015-12-20
  Administered 2015-12-20 (×2): 25 mL via ORAL

## 2015-12-20 NOTE — Progress Notes (Signed)
Filed Vitals:   12/19/15 2210 12/20/15 0515 12/20/15 0803 12/20/15 1358  BP: 121/62 115/76 140/75 149/81  Pulse: 99 111 108 99  Temp: 98.1 F (36.7 C) 97.8 F (36.6 C)  98.2 F (36.8 C)  TempSrc: Oral Oral  Oral  Resp: 17 18 18 20   SpO2: 92% 98% 95% 99%    CBC  Recent Labs  12/19/15 0800 12/20/15 0703  WBC 8.4 12.9*  HGB 16.4 16.7  HCT 50.1 50.7  PLT 222 231   BMET  Recent Labs  12/19/15 0800 12/20/15 0703  NA 139 139  K 3.2* 4.2  CL 97* 99*  CO2 30 29  GLUCOSE 133* 161*  BUN 17 18  CREATININE 0.95 0.88  CALCIUM 9.1 9.1    Patient much more comfortable today. Given a laxative, and had several large bowel movements. Abdominal discomfort resolved. Still some back and leg pain, but not as bad as it had been. Was able to get up to the bathroom. Has not yet ambulated in the halls. Still receiving Dilaudid IV when necessary.  CT of the abdomen showed some cholelithiasis, but no acute processes.  Plan: We'll order Dilaudid by mouth, and patient will try to use that rather than IV analgesic. Encourage patient to begin to ambulate short distances in the halls this evening and again tomorrow morning.  Hosie Spangle, MD 12/20/2015, 5:10 PM

## 2015-12-20 NOTE — Care Management Note (Signed)
Case Management Note  Patient Details  Name: Steve Charles MRN: [id number] Date of Birth: 1946/05/09  Subjective/Objective:    Patient admitted with back pain and underwent an epidural injection with radiology. Patient is from home with his wife.                 Action/Plan: CM following for discharge disposition.   Expected Discharge Date:                  Expected Discharge Plan:  Home/Self Care  In-House Referral:     Discharge planning Services     Post Acute Care Choice:    Choice offered to:     DME Arranged:    DME Agency:     HH Arranged:    HH Agency:     Status of Service:  In process, will continue to follow  Medicare Important Message Given:    Date Medicare IM Given:    Medicare IM give by:    Date Additional Medicare IM Given:    Additional Medicare Important Message give by:     If discussed at [place] of Stay Meetings, dates discussed:    Additional Comments:  Pollie Friar, RN 12/20/2015, 1:58 PM

## 2015-12-20 NOTE — Progress Notes (Signed)
[facility] PROGRESS NOTE  Steve Charles [id number] DOB: 03-28-1946 DOA: 12/19/2015 PCP: [name], MD  HPI/Brief narrative See admit h and p from 2/15 for details. Briefly, 70 y.o. male with pmh of CAD, COPD, DM, SVT and postop afib presented to the ED with significant back pain in the setting of known lumbar disease followed by Neurosurgery. Patient was admitted for further work up.  Assessment/Plan: 1. Intractable back pain in setting of spinal stenosis -MRI was done outpt one week prior to admission. U/a was unremarkable. -Neurosurgery was consulted. Input noted. Essentially, doubt chronic lumbar disease was the culprit given unchanged appearance on serial imaging. Abd CT was done. See below -Pain management.   2. Hypokalemia, mild -on Lasix bid at home.  -replaced  3. DM -Hold oral agents while inpt. -lantus, SSI  4. Hyperlipidemia -continue statin  5. Essential htn -well controlled -continue home meds.   6. Hx CAD -follows closely outpt with Dr. Stanford Breed (last seen 12/07/15) -continue home meds holding asa for now with procedure, but will need to resume  7. Constipation - Large amount of stool seen throughout the colon per my own read - Patient improved after multiple large bowel movements s/p SMOG enema - Continue cathartics as tolerated, especially while on narcotics - Suspect large portion of sx related to constipation  Code Status: Full Family Communication: Pt in room, family at bedside Disposition Plan: possible d/c in 24hrs   Consultants:  Neurosurgery  Procedures:    Antibiotics: Anti-infectives    None      HPI/Subjective: Complains of continued abd and low back pain  Objective: Filed Vitals:   12/19/15 2210 12/20/15 0515 12/20/15 0803 12/20/15 1358  BP: 121/62 115/76 140/75 149/81  Pulse: 99 111 108 99  Temp: 98.1 F (36.7 C) 97.8 F (36.6 C)  98.2 F (36.8 C)  TempSrc: Oral Oral  Oral  Resp: 17 18 18 20   SpO2:  92% 98% 95% 99%    Intake/Output Summary (Last 24 hours) at 12/20/15 1807 Last data filed at 12/19/15 1830  Gross per 24 hour  Intake    360 ml  Output      0 ml  Net    360 ml   There were no vitals filed for this visit.  Exam:   General:  Awake, in nad  Cardiovascular: regular, s1, s2  Respiratory: normal resp effort, no wheezing  Abdomen: soft, nondistended, pos BS  Musculoskeletal: perfused, no clubbing   Data Reviewed: Basic Metabolic Panel:  Recent Labs Lab 12/18/15 0900 12/19/15 0800 12/20/15 0703  NA 140 139 139  K 3.5 3.2* 4.2  CL 97* 97* 99*  CO2 25 30 29   GLUCOSE 133* 133* 161*  BUN 17 17 18   CREATININE 0.91 0.95 0.88  CALCIUM 9.4 9.1 9.1   Liver Function Tests: No results for input(s): AST, ALT, ALKPHOS, BILITOT, PROT, ALBUMIN in the last 168 hours. No results for input(s): LIPASE, AMYLASE in the last 168 hours. No results for input(s): AMMONIA in the last 168 hours. CBC:  Recent Labs Lab 12/18/15 0900 12/19/15 0800 12/20/15 0703  WBC 9.4 8.4 12.9*  NEUTROABS  --  5.7  --   HGB 16.5 16.4 16.7  HCT 50.1 50.1 50.7  MCV 89.3 89.8 91.8  PLT 244 222 231   Cardiac Enzymes: No results for input(s): CKTOTAL, CKMB, CKMBINDEX, TROPONINI in the last 168 hours. BNP (last 3 results) No results for input(s): BNP in the last 8760 hours.  ProBNP (last 3  results) No results for input(s): PROBNP in the last 8760 hours.  CBG:  Recent Labs Lab 12/19/15 1700 12/19/15 2154 12/20/15 0619 12/20/15 1213  GLUCAP 107* 128* 101* 146*    No results found for this or any previous visit (from the past 240 hour(s)).   Studies: Ct Abdomen Pelvis W Contrast  12/20/2015  CLINICAL DATA:  Left-sided abdominal pain EXAM: CT ABDOMEN AND PELVIS WITH CONTRAST TECHNIQUE: Multidetector CT imaging of the abdomen and pelvis was performed using the standard protocol following bolus administration of intravenous contrast. CONTRAST:  175mL OMNIPAQUE IOHEXOL 300 MG/ML   SOLN COMPARISON:  None. FINDINGS: Lung bases are free of acute infiltrate or sizable effusion. The liver, spleen adrenal glands and pancreas are within normal limits. The gallbladder is well distended and demonstrates a single calcified gallstone. No complicating factors are seen. Kidneys demonstrate tiny hypodensities likely representing small cysts. No renal calculi or obstructive changes are seen. The appendix is not well visualized consistent with prior surgical history. Fecal material is noted throughout colon. No diverticular change is seen. The bladder is well distended. No pelvic mass lesion is noted. The osseous structures show postsurgical changes in the lower lumbar spine as well as degenerative change. No acute bony abnormality noted. IMPRESSION: Cholelithiasis without complicating factors. No acute abnormality noted. Electronically Signed   By: Inez Catalina M.D.   On: 12/20/2015 13:08   Ir Inject Diag/thera/inc Needle/cath/plc Epi/lumb/sac W/img  12/19/2015  CLINICAL DATA:  Lumbosacral spondylosis without myelopathy. Left low back pain. FLUOROSCOPY TIME:  54 seconds PROCEDURE: LUMBAR EPIDURAL The procedure, risks, benefits, and alternatives were explained to the patient. Questions regarding the procedure were encouraged and answered. The patient understands and consents to the procedure. LUMBAR EPIDURAL INJECTION: An interlaminar approach was performed on the left at L3-4. The overlying skin was cleansed and anesthetized. A 20 gauge Crawford epidural needle was advanced using loss-of-resistance technique. DIAGNOSTIC EPIDURAL INJECTION: Injection of Omnipaque 180 shows a good epidural pattern with spread above and below the level of needle placement, primarily on the left. No vascular opacification is seen. THERAPEUTIC EPIDURAL INJECTION: 120 mg of Depo-Medrol mixed with 3 cc 1% lidocaine were instilled. The procedure was well-tolerated, and the patient was discharged thirty minutes following the  injection in good condition. COMPLICATIONS: None IMPRESSION: Technically successful epidural injection on the left at L3-4. Electronically Signed   By: Marybelle Killings M.D.   On: 12/19/2015 15:22    Scheduled Meds: . insulin aspart  0-15 Units Subcutaneous TID WC  . insulin aspart  0-5 Units Subcutaneous QHS  . insulin glargine  5 Units Subcutaneous QHS  . pravastatin  40 mg Oral QPM  . sodium chloride flush  3 mL Intravenous Q12H  . verapamil  240 mg Oral Daily   Continuous Infusions:   Active Problems:   Dyslipidemia   Essential hypertension   BPH (benign prostatic hyperplasia)   COPD with emphysema (HCC)   CAD (coronary artery disease)   OSA (obstructive sleep apnea)   Obesity (BMI 30-39.9)   Type 2 diabetes mellitus, controlled ([place])   Spinal stenosis   Intractable back pain   Diabetes mellitus with complication ([place])    [facility], [facility] Pager [phone]. If 7PM-7AM, please contact night-coverage at [email or web address], password [facility] 12/20/2015, 6:07 PM  LOS: 1 day

## 2015-12-21 DIAGNOSIS — I10 Essential (primary) hypertension: Secondary | ICD-10-CM | POA: Diagnosis not present

## 2015-12-21 DIAGNOSIS — J438 Other emphysema: Secondary | ICD-10-CM

## 2015-12-21 DIAGNOSIS — M4807 Spinal stenosis, lumbosacral region: Secondary | ICD-10-CM | POA: Diagnosis not present

## 2015-12-21 DIAGNOSIS — M549 Dorsalgia, unspecified: Secondary | ICD-10-CM | POA: Diagnosis not present

## 2015-12-21 DIAGNOSIS — E785 Hyperlipidemia, unspecified: Secondary | ICD-10-CM | POA: Diagnosis not present

## 2015-12-21 LAB — GLUCOSE, CAPILLARY
GLUCOSE-CAPILLARY: 136 mg/dL — AB (ref 65–99)
Glucose-Capillary: 142 mg/dL — ABNORMAL HIGH (ref 65–99)

## 2015-12-21 MED ORDER — POLYETHYLENE GLYCOL 3350 17 G PO PACK: 17.0000 g | PACK | Freq: Every day | ORAL | Status: DC | PRN

## 2015-12-21 MED ORDER — SENNOSIDES-DOCUSATE SODIUM 8.6-50 MG PO TABS: 1.0000 | ORAL_TABLET | Freq: Every evening | ORAL | Status: DC | PRN

## 2015-12-21 NOTE — Care Management Note (Signed)
Case Management Note  Patient Details  Name: Steve Charles MRN: [id number] Date of Birth: [birth year]  Subjective/Objective:                    Action/Plan: Patient discharging home today with self care. No further needs per CM.   Expected Discharge Date:                  Expected Discharge Plan:  Home/Self Care  In-House Referral:     Discharge planning Services     Post Acute Care Choice:    Choice offered to:     DME Arranged:    DME Agency:     HH Arranged:    [place] Agency:     Status of Service:  Completed, signed off  Medicare Important Message Given:    Date Medicare IM Given:    Medicare IM give by:    Date Additional Medicare IM Given:    Additional Medicare Important Message give by:     If discussed at [place] of Stay Meetings, dates discussed:    Additional Comments:  Pollie Friar, RN 12/21/2015, 1:49 PM

## 2015-12-21 NOTE — Progress Notes (Signed)
Discharge orders received, Pt for discharge home today. IV d/c'd. D/c instructions and RX given with verbalized understanding. Family at bedside to assist patient with discharge. Staff bought pt downstairs via wheelchair.12/21/15 1525

## 2015-12-21 NOTE — Discharge Summary (Signed)
Physician Discharge Summary  Steve Charles [id number] DOB: July 13, 1946 DOA: 12/19/2015  PCP: [name], MD  Admit date: 12/19/2015 Discharge date: 12/21/2015  Time spent: 20 minutes  Recommendations for Outpatient Follow-up:  1. Follow up with PCP in 2-3 weeks 2. Follow up with Neurosurgery as scheduled   Discharge Diagnoses:  Active Problems:   Dyslipidemia   Essential hypertension   BPH (benign prostatic hyperplasia)   COPD with emphysema (HCC)   CAD (coronary artery disease)   OSA (obstructive sleep apnea)   Obesity (BMI 30-39.9)   Type 2 diabetes mellitus, controlled ([place])   Spinal stenosis   Intractable back pain   Diabetes mellitus with complication [facility])   Discharge Condition: Improved  Diet recommendation: Diabetic, heart healthy  There were no vitals filed for this visit.  History of present illness:  Please review admit h and p from 2/15 for details. Briefly, 70 y.o. male with pmh of CAD, COPD, DM, SVT and postop afib presented to the ED with significant back pain in the setting of known lumbar disease followed by Neurosurgery. Patient was admitted for further work up.  Hospital Course:  1. Intractable back pain in setting of spinal stenosis -MRI was done outpt one week prior to admission. U/a was unremarkable. -Neurosurgery was consulted. Input noted. Essentially, doubt chronic lumbar disease was the culprit given unchanged appearance on serial imaging. Abd CT was done. See below -Pain management.  -Discussed with Neurosurgery and patient is stable for discharge from NS perspective  2. Hypokalemia, mild -on Lasix bid at home.  -replaced  3. DM -Hold oral agents while inpt. -lantus, SSI  4. Hyperlipidemia -continue statin  5. Essential htn -well controlled -continue home meds.   6. Hx CAD -follows closely outpt with Dr. Stanford Breed (last seen 12/07/15) -continue home meds holding asa for now with procedure, but will need to resume  7.  Constipation - Large amount of stool seen throughout the colon per my own read - Patient improved after multiple large bowel movements s/p SMOG enema - Continue cathartics as tolerated, especially while on narcotics - Suspect large portion of sx related to constipation  Consultations:  Dr. Sherwood Gambler  Discharge Exam: Filed Vitals:   12/20/15 1700 12/20/15 2152 12/21/15 0550 12/21/15 0931  BP: 137/80 130/82 134/103 131/73  Pulse: 90 86 88 87  Temp: 98.2 F (36.8 C) 98 F (36.7 C) 98.4 F (36.9 C) 98.1 F (36.7 C)  TempSrc: Oral Oral Oral Oral  Resp: 20 18 18    SpO2: 98% 97% 95% 98%    General: awake, in nad Cardiovascular: regular, s1, s2 Respiratory: normal resp effort, no wheezing  Discharge Instructions      Discharge Instructions    Vital signs    Complete by:  As directed   Upon arrival to nursing area and as patient's condition warrants            Medication List    TAKE these medications        aspirin 81 MG tablet  Take 81 mg by mouth every evening.     CHROMIUM-CINNAMON PO  Take 2,000 mg by mouth daily.     diazepam 5 MG tablet  Commonly known as:  VALIUM  Take 1 tablet (5 mg total) by mouth every 8 (eight) hours as needed (dizziness).     diclofenac 75 MG EC tablet  Commonly known as:  VOLTAREN  Take 75 mg by mouth 2 (two) times daily.     furosemide 40 MG tablet  Commonly known as:  LASIX  Take 40 mg by mouth daily as needed (swelling).     glimepiride 1 MG tablet  Commonly known as:  AMARYL  Take 1 mg by mouth 2 (two) times daily. Reported on 11/21/2015     HYDROmorphone 2 MG tablet  Commonly known as:  DILAUDID  Take 2 mg by mouth every 6 (six) hours as needed.     INVOKANA 300 MG Tabs tablet  Generic drug:  canagliflozin  Take 1 tablet by mouth daily.     LANTUS SOLOSTAR 100 UNIT/ML Solostar Pen  Generic drug:  Insulin Glargine  Inject 10 Units into the skin daily at 10 pm.     metFORMIN 500 MG tablet  Commonly known as:   GLUCOPHAGE  Take 1,000 mg by mouth 2 (two) times daily with a meal. Take 2 tablets twice a day     oxyCODONE-acetaminophen 5-325 MG tablet  Commonly known as:  PERCOCET/ROXICET  Take 1 tablet by mouth every 4 (four) hours as needed for severe pain.     polyethylene glycol packet  Commonly known as:  MIRALAX / GLYCOLAX  Take 17 g by mouth daily as needed for moderate constipation.     pravastatin 40 MG tablet  Commonly known as:  PRAVACHOL  Take 1 tablet (40 mg total) by mouth every evening.     senna-docusate 8.6-50 MG tablet  Commonly known as:  Senokot-S  Take 1 tablet by mouth at bedtime as needed for mild constipation.     Turmeric 450 MG Caps  Take 450 mg by mouth daily.     verapamil 240 MG CR tablet  Commonly known as:  CALAN-SR  TAKE ONE TABLET EACH DAY     VITAMIN B 12 PO  Take 1 tablet by mouth daily.       Allergies  Allergen Reactions  . Shellfish Allergy Hives  . Codeine Sulfate Hives  . Morphine And Related Hives    Oral morphine/ hives   . Propoxyphene N-Acetaminophen Hives    REACTION: hives  . Ramipril Hives   Follow-up Information    Follow up with [name], MD. Schedule an appointment as soon as possible for a visit in 2 weeks.   Specialty:  Family Medicine   Contact information:   [name] [address] [phone]       Follow up with Hosie Spangle, MD.   Specialty:  Neurosurgery   Why:  as scheduled   Contact information:   [address] [place] [address] [phone]        The results of significant diagnostics from this hospitalization (including imaging, microbiology, ancillary and laboratory) are listed below for reference.    Significant Diagnostic Studies: Ct Abdomen Pelvis W Contrast  12/20/2015  CLINICAL DATA:  Left-sided abdominal pain EXAM: CT ABDOMEN AND PELVIS WITH CONTRAST TECHNIQUE: Multidetector CT imaging of the abdomen and pelvis was performed using the  standard protocol following bolus administration of intravenous contrast. CONTRAST:  152mL OMNIPAQUE IOHEXOL 300 MG/ML  SOLN COMPARISON:  None. FINDINGS: Lung bases are free of acute infiltrate or sizable effusion. The liver, spleen adrenal glands and pancreas are within normal limits. The gallbladder is well distended and demonstrates a single calcified gallstone. No complicating factors are seen. Kidneys demonstrate tiny hypodensities likely representing small cysts. No renal calculi or obstructive changes are seen. The appendix is not well visualized consistent with prior surgical history. Fecal material is noted throughout colon. No  diverticular change is seen. The bladder is well distended. No pelvic mass lesion is noted. The osseous structures show postsurgical changes in the lower lumbar spine as well as degenerative change. No acute bony abnormality noted. IMPRESSION: Cholelithiasis without complicating factors. No acute abnormality noted. Electronically Signed   By: Inez Catalina M.D.   On: 12/20/2015 13:08   Ir Inject Diag/thera/inc Needle/cath/plc Epi/lumb/sac W/img  12/19/2015  CLINICAL DATA:  Lumbosacral spondylosis without myelopathy. Left low back pain. FLUOROSCOPY TIME:  54 seconds PROCEDURE: LUMBAR EPIDURAL The procedure, risks, benefits, and alternatives were explained to the patient. Questions regarding the procedure were encouraged and answered. The patient understands and consents to the procedure. LUMBAR EPIDURAL INJECTION: An interlaminar approach was performed on the left at L3-4. The overlying skin was cleansed and anesthetized. A 20 gauge Crawford epidural needle was advanced using loss-of-resistance technique. DIAGNOSTIC EPIDURAL INJECTION: Injection of Omnipaque 180 shows a good epidural pattern with spread above and below the level of needle placement, primarily on the left. No vascular opacification is seen. THERAPEUTIC EPIDURAL INJECTION: 120 mg of Depo-Medrol mixed with 3 cc 1%  lidocaine were instilled. The procedure was well-tolerated, and the patient was discharged thirty minutes following the injection in good condition. COMPLICATIONS: None IMPRESSION: Technically successful epidural injection on the left at L3-4. Electronically Signed   By: Marybelle Killings M.D.   On: 12/19/2015 15:22    Microbiology: No results found for this or any previous visit (from the past 240 hour(s)).   Labs: Basic Metabolic Panel:  Recent Labs Lab 12/18/15 0900 12/19/15 0800 12/20/15 0703  NA 140 139 139  K 3.5 3.2* 4.2  CL 97* 97* 99*  CO2 25 30 29   GLUCOSE 133* 133* 161*  BUN 17 17 18   CREATININE 0.91 0.95 0.88  CALCIUM 9.4 9.1 9.1   Liver Function Tests: No results for input(s): AST, ALT, ALKPHOS, BILITOT, PROT, ALBUMIN in the last 168 hours. No results for input(s): LIPASE, AMYLASE in the last 168 hours. No results for input(s): AMMONIA in the last 168 hours. CBC:  Recent Labs Lab 12/18/15 0900 12/19/15 0800 12/20/15 0703  WBC 9.4 8.4 12.9*  NEUTROABS  --  5.7  --   HGB 16.5 16.4 16.7  HCT 50.1 50.1 50.7  MCV 89.3 89.8 91.8  PLT 244 222 231   Cardiac Enzymes: No results for input(s): CKTOTAL, CKMB, CKMBINDEX, TROPONINI in the last 168 hours. BNP: BNP (last 3 results) No results for input(s): BNP in the last 8760 hours.  ProBNP (last 3 results) No results for input(s): PROBNP in the last 8760 hours.  CBG:  Recent Labs Lab 12/20/15 1213 12/20/15 1637 12/20/15 2151 12/21/15 0550 12/21/15 1205  GLUCAP 146* 125* 138* 142* 136*    Signed:  CHIU, STEPHEN K  [facility] 12/21/2015, 12:37 PM

## 2015-12-28 DIAGNOSIS — Z6836 Body mass index (BMI) 36.0-36.9, adult: Secondary | ICD-10-CM | POA: Diagnosis not present

## 2015-12-28 DIAGNOSIS — M5136 Other intervertebral disc degeneration, lumbar region: Secondary | ICD-10-CM | POA: Diagnosis not present

## 2015-12-28 DIAGNOSIS — M4726 Other spondylosis with radiculopathy, lumbar region: Secondary | ICD-10-CM | POA: Diagnosis not present

## 2015-12-28 DIAGNOSIS — M5416 Radiculopathy, lumbar region: Secondary | ICD-10-CM | POA: Diagnosis not present

## 2015-12-28 DIAGNOSIS — M4806 Spinal stenosis, lumbar region: Secondary | ICD-10-CM | POA: Diagnosis not present

## 2015-12-28 DIAGNOSIS — M4316 Spondylolisthesis, lumbar region: Secondary | ICD-10-CM | POA: Diagnosis not present

## 2015-12-31 ENCOUNTER — Other Ambulatory Visit: Payer: Self-pay | Admitting: Neurosurgery

## 2015-12-31 DIAGNOSIS — M4316 Spondylolisthesis, lumbar region: Secondary | ICD-10-CM

## 2015-12-31 DIAGNOSIS — M5416 Radiculopathy, lumbar region: Secondary | ICD-10-CM | POA: Diagnosis not present

## 2015-12-31 DIAGNOSIS — M545 Low back pain: Secondary | ICD-10-CM | POA: Diagnosis not present

## 2016-01-03 ENCOUNTER — Ambulatory Visit (INDEPENDENT_AMBULATORY_CARE_PROVIDER_SITE_OTHER): Payer: Medicare Other | Admitting: Family Medicine

## 2016-01-03 ENCOUNTER — Encounter: Payer: Self-pay | Admitting: Family Medicine

## 2016-01-03 VITALS — BP 110/80 | HR 110 | Temp 95.0°F | Ht 68.0 in | Wt 236.9 lb

## 2016-01-03 DIAGNOSIS — M549 Dorsalgia, unspecified: Secondary | ICD-10-CM | POA: Diagnosis not present

## 2016-01-03 DIAGNOSIS — I2510 Atherosclerotic heart disease of native coronary artery without angina pectoris: Secondary | ICD-10-CM | POA: Diagnosis not present

## 2016-01-03 DIAGNOSIS — E1140 Type 2 diabetes mellitus with diabetic neuropathy, unspecified: Secondary | ICD-10-CM

## 2016-01-03 DIAGNOSIS — G4733 Obstructive sleep apnea (adult) (pediatric): Secondary | ICD-10-CM

## 2016-01-03 NOTE — Progress Notes (Signed)
Subjective:    Patient ID: Steve Charles, male    DOB: [birth year], 69 y.o.   MRN: [id number]  HPI   Patient seen for recent hospital follow-up.  He has a long history of lumbar stenosis and chronic back pain followed by neurosurgery.  He had been seen by his neurosurgeon about a week prior to admission with repeat MRI scan which did not show any major changes compared to prior imaging. Patient was placed on Dilaudid per neurosurgeon but had poor pain control stating that he had 10 out of 10 pain. He was admitted for further evaluation. CT abdomen was done which showed some constipation but no other acute findings.  Patient eventually had epidural left L3-4 which took his pain level from 10 to 4.  He is scheduled for repeat epidural in a couple weeks.  Patient also went to neurologist at [facility] for second opinion and they confirmed that he likely had some neuropathy related to L3-L4 nerve impingement. Patient was started gabapentin per neurosurgeon and plans to start that tonight with target dose of 100 mg 3 times daily. Current pain level remains level 4 and he is coping fairly well  Patient has type 2 diabetes which is been fairly well controlled. Followed by endocrinology for that.   Obstructive sleep apnea. Recent study confirmed moderate obstructive apnea. He's been intolerant of CPAP in the past.  Past Medical History  Diagnosis Date  . DYSLIPIDEMIA 11/29/2009  . HYPERTENSION, ESSENTIAL 11/29/2009  . URTICARIA 02/06/2010  . BPH (benign prostatic hypertrophy)   . Osteoarthritis   . DM (diabetes mellitus) ([place])   . OSA (obstructive sleep apnea) 03/26/2011    CPAP 9 cm H2O  . COPD (chronic obstructive pulmonary disease) ([place]) 05/01/2011    PFT 05/01/11>>FEV1 2.07(72%), FEV1% 65, TLC 6.79(111%), DLCO 69%, no BD  . CAD (coronary artery disease)   . SVT (supraventricular tachycardia) [facility])    Past Surgical History  Procedure Laterality Date  . Appendectomy  1963  . Spine  surgery      spinal fusion - L4, L5  . Total knee arthroplasty  2007  . Transurethral resection of prostate      bph  . Nasal sinus surgery  1989  . Bilateral carpal tunnel release Bilateral     reports that he has been smoking Cigarettes.  He has a 50 pack-year smoking history. He has never used smokeless tobacco. He reports that he drinks alcohol. He reports that he does not use illicit drugs. family history includes Arthritis in his other; Cancer in his father; Diabetes in his other; Prostate cancer in his brother. Allergies  Allergen Reactions  . Shellfish Allergy Hives  . Codeine Sulfate Hives  . Morphine And Related Hives    Oral morphine/ hives   . Propoxyphene N-Acetaminophen Hives    REACTION: hives  . Ramipril Hives     Review of Systems  Constitutional: Negative for fatigue.  Eyes: Negative for visual disturbance.  Respiratory: Negative for cough, chest tightness and shortness of breath.   Cardiovascular: Negative for chest pain, palpitations and leg swelling.  Gastrointestinal: Negative for abdominal pain.  Genitourinary: Negative for dysuria.  Musculoskeletal: Positive for back pain.  Neurological: Negative for dizziness, syncope, weakness, light-headedness and headaches.       Objective:   Physical Exam  Constitutional: He appears well-developed and well-nourished.  Cardiovascular: Normal rate and regular rhythm.   Pulmonary/Chest: Effort normal and breath sounds normal. No respiratory distress. He has no wheezes. He  has no rales.  Musculoskeletal:  Straight leg raises are negative.  Neurological:  He seems have some mild weakness with dorsiflexion right and left feet. No significant weakness with knee extension bilaterally. Normal sensory function to touch throughout          Assessment & Plan:    #1 history of chronic lumbar stenosis with recent intractable low back pain.  Currently improved following epidural. Continue close follow-up with  neurosurgery and he plans to start gabapentin tonight.   #2 obstructive sleep apnea. Recent repeat study done confirms moderate obstructive apnea. Patient has been intolerant of CPAP in the past. Set up pulmonary referral to go over potential options. He apparently tried CPAP in past without success. He has gradually lost some weight which he thinks is helping somewhat   #3 type 2 diabetes which has been fairly well controlled. He will continue follow-up with endocrinology for that

## 2016-01-03 NOTE — Progress Notes (Signed)
Pre visit review using our clinic review tool, if applicable. No additional management support is needed unless otherwise documented below in the visit note. 

## 2016-01-14 DIAGNOSIS — M7581 Other shoulder lesions, right shoulder: Secondary | ICD-10-CM | POA: Diagnosis not present

## 2016-01-15 ENCOUNTER — Ambulatory Visit
Admission: RE | Admit: 2016-01-15 | Discharge: 2016-01-15 | Disposition: A | Discharge status: Home / Self Care | Payer: Medicare Other | Source: Ambulatory Visit | Attending: Neurosurgery | Admitting: Neurosurgery

## 2016-01-15 ENCOUNTER — Other Ambulatory Visit: Payer: Medicare Other

## 2016-01-15 DIAGNOSIS — M5126 Other intervertebral disc displacement, lumbar region: Secondary | ICD-10-CM | POA: Diagnosis not present

## 2016-01-15 DIAGNOSIS — M4316 Spondylolisthesis, lumbar region: Secondary | ICD-10-CM

## 2016-01-15 MED ORDER — METHYLPREDNISOLONE ACETATE 40 MG/ML INJ SUSP (RADIOLOG
120.0000 mg | Freq: Once | INTRAMUSCULAR | Status: AC
  Administered 2016-01-15: 120 mg via EPIDURAL

## 2016-01-15 MED ORDER — IOHEXOL 180 MG/ML  SOLN
1.0000 mL | Freq: Once | INTRAMUSCULAR | Status: AC | PRN
  Administered 2016-01-15: 1 mL via EPIDURAL

## 2016-01-15 NOTE — Discharge Instructions (Signed)

## 2016-01-16 ENCOUNTER — Encounter (HOSPITAL_BASED_OUTPATIENT_CLINIC_OR_DEPARTMENT_OTHER): Payer: Medicare Other

## 2016-01-21 DIAGNOSIS — M7541 Impingement syndrome of right shoulder: Secondary | ICD-10-CM | POA: Diagnosis not present

## 2016-01-21 DIAGNOSIS — M25511 Pain in right shoulder: Secondary | ICD-10-CM | POA: Diagnosis not present

## 2016-01-21 DIAGNOSIS — S46011D Strain of muscle(s) and tendon(s) of the rotator cuff of right shoulder, subsequent encounter: Secondary | ICD-10-CM | POA: Diagnosis not present

## 2016-01-24 DIAGNOSIS — E119 Type 2 diabetes mellitus without complications: Secondary | ICD-10-CM | POA: Diagnosis not present

## 2016-01-24 DIAGNOSIS — M7541 Impingement syndrome of right shoulder: Secondary | ICD-10-CM | POA: Diagnosis not present

## 2016-01-24 DIAGNOSIS — M25511 Pain in right shoulder: Secondary | ICD-10-CM | POA: Diagnosis not present

## 2016-01-24 DIAGNOSIS — S46011D Strain of muscle(s) and tendon(s) of the rotator cuff of right shoulder, subsequent encounter: Secondary | ICD-10-CM | POA: Diagnosis not present

## 2016-01-24 LAB — HM DIABETES EYE EXAM

## 2016-01-29 ENCOUNTER — Other Ambulatory Visit: Payer: Self-pay | Admitting: Family Medicine

## 2016-01-31 DIAGNOSIS — M25511 Pain in right shoulder: Secondary | ICD-10-CM | POA: Diagnosis not present

## 2016-01-31 DIAGNOSIS — M7541 Impingement syndrome of right shoulder: Secondary | ICD-10-CM | POA: Diagnosis not present

## 2016-01-31 DIAGNOSIS — S46011D Strain of muscle(s) and tendon(s) of the rotator cuff of right shoulder, subsequent encounter: Secondary | ICD-10-CM | POA: Diagnosis not present

## 2016-02-01 ENCOUNTER — Encounter: Payer: Self-pay | Admitting: Family Medicine

## 2016-02-01 DIAGNOSIS — M4726 Other spondylosis with radiculopathy, lumbar region: Secondary | ICD-10-CM | POA: Diagnosis not present

## 2016-02-01 DIAGNOSIS — D751 Secondary polycythemia: Secondary | ICD-10-CM | POA: Diagnosis not present

## 2016-02-01 DIAGNOSIS — Z6837 Body mass index (BMI) 37.0-37.9, adult: Secondary | ICD-10-CM | POA: Diagnosis not present

## 2016-02-01 DIAGNOSIS — M5440 Lumbago with sciatica, unspecified side: Secondary | ICD-10-CM | POA: Diagnosis not present

## 2016-02-01 DIAGNOSIS — M4806 Spinal stenosis, lumbar region: Secondary | ICD-10-CM | POA: Diagnosis not present

## 2016-02-01 DIAGNOSIS — R609 Edema, unspecified: Secondary | ICD-10-CM | POA: Diagnosis not present

## 2016-02-01 DIAGNOSIS — E7800 Pure hypercholesterolemia, unspecified: Secondary | ICD-10-CM | POA: Diagnosis not present

## 2016-02-01 DIAGNOSIS — E1165 Type 2 diabetes mellitus with hyperglycemia: Secondary | ICD-10-CM | POA: Diagnosis not present

## 2016-02-01 DIAGNOSIS — M5136 Other intervertebral disc degeneration, lumbar region: Secondary | ICD-10-CM | POA: Diagnosis not present

## 2016-02-01 DIAGNOSIS — M5416 Radiculopathy, lumbar region: Secondary | ICD-10-CM | POA: Diagnosis not present

## 2016-02-01 LAB — CBC AND DIFFERENTIAL
HCT: 54 % — AB (ref 41–53)
Hemoglobin: 17.3 g/dL (ref 13.5–17.5)
Neutrophils Absolute: 62 /uL
Platelets: 213 10*3/uL (ref 150–399)
WBC: 9 10^3/mL

## 2016-02-01 LAB — BASIC METABOLIC PANEL
BUN: 21 mg/dL (ref 4–21)
CREATININE: 0.8 mg/dL (ref 0.6–1.3)
GLUCOSE: 144 mg/dL
POTASSIUM: 4.6 mmol/L (ref 3.4–5.3)
SODIUM: 139 mmol/L (ref 137–147)

## 2016-02-01 LAB — LIPID PANEL
Cholesterol: 202 mg/dL — AB (ref 0–200)
HDL: 47 mg/dL (ref 35–70)
LDL Cholesterol: 137 mg/dL
Triglycerides: 89 mg/dL (ref 40–160)

## 2016-02-01 LAB — TSH: TSH: 0.59 u[IU]/mL (ref 0.41–5.90)

## 2016-02-01 LAB — HEPATIC FUNCTION PANEL
ALT: 40 U/L (ref 10–40)
AST: 18 U/L (ref 14–40)
Alkaline Phosphatase: 89 U/L (ref 25–125)
BILIRUBIN DIRECT: 0.1 mg/dL (ref 0.01–0.4)
BILIRUBIN, TOTAL: 0.4 mg/dL

## 2016-02-07 DIAGNOSIS — R609 Edema, unspecified: Secondary | ICD-10-CM | POA: Diagnosis not present

## 2016-02-07 DIAGNOSIS — I10 Essential (primary) hypertension: Secondary | ICD-10-CM | POA: Diagnosis not present

## 2016-02-07 DIAGNOSIS — E7800 Pure hypercholesterolemia, unspecified: Secondary | ICD-10-CM | POA: Diagnosis not present

## 2016-02-07 DIAGNOSIS — E1165 Type 2 diabetes mellitus with hyperglycemia: Secondary | ICD-10-CM | POA: Diagnosis not present

## 2016-02-11 ENCOUNTER — Ambulatory Visit (INDEPENDENT_AMBULATORY_CARE_PROVIDER_SITE_OTHER)
Admission: RE | Admit: 2016-02-11 | Discharge: 2016-02-11 | Disposition: A | Discharge status: Home / Self Care | Payer: Medicare Other | Source: Ambulatory Visit | Attending: Pulmonary Disease | Admitting: Pulmonary Disease

## 2016-02-11 ENCOUNTER — Ambulatory Visit (INDEPENDENT_AMBULATORY_CARE_PROVIDER_SITE_OTHER): Payer: Medicare Other | Admitting: Pulmonary Disease

## 2016-02-11 ENCOUNTER — Encounter: Payer: Self-pay | Admitting: Pulmonary Disease

## 2016-02-11 VITALS — BP 118/68 | HR 96 | Ht 68.0 in | Wt 249.0 lb

## 2016-02-11 DIAGNOSIS — J439 Emphysema, unspecified: Secondary | ICD-10-CM | POA: Diagnosis not present

## 2016-02-11 DIAGNOSIS — J9811 Atelectasis: Secondary | ICD-10-CM | POA: Diagnosis not present

## 2016-02-11 DIAGNOSIS — G4733 Obstructive sleep apnea (adult) (pediatric): Secondary | ICD-10-CM | POA: Diagnosis not present

## 2016-02-11 DIAGNOSIS — I2510 Atherosclerotic heart disease of native coronary artery without angina pectoris: Secondary | ICD-10-CM | POA: Diagnosis not present

## 2016-02-11 DIAGNOSIS — R0600 Dyspnea, unspecified: Secondary | ICD-10-CM | POA: Diagnosis not present

## 2016-02-11 DIAGNOSIS — E669 Obesity, unspecified: Secondary | ICD-10-CM

## 2016-02-11 NOTE — Assessment & Plan Note (Addendum)
Not on MDIs.  SOB not too bad.  50PY, quit couple mos ago.  CXR today.

## 2016-02-11 NOTE — Patient Instructions (Signed)
1. Resume using her CPAP machine. 2. We will order you to have a mask fitting session to have the best mask fit. Give [place] a call if her having issues with that. 3. We will get a chest x-ray today.  Return to clinic in 6 weeks.

## 2016-02-11 NOTE — Progress Notes (Signed)
Subjective:    Patient ID: Steve Charles, male    DOB: April 19, 1946, 70 y.o.   MRN: [id number]  HPI  Pt is here for f/u on his OSA. Was seen by Dr. Halford Chessman in 2014. Has severe OSA.  Has lost 70 lbs x 3 yrs. Stopped using cpap x 2 yrs. Was uncomfortable with pressure and mask.  Back pain has flared up x 2 mos. Pt denies taking pain meds (opiates). Has gotten epidural for pain x 2. Has slept better since being on epidural. Takes Gabapentin.   Review of Systems  Constitutional: Negative.  Negative for fever and unexpected weight change.  HENT: Positive for congestion and sinus pressure. Negative for dental problem, ear pain, nosebleeds, rhinorrhea, sneezing, sore throat and trouble swallowing.   Eyes: Positive for itching. Negative for redness.  Respiratory: Negative.  Negative for cough, chest tightness, shortness of breath and wheezing.   Cardiovascular: Positive for palpitations. Negative for leg swelling.  Gastrointestinal: Negative.  Negative for nausea and vomiting.  Endocrine: Negative.   Genitourinary: Negative.  Negative for dysuria.  Musculoskeletal: Positive for arthralgias. Negative for joint swelling.  Skin: Negative.  Negative for rash.  Allergic/Immunologic: Positive for environmental allergies.  Neurological: Negative.  Negative for headaches.  Hematological: Bruises/bleeds easily.  Psychiatric/Behavioral: Negative.  Negative for dysphoric mood. The patient is not nervous/anxious.        Objective:   Physical Exam   Vitals:  Filed Vitals:   02/11/16 1035  BP: 118/68  Pulse: 96  Height: 5\' 8"  (1.727 m)  Weight: 249 lb (112.946 kg)  SpO2: 97%    Constitutional/General:  Pleasant, well-nourished, well-developed, not in any distress,  Comfortably seating.  Well kempt  Body mass index is 37.87 kg/(m^2). Wt Readings from Last 3 Encounters:  02/11/16 249 lb (112.946 kg)  01/03/16 236 lb 14.4 oz (107.457 kg)  12/07/15 238 lb 12.8 oz (108.319 kg)    Neck  circumference:   HEENT: Pupils equal and reactive to light and accommodation. Anicteric sclerae. Normal nasal mucosa.   No oral  lesions,  mouth clear,  oropharynx clear, no postnasal drip. (-) Oral thrush. No dental caries.  Airway - Mallampati class III  Neck: No masses. Midline trachea. No JVD, (-) LAD. (-) bruits appreciated.  Respiratory/Chest: Grossly normal chest. (-) deformity. (-) Accessory muscle use.  Symmetric expansion. (-) Tenderness on palpation.  Resonant on percussion.  Diminished BS on both lower lung zones. (-) wheezing, crackles, rhonchi (-) egophony  Cardiovascular: Regular rate and  rhythm, heart sounds normal, no murmur or gallops, no peripheral edema  Gastrointestinal:  Normal bowel sounds. Soft, non-tender. No hepatosplenomegaly.  (-) masses.   Musculoskeletal:  Normal muscle tone. Normal gait.   Extremities: Grossly normal. (-) clubbing, cyanosis.  (-) edema  Skin: (-) rash,lesions seen.   Neurological/Psychiatric : alert, oriented to time, place, person. Normal mood and affect           Assessment & Plan:  OSA (obstructive sleep apnea) Patient has moderate sleep apnea during a recent sleep study. PSG 03/26/11>>AHI 34.5, SpO2 low 78%. CPAP 9 cm>>AHI 8, +REM/supine PSG (January 2017) AHI 16. Adequate on CPAP of 7. No REM sleep. PLMSI 44.  Pt has lost 70lbs the last 3 yrs.  Historically he was noncompliant but wants to start using CPAP again.  Plan : 1. Continue cpap 7 cm H2O (based on last sleep study) 2. Will order supplies. We will have mask fitting session. He has issues with a lot of  straps on his face. May want Amara mask or nasal mask or nasal pillows. If he has nasal mask or pillows, he will need a chinstrap. 3. Will need a 1 month download. 4. He will need clearance with regards to sleep apnea for work. He works part-time as a Secondary school teacher. Download needs to show good compliance and correction of sleep apnea. Will need  clearance for [employer]  COPD with emphysema ([place]) Not on MDIs.  SOB not too bad.  50PY, quit couple mos ago.  CXR today.   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   Monica Becton, MD 02/11/2016, 2:59 PM [facility] Pager [phone] After 3 pm or if no answer, call [phone]

## 2016-02-11 NOTE — Assessment & Plan Note (Addendum)
Patient has moderate sleep apnea during a recent sleep study. PSG 03/26/11>>AHI 34.5, SpO2 low 78%. CPAP 9 cm>>AHI 8, +REM/supine PSG (January 2017) AHI 16. Adequate on CPAP of 7. No REM sleep. PLMSI 44.  Pt has lost 70lbs the last 3 yrs.  Historically he was noncompliant but wants to start using CPAP again.  Plan : 1. Continue cpap 7 cm H2O (based on last sleep study) 2. Will order supplies. We will have mask fitting session. He has issues with a lot of straps on his face. May want Amara mask or nasal mask or nasal pillows. If he has nasal mask or pillows, he will need a chinstrap. 3. Will need a 1 month download. 4. He will need clearance with regards to sleep apnea for work. He works part-time as a Secondary school teacher. Download needs to show good compliance and correction of sleep apnea. Will need clearance for [employer]

## 2016-02-11 NOTE — Assessment & Plan Note (Signed)
Weight reduction 

## 2016-02-12 ENCOUNTER — Other Ambulatory Visit: Payer: Self-pay | Admitting: Neurosurgery

## 2016-02-12 DIAGNOSIS — G8929 Other chronic pain: Secondary | ICD-10-CM

## 2016-02-12 DIAGNOSIS — M545 Low back pain: Principal | ICD-10-CM

## 2016-02-19 ENCOUNTER — Encounter: Payer: Self-pay | Admitting: Family Medicine

## 2016-02-19 ENCOUNTER — Ambulatory Visit (INDEPENDENT_AMBULATORY_CARE_PROVIDER_SITE_OTHER): Payer: Medicare Other | Admitting: Family Medicine

## 2016-02-19 VITALS — BP 100/70 | HR 103 | Temp 98.8°F | Ht 68.0 in | Wt 249.0 lb

## 2016-02-19 DIAGNOSIS — I2510 Atherosclerotic heart disease of native coronary artery without angina pectoris: Secondary | ICD-10-CM | POA: Diagnosis not present

## 2016-02-19 DIAGNOSIS — R21 Rash and other nonspecific skin eruption: Secondary | ICD-10-CM | POA: Diagnosis not present

## 2016-02-19 MED ORDER — CLOTRIMAZOLE-BETAMETHASONE 1-0.05 % EX CREA: 1.0000 "application " | TOPICAL_CREAM | Freq: Two times a day (BID) | CUTANEOUS | Status: DC

## 2016-02-19 NOTE — Patient Instructions (Signed)
Use the medicated cream twice daily Keep area as dry as possible Touch base in two weeks if no better.

## 2016-02-19 NOTE — Progress Notes (Signed)
   Subjective:    Patient ID: Steve Charles, male    DOB: [birth year], 70 y.o.   MRN: [id number]  HPI Acute visit for 2 month history of skin rash Rash is in the upper buttock region near the midline in the gluteal cleft region. He noticed pruritus several weeks ago and has been scratching this some. Applied Neosporin without relief. He has type 2 diabetes followed by endocrinology with this has been fairly well controlled.  Past Medical History  Diagnosis Date  . DYSLIPIDEMIA 11/29/2009  . HYPERTENSION, ESSENTIAL 11/29/2009  . URTICARIA 02/06/2010  . BPH (benign prostatic hypertrophy)   . Osteoarthritis   . DM (diabetes mellitus) ([place])   . OSA (obstructive sleep apnea) 03/26/2011    CPAP 9 cm H2O  . COPD (chronic obstructive pulmonary disease) ([place]) 05/01/2011    PFT 05/01/11>>FEV1 2.07(72%), FEV1% 65, TLC 6.79(111%), DLCO 69%, no BD  . CAD (coronary artery disease)   . SVT (supraventricular tachycardia) [facility])    Past Surgical History  Procedure Laterality Date  . Appendectomy  1963  . Spine surgery      spinal fusion - L4, L5  . Total knee arthroplasty  2007  . Transurethral resection of prostate      bph  . Nasal sinus surgery  1989  . Bilateral carpal tunnel release Bilateral     reports that he has been smoking Cigarettes.  He has a 50 pack-year smoking history. He has never used smokeless tobacco. He reports that he drinks alcohol. He reports that he does not use illicit drugs. family history includes Arthritis in his other; Cancer in his father; Diabetes in his other; Prostate cancer in his brother. Allergies  Allergen Reactions  . Codeine Sulfate Hives  . Morphine And Related Hives    Oral morphine/ hives   . Propoxyphene N-Acetaminophen Hives  . Ramipril Hives  . Shellfish Allergy Hives      Review of Systems  Constitutional: Negative for fever and chills.  Skin: Positive for rash.       Objective:   Physical Exam  Constitutional: He appears  well-developed and well-nourished.  Cardiovascular: Normal rate and regular rhythm.   Pulmonary/Chest: Effort normal and breath sounds normal.  Skin: Rash noted.  Patient has rash over gluteal region to the midline. Very small fissure down the center and symmetric erythema well-demarcated in a region about 6 x 3 cm. Nontender. No fluctuance.          Assessment & Plan:  Skin rash buttock region. Probable Candida rash. Keep area dry as possible. Lotrisone cream twice daily. Touch base in 2 weeks if not clearing.

## 2016-02-19 NOTE — Progress Notes (Signed)
Pre visit review using our clinic review tool, if applicable. No additional management support is needed unless otherwise documented below in the visit note. 

## 2016-03-07 ENCOUNTER — Encounter: Payer: Self-pay | Admitting: *Deleted

## 2016-03-07 ENCOUNTER — Encounter: Payer: Medicare Other | Attending: Family Medicine | Admitting: *Deleted

## 2016-03-07 DIAGNOSIS — Z029 Encounter for administrative examinations, unspecified: Secondary | ICD-10-CM | POA: Diagnosis present

## 2016-03-07 DIAGNOSIS — E119 Type 2 diabetes mellitus without complications: Secondary | ICD-10-CM

## 2016-03-07 NOTE — Progress Notes (Signed)
Medical Nutrition Therapy: 03/07/2016  Appt start time:  0730    end time:  0800  Patient states his last A1c was 7.4 %   Assessment:  Primary concerns today: patient here for follow up diabetes education. He states he recently was hospitalized for severe back and leg pain and is no longer walking due to this pain. He received steroid injecitons for the pain and states he increased his insulin dose by 2 units to help manage the affect on his BG. He is interested in alternate forms of exercise until he is able to resume his walking again   TANITA  BODY COMP RESULTS on 07/21/13  Weight 255.5 lb   BMI (kg/m^2) 38.8   Fat Mass (lbs) 97.5 lb   Fat Free Mass (lbs) 158.0 lb   Total Body Water (lbs) 115.5 lb   TANITA  BODY COMP RESULTS on 10/21/13  Weight 257.5 lb   BMI (kg/m^2) 39.2 lb   Fat Mass (lbs) 97.5 lb   Fat Free Mass (lbs) 160.0 lb   Total Body Water (lbs) 117.0 lb   TANITA  BODY COMP RESULTS on 01/20/14  Weight 248.5 lb   BMI (kg/m^2) 37.8   Fat Mass (lbs) 96.0 lb   Fat Free Mass (lbs) 152.5 lb   Total Body Water (lbs) 111.5 lb   TANITA  BODY COMP RESULTS  Weight 250.0 lb   BMI (kg/m^2) 38.0   Fat Mass (lbs) 100.0 lb   Fat Free Mass (lbs) 150.0 lb   Total Body Water (lbs) 110.0 lb   TANITA  BODY COMP RESULTS  07/27/2014 Weight 249.0 lb   BMI (kg/m^2) 37.9   Fat Mass (lbs) 103.5   Fat Free Mass (lbs) 145.5   Total Body Water (lbs) 106.5 lb   TANITA  BODY COMP RESULTS 10/12/2014  Weight 251 lb   BMI (kg/m^2) 38.2   Fat Mass (lbs) 92.0 lb   Fat Free Mass (lbs) 159.0 lb   Total Body Water (lbs) 116.5 lb   TANITA  BODY COMP RESULTS 12/07/2014   Weight 258 lb   BMI (kg/m^2) 39.2   Fat Mass (lbs) 86.5 lb   Fat Free Mass (lbs) 171.5 lb   Total Body Water (lbs) 125.5 lb    TANITA  BODY COMP RESULTS 05/10/15  Weight (lbs) 241.0 lb   BMI (kg/m^2) 36.6   Fat Mass (lbs) 93.5   Fat Free Mass (lbs) 147.5   Total Body Water (lbs) 108.0   TANITA  BODY COMP RESULTS 08/30/15  Weight Lbs) 235.0 lb   BMI (kg/m^2) 35.7   Fat Mass (lbs) 84.5   Fat Free Mass (lbs) 150.5   Total Body Water (lbs) 110.0   TANITA  BODY COMP RESULTS 12/06/15  Weight (lbs) 237.5   BMI (kg/m^2) 36.1   Fat Mass (lbs) 89.0   Fat Free Mass (lbs) 148.5   Total Body Water (lbs) 108.5   TANITA  BODY COMP RESULTS 03/07/16  Weight (lbs) 252.5 lb   BMI (kg/m^2) 38.4   Fat Mass (lbs) 95.5   Fat Free Mass (lbs) 157.0   Total Body Water (lbs) 115.0   Weight is up from decreased exercise and steroid medications needed for his back pain.  MEDICATIONS: see list, Diabetes medications are now Invokana, Metformin and Lantus insulin   Usual physical activity: limited at this time    Intervention: Nutrition counseling and diabetes education continued. Discussed options for increasing his activity level with Arm Chair Exercise, recumbent bicycle, or water  exercises  Plan:  Continue to aim for 3 Carb Choices per meal (45 grams) +/- 1 either way  Continue to aim for 0-2 Carbs per snack if hungry  Continue reading food labels for Total Carbohydrate and Fat Grams of foods Continue with your activity level daily as tolerated Continue checking BG at alternate times per day on occasion Continue taking Lantus at night as directed by your MD    Handouts given during visit include: Tanita Scale results handout  Arm Chair Exercise  Yellow Meal Plan Card  Monitoring/Evaluation:  Dietary intake, exercise, reading food labels, and body weight in 3 months.

## 2016-03-24 ENCOUNTER — Ambulatory Visit: Payer: Medicare Other | Admitting: Adult Health

## 2016-03-31 ENCOUNTER — Other Ambulatory Visit: Payer: Self-pay | Admitting: Internal Medicine

## 2016-04-03 DIAGNOSIS — M25572 Pain in left ankle and joints of left foot: Secondary | ICD-10-CM | POA: Diagnosis not present

## 2016-04-15 ENCOUNTER — Other Ambulatory Visit: Payer: Self-pay | Admitting: Cardiology

## 2016-04-15 NOTE — Telephone Encounter (Signed)
Rx Refill

## 2016-04-21 ENCOUNTER — Ambulatory Visit (INDEPENDENT_AMBULATORY_CARE_PROVIDER_SITE_OTHER): Payer: Medicare Other | Admitting: Adult Health

## 2016-04-21 ENCOUNTER — Encounter: Payer: Self-pay | Admitting: Adult Health

## 2016-04-21 VITALS — BP 112/74 | HR 86 | Temp 98.0°F | Ht 68.0 in | Wt 259.0 lb

## 2016-04-21 DIAGNOSIS — Z720 Tobacco use: Secondary | ICD-10-CM

## 2016-04-21 DIAGNOSIS — G4733 Obstructive sleep apnea (adult) (pediatric): Secondary | ICD-10-CM | POA: Diagnosis not present

## 2016-04-21 DIAGNOSIS — I2510 Atherosclerotic heart disease of native coronary artery without angina pectoris: Secondary | ICD-10-CM

## 2016-04-21 MED ORDER — VARENICLINE TARTRATE 1 MG PO TABS: 1.0000 mg | ORAL_TABLET | Freq: Two times a day (BID) | ORAL | Status: DC

## 2016-04-21 MED ORDER — VARENICLINE TARTRATE 0.5 MG PO TABS: 0.5000 mg | ORAL_TABLET | Freq: Two times a day (BID) | ORAL | Status: DC

## 2016-04-21 NOTE — Assessment & Plan Note (Signed)
Set up mask fitting to sleep lab -look at nasal pillows.  Continue on CPAP At bedtime   Work on wearing for at least 4hr each night.  Download in 6 weeks after new mask.  Work on weight loss.  Follow up Dr. Halford Chessman  In 1 year and As needed

## 2016-04-21 NOTE — Patient Instructions (Signed)
Set up mask fitting to sleep lab -look at nasal pillows.  Continue on CPAP At bedtime   Work on wearing for at least 4hr each night.  Download in 6 weeks after new mask.  Work on weight loss.  Work on not smoking .  Begin Chantix starter pack and then transition to maintenance.  Follow up Dr. Halford Chessman  In 1 year and As needed

## 2016-04-21 NOTE — Assessment & Plan Note (Signed)
Smoking cessation   Plan  Work on not smoking .  Begin Chantix starter pack and then transition to maintenance.  Follow up Dr. Halford Chessman  In 1 year and As needed

## 2016-04-21 NOTE — Progress Notes (Signed)
Subjective:    Patient ID: Steve Charles, male    DOB: Apr 27, 1946, 70 y.o.   MRN: [id number]  HPI 70 yo male with severe OSA    Tests: PSG 03/26/11>>AHI 34.5, SpO2 low 78%.  CPAP 9 cm>>AHI 8, +REM/supine PFT 05/01/11>>FEV1 2.07(72%), FEV1% 65, TLC 6.79(111%), RV 3.42(146%), DLCO 69%, no BD CPAP 10/16/11 to 12/04/11>>Used on 50 of 50 nights with average 4 hrs 12 min.  Average AHI 3.8 with CPAP 12 cm H2O.   04/21/2016 Follow up : OSA  Pt returns for 2 month follow up for sleep apnea.  Recently restarted CPAP after being off for 2 years.  Says he does not like it. Feels he is enclosed too much .  Takes off during nighttime.  Download shows 81% usage but only 2.40hr usage.  He has full face mask We discussed changing to nasal mask.  Encouraged on compliance.    Has went to quit smoking clinic at [facility] but is still smoking  Wants to try chantix. Discussed potential side effects.  Long discussion on quitting smoking.    Past Medical History  Diagnosis Date  . DYSLIPIDEMIA 11/29/2009  . HYPERTENSION, ESSENTIAL 11/29/2009  . URTICARIA 02/06/2010  . BPH (benign prostatic hypertrophy)   . Osteoarthritis   . DM (diabetes mellitus) ([place])   . OSA (obstructive sleep apnea) 03/26/2011    CPAP 9 cm H2O  . COPD (chronic obstructive pulmonary disease) ([place]) 05/01/2011    PFT 05/01/11>>FEV1 2.07(72%), FEV1% 65, TLC 6.79(111%), DLCO 69%, no BD  . CAD (coronary artery disease)   . SVT (supraventricular tachycardia) ([place])    Current Outpatient Prescriptions on File Prior to Visit  Medication Sig Dispense Refill  . aspirin 81 MG tablet Take 81 mg by mouth every evening.     . Canagliflozin (INVOKANA) 300 MG TABS Take 1 tablet by mouth daily.    . CHROMIUM-CINNAMON PO Take 2,000 mg by mouth daily.    . clotrimazole-betamethasone (LOTRISONE) cream Apply 1 application topically 2 (two) times daily. 30 g 1  . Cyanocobalamin (VITAMIN B 12 PO) Take 1 tablet by mouth daily.    . diclofenac  (VOLTAREN) 75 MG EC tablet Take 75 mg by mouth 2 (two) times daily. Reported on 02/11/2016  2  . furosemide (LASIX) 40 MG tablet Take 40 mg by mouth daily as needed (swelling).    . gabapentin (NEURONTIN) 300 MG capsule Take 300 mg by mouth 3 (three) times daily.    Marland Kitchen glimepiride (AMARYL) 1 MG tablet Take 1 mg by mouth 2 (two) times daily. Reported on 11/21/2015  4  . LANTUS SOLOSTAR 100 UNIT/ML Solostar Pen Inject 10 Units into the skin daily at 10 pm.   1  . metFORMIN (GLUCOPHAGE) 500 MG tablet Take 1,000 mg by mouth 2 (two) times daily with a meal. Take 2 tablets twice a day    . pravastatin (PRAVACHOL) 40 MG tablet TAKE ONE TABLET EVERY EVENING 90 tablet 1  . Turmeric 450 MG CAPS Take 450 mg by mouth daily.    . verapamil (CALAN-SR) 240 MG CR tablet TAKE ONE TABLET EACH DAY 90 tablet 1   No current facility-administered medications on file prior to visit.      Review of Systems Constitutional:   No  weight loss, night sweats,  Fevers, chills,  +fatigue, or  lassitude.  HEENT:   No headaches,  Difficulty swallowing,  Tooth/dental problems, or  Sore throat,  No sneezing, itching, ear ache, nasal congestion, post nasal drip,   CV:  No chest pain,  Orthopnea, PND, swelling in lower extremities, anasarca, dizziness, palpitations, syncope.   GI  No heartburn, indigestion, abdominal pain, nausea, vomiting, diarrhea, change in bowel habits, loss of appetite, bloody stools.   Resp: No shortness of breath with exertion or at rest.  No excess mucus, no productive cough,  No non-productive cough,  No coughing up of blood.  No change in color of mucus.  No wheezing.  No chest wall deformity  Skin: no rash or lesions.  GU: no dysuria, change in color of urine, no urgency or frequency.  No flank pain, no hematuria   MS:  No joint pain or swelling.  No decreased range of motion.  No back pain.  Psych:  No change in mood or affect. No depression or anxiety.  No memory  loss.         Objective:   Physical Exam  Filed Vitals:   04/21/16 0901  BP: 112/74  Pulse: 86  Temp: 98 F (36.7 C)  TempSrc: Oral  Height: 5\' 8"  (1.727 m)  Weight: 259 lb (117.482 kg)  SpO2: 98%  Body mass index is 39.39 kg/(m^2).   GEN: A/Ox3; pleasant , NAD, morbid obesity   HEENT:  /AT,  EACs-clear, TMs-wnl, NOSE-clear, THROAT-clear, no lesions, no postnasal drip or exudate noted. Class 2-3 MP airway   NECK:  Supple w/ fair ROM; no JVD; normal carotid impulses w/o bruits; no thyromegaly or nodules palpated; no lymphadenopathy.  RESP  Clear  P & A; w/o, wheezes/ rales/ or rhonchi.no accessory muscle use, no dullness to percussion  CARD:  RRR, no m/r/g  , tr  peripheral edema, pulses intact, no cyanosis or clubbing.  GI:   Soft & nt; nml bowel sounds; no organomegaly or masses detected.  Musco: Warm bil, no deformities or joint swelling noted.   Neuro: alert, no focal deficits noted.    Skin: Warm, no lesions or rashes  Tammy Parrett NP-C  [facility]  04/21/2016       Assessment & Plan:

## 2016-04-21 NOTE — Progress Notes (Signed)
Reviewed and agree with assessment/plan.  Chesley Mires, MD [facility] Pulmonary/Critical Care 04/21/2016, 11:14 PM Pager:  [phone]

## 2016-04-21 NOTE — Addendum Note (Signed)
Addended by: Osa Craver on: 04/21/2016 03:53 PM   Modules accepted: Orders

## 2016-04-24 ENCOUNTER — Encounter: Payer: Self-pay | Admitting: Family Medicine

## 2016-04-24 ENCOUNTER — Ambulatory Visit (INDEPENDENT_AMBULATORY_CARE_PROVIDER_SITE_OTHER): Payer: Medicare Other | Admitting: Family Medicine

## 2016-04-24 DIAGNOSIS — S80812A Abrasion, left lower leg, initial encounter: Secondary | ICD-10-CM | POA: Diagnosis not present

## 2016-04-24 DIAGNOSIS — Z23 Encounter for immunization: Secondary | ICD-10-CM | POA: Diagnosis not present

## 2016-04-24 DIAGNOSIS — W19XXXA Unspecified fall, initial encounter: Secondary | ICD-10-CM

## 2016-04-24 DIAGNOSIS — I2510 Atherosclerotic heart disease of native coronary artery without angina pectoris: Secondary | ICD-10-CM

## 2016-04-24 MED ORDER — SILVER SULFADIAZINE 1 % EX CREA: 1.0000 "application " | TOPICAL_CREAM | Freq: Two times a day (BID) | CUTANEOUS | Status: AC

## 2016-04-24 NOTE — Patient Instructions (Addendum)
A few things to remember from today's visit:    [name] I have seen you today for an acute visit because your primary care provider was not available. Monitor for signs of worsening symptoms and seek immediate medical attention if any concerning/warning symptom as we discussed. Follow in 1-2 weeks  before if symptoms get worse.     1. Accidental fall   2. Excoriation of left lower leg, initial encounter  - silver sulfADIAZINE (SILVADENE) 1 % cream; Apply 1 application topically 2 (two) times daily.  Dispense: 50 g; Refill: 0      Cautions with fall: slow walk, avoid rugs on floors, use a cane if you have one.  Keep wound clean with soap and water, scrub it genteelly. Lower extremity elevation. Monitor for signs of infection:worsening pain, edema,redness,pus,fever,chills among some.  Td given today.

## 2016-04-24 NOTE — Progress Notes (Signed)
HPI:   [name] is a 70 y.o. male, who is here today complaining of injury on left LE after a fall on concrete yesterday around 5 pm. Tripped while he was walking outdoors, felt on left lower extremity and excoriated left calf. This is his first fall in a year. Denies head trauma.  He cleaned area with water after injury. Last Td over 5 years ago. He has applied OTC Neosporin oint.  Moderate pain, which is exacerbated by palpation, no ankle or knee pain after fall, no limitation of ROM. + LE edema, unchanged from his baseline.  Hx of DM II, reporting BS's as "very good."     Review of Systems  Constitutional: Negative for fever, chills and fatigue.  Cardiovascular: Positive for leg swelling. Negative for chest pain and palpitations.  Musculoskeletal: Negative for arthralgias and gait problem.  Skin: Positive for wound. Negative for rash.  Neurological: Negative for dizziness, syncope, weakness and headaches.  Psychiatric/Behavioral: Negative for confusion and sleep disturbance.      Current Outpatient Prescriptions on File Prior to Visit  Medication Sig Dispense Refill  . aspirin 81 MG tablet Take 81 mg by mouth every evening.     . Canagliflozin (INVOKANA) 300 MG TABS Take 1 tablet by mouth daily.    . CHROMIUM-CINNAMON PO Take 2,000 mg by mouth daily.    . clotrimazole-betamethasone (LOTRISONE) cream Apply 1 application topically 2 (two) times daily. 30 g 1  . Cyanocobalamin (VITAMIN B 12 PO) Take 1 tablet by mouth daily.    . diclofenac (VOLTAREN) 75 MG EC tablet Take 75 mg by mouth 2 (two) times daily. Reported on 02/11/2016  2  . furosemide (LASIX) 40 MG tablet Take 40 mg by mouth daily as needed (swelling).    . gabapentin (NEURONTIN) 300 MG capsule Take 300 mg by mouth 3 (three) times daily.    Marland Kitchen glimepiride (AMARYL) 1 MG tablet Take 1 mg by mouth 2 (two) times daily. Reported on 11/21/2015  4  . LANTUS SOLOSTAR 100 UNIT/ML Solostar Pen Inject 10  Units into the skin daily at 10 pm.   1  . metFORMIN (GLUCOPHAGE) 500 MG tablet Take 1,000 mg by mouth 2 (two) times daily with a meal. Take 2 tablets twice a day    . pravastatin (PRAVACHOL) 40 MG tablet TAKE ONE TABLET EVERY EVENING 90 tablet 1  . Turmeric 450 MG CAPS Take 450 mg by mouth daily.    . varenicline (CHANTIX CONTINUING MONTH PAK) 1 MG tablet Take 1 tablet (1 mg total) by mouth 2 (two) times daily. 60 tablet 2  . varenicline (CHANTIX) 0.5 MG tablet Take 1 tablet (0.5 mg total) by mouth 2 (two) times daily. 60 tablet 0  . verapamil (CALAN-SR) 240 MG CR tablet TAKE ONE TABLET EACH DAY 90 tablet 1   No current facility-administered medications on file prior to visit.     Past Medical History  Diagnosis Date  . DYSLIPIDEMIA 11/29/2009  . HYPERTENSION, ESSENTIAL 11/29/2009  . URTICARIA 02/06/2010  . BPH (benign prostatic hypertrophy)   . Osteoarthritis   . DM (diabetes mellitus) ([place])   . OSA (obstructive sleep apnea) 03/26/2011    CPAP 9 cm H2O  . COPD (chronic obstructive pulmonary disease) ([place]) 05/01/2011    PFT 05/01/11>>FEV1 2.07(72%), FEV1% 65, TLC 6.79(111%), DLCO 69%, no BD  . CAD (coronary artery disease)   . SVT (supraventricular tachycardia) ([facility])    Allergies  Allergen Reactions  . Codeine  Sulfate Hives  . Morphine And Related Hives    Oral morphine/ hives   . Propoxyphene N-Acetaminophen Hives  . Ramipril Hives  . Shellfish Allergy Hives    Social History   Social History  . Marital Status: Married    Spouse Name: N/A  . Number of Children: 3  . Years of Education: N/A   Occupational History  . Retired Other   Social History Main Topics  . Smoking status: Current Every Day Smoker -- 1.00 packs/day for 50 years    Types: Cigarettes  . Smokeless tobacco: Never Used     Comment: pt is again smoking < 1 PPD  . Alcohol Use: 0.0 oz/week    0 Standard drinks or equivalent per week     Comment: occasionallly  . Drug Use: No  . Sexual Activity: Not  Asked   Other Topics Concern  . None   Social History Narrative    Filed Vitals:   04/24/16 1420  BP: 112/60  Pulse: 100  Temp: 97.8 F (36.6 C)  Resp: 16   Body mass index is 39.24 kg/(m^2).      Physical Exam  Constitutional: He is oriented to person, place, and time. He appears well-developed. No distress.  HENT:  Head: Atraumatic.  Cardiovascular:  Pulses:      Dorsalis pedis pulses are 2+ on the left side.       Posterior tibial pulses are 2+ on the left side.  Varicose veins bilateral,LE  Musculoskeletal: He exhibits edema (1+ pitting edema LE bilateral.).  Neurological: He is alert and oriented to person, place, and time. He has normal strength. Coordination normal.  Stable gait with no assistance.  Skin: Skin is warm. Abrasion noted. No rash noted. There is erythema (minimal, surrounding injured area.).     About 4 cm superficial excoriation medial aspect of left calf. It is clean, no necrotic tissue appreciated.  Psychiatric: He has a normal mood and affect.  Well groomed, good eye contact.      ASSESSMENT AND PLAN:     Steve Charles was seen today for pt fell and has a bruise.  Diagnoses and all orders for this visit:  Accidental fall  Excoriation of left lower leg, initial encounter -     silver sulfADIAZINE (SILVADENE) 1 % cream; Apply 1 application topically 2 (two) times daily.   Td vaccination recommended and given today. Keep area clean with soap and water, gentile scrubbing recommended when cleaning area. LE elevation. Topical Silvadene recommended for now, I do not think oral abx is needed at this time. Educated about signs of infection. Fall precautions discussed. Clearly instructed about warning signs. F/U in 7-10 days,sooner if needed.       -He was advised to return or notify a doctor immediately if symptoms worsen or persist or new concerns arise, he voices understanding.       Betty G. Martinique, MD  [facility] [place] office.

## 2016-04-24 NOTE — Progress Notes (Signed)
Pre visit review using our clinic review tool, if applicable. No additional management support is needed unless otherwise documented below in the visit note. 

## 2016-04-25 ENCOUNTER — Telehealth: Payer: Self-pay | Admitting: Family Medicine

## 2016-04-25 NOTE — Telephone Encounter (Signed)
[place] [facility] - Client  [facility]     Patient Name: Steve Charles Client [facility] - Client    Client Site [place] - Day    Physician Martinique, Betty - MD    Contact Type Call    Who Is Calling Patient / Member / Family / Caregiver    Call Type Triage / Clinical    Relationship To Patient Self    Return Phone Number [phone] (Primary)    Chief Complaint Leg Injury  Gender: Male Reason for Call Symptomatic / Request for Health Information  DOB: July 28, 1946  Initial Comment Caller states has a bad scrape on left calf, burns like fire. He saw dr yesterday  Age: 70 Y 2 M 25 D Appointment Disposition EMR Appointment Not Necessary  Return Phone Number: [phone] (Primary) Info pasted into Epic Yes  Address:  PreDisposition Call Doctor  City/State/Zip: [place]  Translation No    Nurse Assessment  Nurse: Lavera Guise, RN, Vaughan Basta Date/Time (Eastern Time): 04/25/2016 4:43:27 PM  Confirm and document reason for call. If symptomatic, describe symptoms. You must click the next button to save text entered. ---Caller states that he saw Dr. Martinique yesterday. Fell on Wednesday and got a sore on left calf. Used oinment that Dr. told him to use. Very hot and very painful today. No fever. Has difficulty putting weight on the leg. 2 x 3 inches large. No pus, but still bleeds and is very hot to touch. Also is grainy. Had a tetanus booster yesterday.  Has the patient traveled out of the country within the last 30 days? ---Not Applicable  Does the patient have any new or worsening symptoms? ---Yes  Will a triage be completed? ---Yes  Related visit to physician within the last 2 weeks? ---Yes  Does the PT have any chronic conditions? (i.e. diabetes, asthma, etc.) ---Yes  List chronic conditions. ---diabetes joint replacement, knee replacement on the same leg  Is this a behavioral health or  substance abuse call? ---No    Guidelines      Guideline Title Affirmed Question Affirmed Notes Nurse Date/Time (Eastern Time)  Leg Injury [1] After 3 days AND [2] pain not improved  Lavera Guise, RNVaughan Basta 04/25/2016 4:47:14 PM  Disp. Time Eilene Ghazi Time) Disposition Final User   04/25/2016 5:02:20 PM Send To RN Personal  Lavera Guise, RN, Vaughan Basta   04/25/2016 4:56:14 PM See PCP When Office is Open (within 3 days) Yes Kluth, RN, Phineas Semen Understands: Yes   Disagree/Comply: Comply      Care Advice Given Per Guideline         LOCAL HEAT: * Beginning 48 hours after an injury, apply a warm washcloth or heating pad for 10 minutes three times a day. * This will help increase circulation and improve healing. SEE PCP WITHIN 3 DAYS: PAIN MEDICINES: IBUPROFEN (E.G., MOTRIN, ADVIL): * Take 400 mg (two 200 mg pills) by mouth every 6 hours as needed. CALL BACK IF: * Pain becomes severe * You become worse. CARE ADVICE given per Leg Injury (Adult) guideline.             Referrals   REFERRED TO PCP OFFICE

## 2016-04-25 NOTE — Telephone Encounter (Signed)
[facility] said the womb on the patient's leg is hot to the touch and she thinks he probably needs some antibiotics called in.

## 2016-04-26 DIAGNOSIS — L03116 Cellulitis of left lower limb: Secondary | ICD-10-CM | POA: Diagnosis not present

## 2016-04-26 DIAGNOSIS — E119 Type 2 diabetes mellitus without complications: Secondary | ICD-10-CM | POA: Diagnosis not present

## 2016-04-28 ENCOUNTER — Encounter: Payer: Self-pay | Admitting: Adult Health

## 2016-04-28 ENCOUNTER — Telehealth: Payer: Self-pay

## 2016-04-28 ENCOUNTER — Ambulatory Visit (HOSPITAL_BASED_OUTPATIENT_CLINIC_OR_DEPARTMENT_OTHER): Payer: Medicare Other | Attending: Adult Health | Admitting: Pulmonary Disease

## 2016-04-28 DIAGNOSIS — G4733 Obstructive sleep apnea (adult) (pediatric): Secondary | ICD-10-CM

## 2016-04-28 DIAGNOSIS — Z9989 Dependence on other enabling machines and devices: Principal | ICD-10-CM

## 2016-04-28 NOTE — Telephone Encounter (Signed)
Called patient this morning to follow up and see what status of leg was. Left voicemail message asking for return phone call.       [place] [facility] - Client [place] Patient Name: Steve Charles Gender: Male DOB: 1946/07/06 Age: 70 Y 2 M 25 D Return Phone Number: [phone] (Primary) Address: City/State/Zip: [place] Client [place] [facility] - Client Client Site [place] - Day Physician Martinique, Betty - MD Contact Type Call Who Is Calling Patient / Member / Family / Caregiver Call Type Triage / Clinical Relationship To Patient Self Return Phone Number [phone] (Primary) Chief Complaint Leg Injury Reason for Call Symptomatic / Request for [place] states has a bad scrape on left calf, burns like fire. He saw dr yesterday Appointment Disposition EMR Appointment Not Necessary Info pasted into Epic Yes [place] Not Listed Caller will go to an UC tomorrow PreDisposition Call Doctor Translation No Nurse Assessment Nurse: Lavera Guise, RN, Vaughan Basta Date/Time (Eastern Time): 04/25/2016 4:43:27 PM Confirm and document reason for call. If symptomatic, describe symptoms. You must click the next button to save text entered. ---Caller states that he saw Dr. Martinique yesterday. Fell on Wednesday and got a sore on left calf. Used oinment that Dr. told him to use. Very hot and very painful today. No fever. Has difficulty putting weight on the leg. 2 x 3 inches large. No pus, but still bleeds and is very hot to touch. Also is grainy. Had a tetanus booster yesterday. Has the patient traveled out of the country within the last 30 days? ---Not Applicable Does the patient have any new or worsening symptoms? ---Yes Will a triage be completed? ---Yes Related visit to physician within the last 2 weeks? ---Yes Does the PT have any chronic conditions? (i.e.  diabetes, asthma, etc.) ---Yes List chronic conditions. ---diabetes joint replacement, knee replacement on the same leg Is this a behavioral health or substance abuse call? ---No Guidelines Guideline Title Affirmed Question Affirmed Notes Nurse Date/Time (Eastern Time) Leg Injury [1] After 3 days AND [2] pain not improved Kluth, Therapist, sports,  Disp. Time Eilene Ghazi Time) Disposition Final User 04/25/2016 5:02:20 PM Send To RN Personal Lavera Guise, RN, Linda 04/25/2016 5:31:20 PM Call Completed Venetia Maxon, RN, Manuela Schwartz 04/25/2016 4:56:14 PM See PCP When Office is Open (within 3 days) Yes Kluth, RN, Phineas Semen Understands: Yes Disagree/Comply: Comply Care Advice Given Per Guideline LOCAL HEAT: * Beginning 48 hours after an injury, apply a warm washcloth or heating pad for 10 minutes three times a day. * This will help increase circulation and improve healing. SEE PCP WITHIN 3 DAYS: PAIN MEDICINES: IBUPROFEN (E.G., MOTRIN, ADVIL): * Take 400 mg (two 200 mg pills) by mouth every 6 hours as needed. CALL BACK IF: * Pain becomes severe * You become worse. CARE ADVICE given per Leg Injury (Adult) guideline.  Referrals REFERRED TO PCP OFFICE

## 2016-04-28 NOTE — Telephone Encounter (Signed)
Spoke with patient who states he went to Urgent Care on Saturday. They gave him a shot of antibiotic and an antibiotic cream. He states his leg is still not any better so he made an appointment at the [facility] for tomorrow for follow up.

## 2016-04-29 ENCOUNTER — Encounter: Payer: Medicare Other | Attending: Internal Medicine | Admitting: Internal Medicine

## 2016-04-29 DIAGNOSIS — Z794 Long term (current) use of insulin: Secondary | ICD-10-CM | POA: Insufficient documentation

## 2016-04-29 DIAGNOSIS — M1990 Unspecified osteoarthritis, unspecified site: Secondary | ICD-10-CM | POA: Diagnosis not present

## 2016-04-29 DIAGNOSIS — L97221 Non-pressure chronic ulcer of left calf limited to breakdown of skin: Secondary | ICD-10-CM | POA: Diagnosis not present

## 2016-04-29 DIAGNOSIS — Z87891 Personal history of nicotine dependence: Secondary | ICD-10-CM | POA: Insufficient documentation

## 2016-04-29 DIAGNOSIS — E7800 Pure hypercholesterolemia, unspecified: Secondary | ICD-10-CM | POA: Insufficient documentation

## 2016-04-29 DIAGNOSIS — E119 Type 2 diabetes mellitus without complications: Secondary | ICD-10-CM | POA: Diagnosis not present

## 2016-04-29 DIAGNOSIS — S81812A Laceration without foreign body, left lower leg, initial encounter: Secondary | ICD-10-CM | POA: Diagnosis not present

## 2016-04-29 DIAGNOSIS — I87332 Chronic venous hypertension (idiopathic) with ulcer and inflammation of left lower extremity: Secondary | ICD-10-CM | POA: Diagnosis not present

## 2016-04-29 DIAGNOSIS — E11622 Type 2 diabetes mellitus with other skin ulcer: Secondary | ICD-10-CM | POA: Insufficient documentation

## 2016-04-29 DIAGNOSIS — I10 Essential (primary) hypertension: Secondary | ICD-10-CM | POA: Insufficient documentation

## 2016-04-29 DIAGNOSIS — W19XXXA Unspecified fall, initial encounter: Secondary | ICD-10-CM | POA: Diagnosis not present

## 2016-04-29 DIAGNOSIS — D649 Anemia, unspecified: Secondary | ICD-10-CM | POA: Diagnosis not present

## 2016-04-29 NOTE — Progress Notes (Signed)
BERLON, WIRTHLIN ([id number]) Visit Report for 04/29/2016 Abuse/Suicide Risk Screen Details Patient Name: Steve Charles, Steve Charles Date of Service: 04/29/2016 8:45 AM Medical Record Patient Account Number: [id number] Number: Treating RN: Baruch Gouty, RN, BSN, Rita [birth year] [phone] y.o. Other Clinician: Date of Birth/Sex: Male) Treating ROBSON, MICHAEL Primary Care Physician: Carolann Littler Physician/Extender: G Referring Physician: SELF, REFERRED Weeks in Treatment: 0 Abuse/Suicide Risk Screen Items Answer ABUSE/SUICIDE RISK SCREEN: Has anyone close to you tried to hurt or harm you recentlyo No Do you feel uncomfortable with anyone in your familyo No Has anyone forced you do things that you didnot want to doo No Do you have any thoughts of harming yourselfo No Patient displays signs or symptoms of abuse and/or neglect. No Electronic Signature(s) Signed: 04/29/2016 3:01:26 PM By: Regan Lemming BSN, RN Entered By: Regan Lemming on 04/29/2016 08:44:19 Steve Charles ([id number]) -------------------------------------------------------------------------------- Activities of Daily Living Details Patient Name: Steve Charles Date of Service: 04/29/2016 8:45 AM Medical Record Patient Account Number: [id number] Number: Treating RN: Baruch Gouty, RN, BSN, Rita 11-30-1945 [phone] y.o. Other Clinician: Date of Birth/Sex: Male) Treating ROBSON, [place] Primary Care Physician: Carolann Littler Physician/Extender: G Referring Physician: SELF, REFERRED Weeks in Treatment: 0 Activities of Daily Living Items Answer Activities of Daily Living (Please select one for each item) Drive Automobile Completely Able Take Medications Completely Able Use Telephone Completely Able Care for Appearance Completely Able Use Toilet Completely Able Bath / Shower Completely Able Dress Self Completely Able Feed Self Completely Able Walk Completely Able Get In / Out Bed Completely Able Housework Completely  Able Prepare Meals Completely Marcus for Self Completely Able Electronic Signature(s) Signed: 04/29/2016 3:01:26 PM By: Regan Lemming BSN, RN Entered By: Regan Lemming on 04/29/2016 08:44:51 Steve Charles ([id number]) -------------------------------------------------------------------------------- Education Assessment Details Patient Name: Steve Charles Date of Service: 04/29/2016 8:45 AM Medical Record Patient Account Number: [id number] Number: Treating RN: Baruch Gouty, RN, BSN, Rita 10/10/1946 [phone] y.o. Other Clinician: Date of Birth/Sex: Male) Treating ROBSON, MICHAEL Primary Care Physician: Carolann Littler Physician/Extender: G Referring Physician: SELF, REFERRED Weeks in Treatment: 0 Primary Learner Assessed: Patient Learning Preferences/Education Level/Primary Language Learning Preference: Explanation Highest Education Level: College or Above Preferred Language: English Cognitive Barrier Assessment/Beliefs Language Barrier: No Physical Barrier Assessment Impaired Vision: Yes Glasses Impaired Hearing: No Decreased Hand dexterity: No Knowledge/Comprehension Assessment Knowledge Level: High Comprehension Level: High Ability to understand written High instructions: Ability to understand verbal High instructions: Motivation Assessment Anxiety Level: Calm Cooperation: Cooperative Education Importance: Acknowledges Need Interest in Health Problems: Asks Questions Perception: Coherent Willingness to Engage in Self- High Management Activities: Readiness to Engage in Self- High Management Activities: Electronic Signature(s) Signed: 04/29/2016 3:01:26 PM By: Regan Lemming BSN, RN Entered By: Regan Lemming on 04/29/2016 08:46:07 Steve Charles ([id number] Charles ([id number]) -------------------------------------------------------------------------------- Fall Risk Assessment Details Patient Name: Steve Charles  Date of Service: 04/29/2016 8:45 AM Medical Record Patient Account Number: [id number] Number: Treating RN: Baruch Gouty RN, BSN, Rita April 25, 1946 [phone] y.o. Other Clinician: Date of Birth/Sex: Male) Treating ROBSON, MICHAEL Primary Care Physician: Carolann Littler Physician/Extender: G Referring Physician: SELF, REFERRED Weeks in Treatment: 0 Fall Risk Assessment Items Have you had 2 or more falls in the last 12 monthso 0 Yes Have you had any fall that resulted in injury in the last 12 monthso 0 Yes FALL RISK ASSESSMENT: History of falling - immediate or within 3 months 25 Yes Secondary diagnosis 0 No Ambulatory aid None/bed rest/wheelchair/nurse 0 Yes Crutches/cane/walker  0 No Furniture 0 No IV Access/Saline Lock 0 No Gait/Training Normal/bed rest/immobile 0 Yes Weak 0 No Impaired 0 No Mental Status Oriented to own ability 0 Yes Electronic Signature(s) Signed: 04/29/2016 3:01:26 PM By: Regan Lemming BSN, RN Entered By: Regan Lemming on 04/29/2016 08:46:24 Steve Charles ([id number]) -------------------------------------------------------------------------------- Foot Assessment Details Patient Name: Steve Charles Date of Service: 04/29/2016 8:45 AM Medical Record Patient Account Number: [id number] Number: Treating RN: Baruch Gouty, RN, BSN, Rita 1946-01-21 [phone] y.o. Other Clinician: Date of Birth/Sex: Male) Treating ROBSON, MICHAEL Primary Care Physician: Carolann Littler Physician/Extender: G Referring Physician: SELF, REFERRED Weeks in Treatment: 0 Foot Assessment Items Site Locations + = Sensation present, - = Sensation absent, C = Callus, U = Ulcer R = Redness, W = Warmth, M = Maceration, PU = Pre-ulcerative lesion F = Fissure, S = Swelling, D = Dryness Assessment Right: Left: Other Deformity: No No Prior Foot Ulcer: No No Prior Amputation: No No Charcot Joint: No No Ambulatory Status: Ambulatory Without Help Gait: Steady Electronic  Signature(s) Signed: 04/29/2016 3:01:26 PM By: Regan Lemming BSN, RN Entered By: Regan Lemming on 04/29/2016 08:46:38 Steve Charles ([id number]) -------------------------------------------------------------------------------- Nutrition Risk Assessment Details Patient Name: Steve Charles Date of Service: 04/29/2016 8:45 AM Medical Record Patient Account Number: [id number] Number: Treating RN: Baruch Gouty, RN, BSN, Rita Oct 10, 1946 (70 y.o. Other Clinician: Date of Birth/Sex: Male) Treating ROBSON, MICHAEL Primary Care Physician: Carolann Littler Physician/Extender: G Referring Physician: SELF, REFERRED Weeks in Treatment: 0 Height (in): Weight (lbs): Body Mass Index (BMI): Nutrition Risk Assessment Items NUTRITION RISK SCREEN: I have an illness or condition that made me change the kind and/or 0 No amount of food I eat I eat fewer than two meals per day 0 No I eat few fruits and vegetables, or milk products 0 No I have three or more drinks of beer, liquor or wine almost every day 0 No I have tooth or mouth problems that make it hard for me to eat 0 No I don't always have enough money to buy the food I need 0 No I eat alone most of the time 0 No I take three or more different prescribed or over-the-counter drugs a 0 No day Without wanting to, I have lost or gained 10 pounds in the last six 0 No months I am not always physically able to shop, cook and/or feed myself 0 No Nutrition Protocols Good Risk Protocol 0 No interventions needed Moderate Risk Protocol Electronic Signature(s) Signed: 04/29/2016 3:01:26 PM By: Regan Lemming BSN, RN Entered By: Regan Lemming on 04/29/2016 08:46:29

## 2016-04-30 NOTE — Progress Notes (Signed)
Steve Charles, Steve Charles ([id number]) Visit Report for 04/29/2016 Chief Complaint Document Details Patient Name: Steve Charles, Steve Charles Date of Service: 04/29/2016 8:45 AM Medical Record Patient Account Number: [id number] Number: Treating RN: Baruch Gouty, RN, BSN, Rita 09-Apr-1946 [phone] y.o. Other Clinician: Date of Birth/Sex: Male) Treating Steve Charles Primary Care Physician: Carolann Littler Physician/Extender: G Referring Physician: SELF, REFERRED Weeks in Treatment: 0 Information Obtained from: Patient Chief Complaint Patient is here for review of a traumatic wound in the setting of type 2 diabetes to his left posterior calf Electronic Signature(s) Signed: 04/30/2016 12:38:30 PM By: Linton Ham MD Entered By: Linton Ham on 04/29/2016 09:48:33 Steve Charles ([id number]) -------------------------------------------------------------------------------- Debridement Details Patient Name: Steve Charles Date of Service: 04/29/2016 8:45 AM Medical Record Patient Account Number: [id number] Number: Treating RN: Baruch Gouty, RN, BSN, Rita Jul 10, 1946 [phone] y.o. Other Clinician: Date of Birth/Sex: Male) Treating Steve Charles, Steve Charles Primary Care Physician: Carolann Littler Physician/Extender: G Referring Physician: SELF, REFERRED Weeks in Treatment: 0 Debridement Performed for Wound #1 Left,Posterior Lower Leg Assessment: Performed By: Physician Steve Dillon, MD Debridement: Debridement Pre-procedure Yes Verification/Time Out Taken: Start Time: 09:20 Pain Control: Lidocaine 4% Topical Solution Level: Skin/Subcutaneous Tissue Total Area Debrided (L x 7 (cm) x 3 (cm) = 21 (cm) W): Tissue and other Non-Viable, Fibrin/Slough, Subcutaneous material debrided: Instrument: Curette Bleeding: Minimum Hemostasis Achieved: Pressure End Time: 09:20 Procedural Pain: 0 Post Procedural Pain: 0 Response to Treatment: Procedure was tolerated well Post Debridement Measurements of  Total Wound Length: (cm) 7 Width: (cm) 3 Depth: (cm) 0.1 Volume: (cm) 1.649 Post Procedure Diagnosis Same as Pre-procedure Electronic Signature(s) Signed: 04/29/2016 3:01:26 PM By: Regan Lemming BSN, RN Signed: 04/30/2016 12:38:30 PM By: Linton Ham MD Entered By: Linton Ham on 04/29/2016 [place], [place] ([id number]) -------------------------------------------------------------------------------- HPI Details Patient Name: Steve Charles Date of Service: 04/29/2016 8:45 AM Medical Record Patient Account Number: [id number] Number: Treating RN: Baruch Gouty, RN, BSN, Rita 28-Jan-1946 [phone] y.o. Other Clinician: Date of Birth/Sex: Male) Treating Retia Cordle Primary Care Physician: Carolann Littler Physician/Extender: G Referring Physician: SELF, REFERRED Weeks in Treatment: 0 History of Present Illness HPI Description: 04/29/16; this is a 70 year old man who is a type II diabetic on insulin. He was coming back into his home from a mailbox and fell traumatizing the posterior aspect of his left calf on concrete. He went to see his primary physician and received an injection of antibiotics, Td vaccination, Silvadene cream. I believe he also went to an urgent care and gone on an oral antibiotic but I am not sure which one however he says he is on it for 10 days. He has been using a combination of Neosporin. He does not have a history of PAD or diabetic neuropathy although he states he has spinal cord stenosis resulting in some inguinal radicular symptoms. He apparently was seen in the wound care clinic in [place] predominantly related to what sounds like venous insufficiency wounds on the other leg some years ago. ABIs in this clinic were 1.03 bilaterally waveforms were multiphasic Electronic Signature(s) Signed: 04/30/2016 12:38:30 PM By: Linton Ham MD Entered By: Linton Ham on 04/29/2016 09:57:39 Steve Charles  ([id number]) -------------------------------------------------------------------------------- Physical Exam Details Patient Name: Steve Charles Date of Service: 04/29/2016 8:45 AM Medical Record Patient Account Number: [id number] Number: Treating RN: Baruch Gouty, RN, BSN, Rita [birth year] [phone] y.o. Other Clinician: Date of Birth/Sex: Male) Treating Steve Charles Primary Care Physician: Carolann Littler Physician/Extender: G Referring Physician: SELF, REFERRED Weeks in Treatment: 0 Constitutional Sitting or  standing Blood Pressure is within target range for patient.. Pulse regular and within target range for patient.Marland Kitchen Respirations regular, non-labored and within target range.. Temperature is normal and within the target range for the patient.. Eyes Conjunctivae clear. No discharge.. Cardiovascular Heart rhythm and rate regular, without murmur or gallop.. . Pedal pulses palpable and strong bilaterally.. Edema present in both extremities. This is mild chronic venous insufficiency with hemosiderin deposition. Gastrointestinal (GI) Abdomen is soft and non-distended without masses or tenderness. Bowel sounds active in all quadrants.. Lymphatic None palpable in the popliteal or inguinal areas. Neurological His vibration sense seemed intact. Psychiatric No evidence of depression, anxiety, or agitation. Calm, cooperative, and communicative. Appropriate interactions and affect.. Notes Wound exam; the wound is a fairly large wound on the posterior left calf however it is superficial. Already covered in a yellowish surface slough that was successfully debridement. There is no evidence of surrounding infection. Just above the actual wound the patient is noted swelling and really significant tenderness. There is warmth in this area but no obvious subdermal or dermal issues. I suspect he has a reasonably deep hematoma/bruise in this area. Fortunately the skin above this appears to be  intact. There is no evidence of a DVT or cellulitis Electronic Signature(s) Signed: 04/30/2016 12:38:30 PM By: Linton Ham MD Entered By: Linton Ham on 04/29/2016 09:55:44 Steve Charles ([id number]) -------------------------------------------------------------------------------- Physician Orders Details Patient Name: Steve Charles Date of Service: 04/29/2016 8:45 AM Medical Record Patient Account Number: [id number] Number: Treating RN: Baruch Gouty, RN, BSN, Rita [birth year] [phone] y.o. Other Clinician: Date of Birth/Sex: Male) Treating Steve Charles Primary Care Physician: Carolann Littler Physician/Extender: G Referring Physician: SELF, REFERRED Weeks in Treatment: 0 Verbal / Phone Orders: Yes Clinician: Afful, RN, BSN, Rita Read Back and Verified: Yes Diagnosis Coding Wound Cleansing Wound #1 Left,Posterior Lower Leg o Cleanse wound with mild soap and water o May shower with protection. o No tub bath. Skin Barriers/Peri-Wound Care Wound #1 Left,Posterior Lower Leg o Barrier cream Primary Wound Dressing o Prisma Ag Secondary Dressing Wound #1 Left,Posterior Lower Leg o Dry Gauze Dressing Change Frequency Wound #1 Left,Posterior Lower Leg o Change dressing every week Follow-up Appointments Wound #1 Left,Posterior Lower Leg o Return Appointment in 1 week. Edema Control Wound #1 Left,Posterior Lower Leg o 4-Layer Compression System - Left Lower Extremity o Elevate legs to the level of the heart and pump ankles as often as possible Additional Orders / Instructions Wound #1 Left,Posterior Lower Leg o Increase protein intake. o OK to return to work with the following restrictions: Steve Charles, Steve Charles ([id number]) o Activity as tolerated Electronic Signature(s) Signed: 04/29/2016 3:01:26 PM By: Regan Lemming BSN, RN Signed: 04/30/2016 12:38:30 PM By: Linton Ham MD Entered By: Regan Lemming on 04/29/2016 09:40:59 Steve Charles ([id number]) -------------------------------------------------------------------------------- Problem List Details Patient Name: Steve Charles Date of Service: 04/29/2016 8:45 AM Medical Record Patient Account Number: [id number] Number: Treating RN: Baruch Gouty, RN, BSN, Rita January 27, 1946 [phone] y.o. Other Clinician: Date of Birth/Sex: Male) Treating Advaith Lamarque, [place] Primary Care Physician: Carolann Littler Physician/Extender: G Referring Physician: SELF, REFERRED Weeks in Treatment: 0 Active Problems ICD-10 Encounter Code Description Active Date Diagnosis E11.622 Type 2 diabetes mellitus with other skin ulcer 04/29/2016 Yes I87.322 Chronic venous hypertension (idiopathic) with 04/29/2016 Yes inflammation of left lower extremity L97.221 Non-pressure chronic ulcer of left calf limited to 04/29/2016 Yes breakdown of skin Inactive Problems Resolved Problems Electronic Signature(s) Signed: 04/30/2016 12:38:30 PM By: Linton Ham MD Entered By: Linton Ham on 04/29/2016  09:47:49 Steve Charles, Steve Charles ([id number]) -------------------------------------------------------------------------------- Progress Note Details Patient Name: Steve Charles, Steve Charles Date of Service: 04/29/2016 8:45 AM Medical Record Patient Account Number: [id number] Number: Treating RN: Baruch Gouty, RN, BSN, Rita 1946-10-31 [phone] y.o. Other Clinician: Date of Birth/Sex: Male) Treating Steve Charles Primary Care Physician: Carolann Littler Physician/Extender: G Referring Physician: SELF, REFERRED Weeks in Treatment: 0 Subjective Chief Complaint Information obtained from Patient Patient is here for review of a traumatic wound in the setting of type 2 diabetes to his left posterior calf History of Present Illness (HPI) 04/29/16; this is a 70 year old man who is a type II diabetic on insulin. He was coming back into his home from a mailbox and fell traumatizing the posterior aspect of his left calf on  concrete. He went to see his primary physician and received an injection of antibiotics, Td vaccination, Silvadene cream. I believe he also went to an urgent care and gone on an oral antibiotic but I am not sure which one however he says he is on it for 10 days. He has been using a combination of Neosporin. He does not have a history of PAD or diabetic neuropathy although he states he has spinal cord stenosis resulting in some inguinal radicular symptoms. He apparently was seen in the wound care clinic in [place] predominantly related to what sounds like venous insufficiency wounds on the other leg some years ago. Wound History Patient presents with 1 open wound that has been present for approximately 1 weeks. Patient has been treating wound in the following manner: bactroban. Laboratory tests have not been performed in the last month. Patient reportedly has not tested positive for an antibiotic resistant organism. Patient reportedly has not tested positive for osteomyelitis. Patient reportedly has not had testing performed to evaluate circulation in the legs. Patient History Information obtained from Patient. Allergies Narcotics (Reaction: Hives) Family History Cancer - Father, Father, Diabetes - Paternal Grandparents, Paternal Grandparents, Kidney Disease - Mother, No family history of Heart Disease, Hereditary Spherocytosis, Hypertension, Lung Disease, Seizures, Stroke, Thyroid Problems, Tuberculosis. Social History ARLIS, GAYLER ([id number]) Former smoker, Marital Status - Married, Alcohol Use - Rarely, Drug Use - No History, Caffeine Use - Daily. Medical History Eyes Patient has history of Cataracts Ear/Nose/Mouth/Throat Denies history of Chronic sinus problems/congestion, Middle ear problems Hematologic/Lymphatic Patient has history of Anemia Respiratory Denies history of Aspiration, Asthma, Chronic Obstructive Pulmonary Disease (COPD), Pneumothorax, Sleep Apnea,  Tuberculosis Cardiovascular Patient has history of Hypertension Denies history of Angina, Arrhythmia, Congestive Heart Failure, Coronary Artery Disease, Deep Vein Thrombosis, Hypotension, Myocardial Infarction, Peripheral Arterial Disease, Peripheral Venous Disease, Phlebitis, Vasculitis Gastrointestinal Denies history of Cirrhosis , Colitis, Crohn s, Hepatitis A, Hepatitis B, Hepatitis C Endocrine Patient has history of Type II Diabetes Denies history of Type I Diabetes Genitourinary Denies history of End Stage Renal Disease Immunological Denies history of Lupus Erythematosus, Raynaud s, Scleroderma Integumentary (Skin) Denies history of History of Burn, History of pressure wounds Musculoskeletal Patient has history of Osteoarthritis Denies history of Gout, Rheumatoid Arthritis, Osteomyelitis Neurologic Denies history of Dementia, Neuropathy, Quadriplegia, Paraplegia, Seizure Disorder Oncologic Denies history of Received Chemotherapy, Received Radiation Psychiatric Denies history of Anorexia/bulimia, Confinement Anxiety Patient is treated with Insulin, Oral Agents. Blood sugar is not tested. Medical And Surgical History Notes Cardiovascular high cholesterol Review of Systems (ROS) Constitutional Symptoms (General Health) The patient has no complaints or symptoms. Eyes Complains or has symptoms of Glasses / Contacts. Ear/Nose/Mouth/Throat The patient has no complaints or symptoms. OLIVANDER, LANGHAM ([id number]) Hematologic/Lymphatic The  patient has no complaints or symptoms. Respiratory The patient has no complaints or symptoms. Cardiovascular Complains or has symptoms of LE edema. Gastrointestinal The patient has no complaints or symptoms. Endocrine The patient has no complaints or symptoms. Genitourinary The patient has no complaints or symptoms. Immunological The patient has no complaints or symptoms. Integumentary (Skin) Complains or has symptoms of Wounds,  Swelling. Musculoskeletal The patient has no complaints or symptoms. Neurologic The patient has no complaints or symptoms. Oncologic The patient has no complaints or symptoms. Psychiatric The patient has no complaints or symptoms. Objective Constitutional Sitting or standing Blood Pressure is within target range for patient.. Pulse regular and within target range for patient.Marland Kitchen Respirations regular, non-labored and within target range.. Temperature is normal and within the target range for the patient.. Vitals Time Taken: 8:52 AM, Height: 68 in, Source: Stated, Weight: 257 lbs, Source: Measured, BMI: 39.1, Temperature: 98.1 F, Pulse: 88 bpm, Respiratory Rate: 19 breaths/min, Blood Pressure: 118/57 mmHg. Eyes Conjunctivae clear. No discharge.. Cardiovascular Heart rhythm and rate regular, without murmur or gallop.. Pedal pulses palpable and strong bilaterally.. Edema present in both extremities. This is mild chronic venous insufficiency with hemosiderin deposition. Gastrointestinal (GI) Abdomen is soft and non-distended without masses or tenderness. Bowel sounds active in all quadrants.Steve Charles ([id number]) Lymphatic None palpable in the popliteal or inguinal areas. Neurological His vibration sense seemed intact. Psychiatric No evidence of depression, anxiety, or agitation. Calm, cooperative, and communicative. Appropriate interactions and affect.. General Notes: Wound exam; the wound is a fairly large wound on the posterior left calf however it is superficial. Already covered in a yellowish surface slough that was successfully debridement. There is no evidence of surrounding infection. Just above the actual wound the patient is noted swelling and really significant tenderness. There is warmth in this area but no obvious subdermal or dermal issues. I suspect he has a reasonably deep hematoma/bruise in this area. Fortunately the skin above this appears to be intact.  There is no evidence of a DVT or cellulitis Integumentary (Hair, Skin) Wound #1 status is Open. Original cause of wound was Shear/Friction. The wound is located on the Left,Posterior Lower Leg. The wound measures 7cm length x 3cm width x 0.1cm depth; 16.493cm^2 area and 1.649cm^3 volume. The wound is limited to skin breakdown. There is no tunneling or undermining noted. There is a medium amount of serosanguineous drainage noted. The wound margin is distinct with the outline attached to the wound base. There is large (67-100%) granulation within the wound bed. There is a small (1-33%) amount of necrotic tissue within the wound bed including Adherent Slough. The periwound skin appearance exhibited: Moist. The periwound skin appearance did not exhibit: Callus, Crepitus, Excoriation, Fluctuance, Friable, Induration, Localized Edema, Rash, Scarring, Dry/Scaly, Maceration, Atrophie Blanche, Cyanosis, Ecchymosis, Hemosiderin Staining, Mottled, Pallor, Rubor, Erythema. Periwound temperature was noted as No Abnormality. The periwound has tenderness on palpation. Assessment Active Problems ICD-10 E11.622 - Type 2 diabetes mellitus with other skin ulcer I87.322 - Chronic venous hypertension (idiopathic) with inflammation of left lower extremity L97.221 - Non-pressure chronic ulcer of left calf limited to breakdown of skin Procedures KIMANI, TITONE. ([id number]) Wound #1 Wound #1 is a Diabetic Wound/Ulcer of the Lower Extremity located on the Left,Posterior Lower Leg . There was a Skin/Subcutaneous Tissue Debridement [id number]) debridement with total area of 21 sq cm performed by Steve Dillon, MD. with the following instrument(s): Curette to remove Non- Viable tissue/material including Fibrin/Slough and Subcutaneous after achieving pain control using Lidocaine  4% Topical Solution. A time out was conducted prior to the start of the procedure. A Minimum amount of bleeding was controlled with  Pressure. The procedure was tolerated well with a pain level of 0 throughout and a pain level of 0 following the procedure. Post Debridement Measurements: 7cm length x 3cm width x 0.1cm depth; 1.649cm^3 volume. Post procedure Diagnosis Wound #1: Same as Pre-Procedure Plan Wound Cleansing: Wound #1 Left,Posterior Lower Leg: Cleanse wound with mild soap and water May shower with protection. No tub bath. Skin Barriers/Peri-Wound Care: Wound #1 Left,Posterior Lower Leg: Barrier cream Primary Wound Dressing: Prisma Ag Secondary Dressing: Wound #1 Left,Posterior Lower Leg: Dry Gauze Dressing Change Frequency: Wound #1 Left,Posterior Lower Leg: Change dressing every week Follow-up Appointments: Wound #1 Left,Posterior Lower Leg: Return Appointment in 1 week. Edema Control: Wound #1 Left,Posterior Lower Leg: 4-Layer Compression System - Left Lower Extremity Elevate legs to the level of the heart and pump ankles as often as possible Additional Orders / Instructions: Wound #1 Left,Posterior Lower Leg: Increase protein intake. OK to return to work with the following restrictions: Activity as tolerated Losh, Emilliano E. ([id number]) After successful debridement of the area the base of this looks reasonably clean. We applied Prisma hydrogel. Dry gauze under a Profore wrap. He already has a cast protector at home. The concern is not only about the wound but probably a sizable hematoma above the wound, compression will help with this. The area will need to be carefully inspected when he comes in next week. Electronic Signature(s) Signed: 04/30/2016 12:38:30 PM By: Linton Ham MD Entered By: Linton Ham on 04/29/2016 09:57:11 Steve Charles ([id number]) -------------------------------------------------------------------------------- ROS/PFSH Details Patient Name: Steve Charles Date of Service: 04/29/2016 8:45 AM Medical Record Patient Account Number:  [id number] Number: Treating RN: Baruch Gouty, RN, BSN, Rita [birth year] [phone] y.o. Other Clinician: Date of Birth/Sex: Male) Treating Sherin Murdoch, [place] Primary Care Physician: Carolann Littler Physician/Extender: G Referring Physician: SELF, REFERRED Weeks in Treatment: 0 Information Obtained From Patient Wound History Do you currently have one or more open woundso Yes How many open wounds do you currently haveo 1 Approximately how long have you had your woundso 1 weeks How have you been treating your wound(s) until nowo bactroban Has your wound(s) ever healed and then re-openedo No Have you had any lab work done in the past montho No Have you tested positive for an antibiotic resistant organism (MRSA, VRE)o No Have you tested positive for osteomyelitis (bone infection)o No Have you had any tests for circulation on your legso No Eyes Complaints and Symptoms: Positive for: Glasses / Contacts Medical History: Positive for: Cataracts Cardiovascular Complaints and Symptoms: Positive for: LE edema Medical History: Positive for: Hypertension Negative for: Angina; Arrhythmia; Congestive Heart Failure; Coronary Artery Disease; Deep Vein Thrombosis; Hypotension; Myocardial Infarction; Peripheral Arterial Disease; Peripheral Venous Disease; Phlebitis; Vasculitis Past Medical History Notes: high cholesterol Integumentary (Skin) Complaints and Symptoms: Positive for: Wounds; Swelling Medical History: CAINON, SHUBA ([id number]) Negative for: History of Burn; History of pressure wounds Constitutional Symptoms (General Health) Complaints and Symptoms: No Complaints or Symptoms Ear/Nose/Mouth/Throat Complaints and Symptoms: No Complaints or Symptoms Medical History: Negative for: Chronic sinus problems/congestion; Middle ear problems Hematologic/Lymphatic Complaints and Symptoms: No Complaints or Symptoms Medical History: Positive for: Anemia Respiratory Complaints and  Symptoms: No Complaints or Symptoms Medical History: Negative for: Aspiration; Asthma; Chronic Obstructive Pulmonary Disease (COPD); Pneumothorax; Sleep Apnea; Tuberculosis Gastrointestinal Complaints and Symptoms: No Complaints or Symptoms Medical History: Negative for: Cirrhosis ; Colitis; Crohnos; Hepatitis A;  Hepatitis B; Hepatitis C Endocrine Complaints and Symptoms: No Complaints or Symptoms Medical History: Positive for: Type II Diabetes Negative for: Type I Diabetes Time with diabetes: 19 years Treated with: Insulin, Oral agents JERREMY, HAVILAND ([id number]) Blood sugar tested every day: No Genitourinary Complaints and Symptoms: No Complaints or Symptoms Medical History: Negative for: End Stage Renal Disease Immunological Complaints and Symptoms: No Complaints or Symptoms Medical History: Negative for: Lupus Erythematosus; Raynaudos; Scleroderma Musculoskeletal Complaints and Symptoms: No Complaints or Symptoms Medical History: Positive for: Osteoarthritis Negative for: Gout; Rheumatoid Arthritis; Osteomyelitis Neurologic Complaints and Symptoms: No Complaints or Symptoms Medical History: Negative for: Dementia; Neuropathy; Quadriplegia; Paraplegia; Seizure Disorder Oncologic Complaints and Symptoms: No Complaints or Symptoms Medical History: Negative for: Received Chemotherapy; Received Radiation Psychiatric Complaints and Symptoms: No Complaints or Symptoms Medical History: Negative for: Anorexia/bulimia; Confinement Anxiety HBO Extended History Items RAIDYN, BIELINSKI ([id number]) Eyes: Cataracts Family and Social History Cancer: Yes - Father, Father; Diabetes: Yes - Paternal Grandparents, Paternal Grandparents; Heart Disease: No; Hereditary Spherocytosis: No; Hypertension: No; Kidney Disease: Yes - Mother; Lung Disease: No; Seizures: No; Stroke: No; Thyroid Problems: No; Tuberculosis: No; Former smoker; Marital Status - Married; Alcohol Use:  Rarely; Drug Use: No History; Caffeine Use: Daily; Financial Concerns: No; Food, Clothing or Shelter Needs: No; Support System Lacking: No; Transportation Concerns: No; Advanced Directives: No; Living Will: No Electronic Signature(s) Signed: 04/29/2016 3:01:26 PM By: Regan Lemming BSN, RN Signed: 04/30/2016 12:38:30 PM By: Linton Ham MD Entered By: Regan Lemming on 04/29/2016 08:52:10 Steve Charles ([id number]) -------------------------------------------------------------------------------- SuperBill Details Patient Name: Steve Charles Date of Service: 04/29/2016 Medical Record Patient Account Number: [id number] Number: Treating RN: Baruch Gouty, RN, BSN, Rita 03/23/1946 [phone] y.o. Other Clinician: Date of Birth/Sex: Male) Treating Datra Clary, [place] Primary Care Physician: Carolann Littler Physician/Extender: G Referring Physician: SELF, REFERRED Weeks in Treatment: 0 Diagnosis Coding ICD-10 Codes Code Description E11.622 Type 2 diabetes mellitus with other skin ulcer I87.322 Chronic venous hypertension (idiopathic) with inflammation of left lower extremity L97.221 Non-pressure chronic ulcer of left calf limited to breakdown of skin Facility Procedures CPT4 Code Description: [id number] [place] VISIT-LEV 3 EST PT Modifier: Quantity: 1 CPT4 Code Description: [id number] 11042 - DEB SUBQ TISSUE 20 SQ CM/< ICD-10 Description Diagnosis L97.221 Non-pressure chronic ulcer of left calf limited to Modifier: breakdown of ski Quantity: 1 n CPT4 Code Description: [id number] 11045 - DEB SUBQ TISS EA ADDL 20CM ICD-10 Description Diagnosis L97.221 Non-pressure chronic ulcer of left calf limited to Modifier: breakdown of ski Quantity: 1 n Physician Procedures CPT4 Code Description: [id number] [place] PHYS LEVEL 3 o NEW PT ICD-10 Description Diagnosis E11.622 Type 2 diabetes mellitus with other skin ulcer Modifier: Quantity: 1 CPT4 Code Description: [id number] 11042 - WC PHYS SUBQ TISS 20 SQ CM  ICD-10 Description Diagnosis L97.221 Non-pressure chronic ulcer of left calf limited to brea Modifier: kdown of ski Quantity: 1 n CPT4 Code Description: [id number] - WC PHYS SUBQ TISS EA ADDL 20 CM HADDON, LANZO ([id number]) Modifier: Quantity: 1 Electronic Signature(s) Signed: 04/30/2016 12:38:30 PM By: Linton Ham MD Entered By: Linton Ham on 04/29/2016 09:58:34

## 2016-04-30 NOTE — Progress Notes (Signed)
Steve Charles, Steve Charles ([id number]) Visit Report for 04/29/2016 Allergy List Details Patient Name: Steve Charles, Steve Charles Date of Service: 04/29/2016 8:45 AM Medical Record Number: [id number] Patient Account Number: [id number] Date of Birth/Sex: 1945/12/30 (70 y.o. Male) Treating RN: Baruch Gouty, RN, BSN, Velva Harman Primary Care Physician: Carolann Littler Other Clinician: Referring Physician: SELF, REFERRED Treating Physician/Extender: Ricard Dillon Weeks in Treatment: 0 Allergies Active Allergies Narcotics Reaction: Hives Allergy Notes Electronic Signature(s) Signed: 04/29/2016 3:01:26 PM By: Regan Lemming BSN, RN Entered By: Regan Lemming on 04/29/2016 08:47:23 Steve Charles ([id number]) -------------------------------------------------------------------------------- [place] Details Patient Name: Steve Charles Date of Service: 04/29/2016 8:45 AM Medical Record Number: [id number] Patient Account Number: [id number] Date of Birth/Sex: 01-12-1946 (70 y.o. Male) Treating RN: Baruch Gouty, RN, BSN, Velva Harman Primary Care Physician: Carolann Littler Other Clinician: Referring Physician: SELF, REFERRED Treating Physician/Extender: Tito Dine in Treatment: 0 Visit Information Patient Arrived: Ambulatory Arrival Time: 08:43 Accompanied By: self Transfer Assistance: None Patient Identification Verified: Yes Secondary Verification Process Yes Completed: Patient Requires Transmission- No Based Precautions: Patient Has Alerts: Yes Patient Alerts: Patient on Blood Thinner Aspirin A1c 6.8 Electronic Signature(s) Signed: 04/29/2016 3:01:26 PM By: Regan Lemming BSN, RN Entered By: Regan Lemming on 04/29/2016 09:09:39 Steve Charles ([id number]) -------------------------------------------------------------------------------- Clinic Level of Care Assessment Details Patient Name: Steve Charles Date of Service: 04/29/2016 8:45 AM Medical Record Number: [id number] Patient Account  Number: [id number] Date of Birth/Sex: 30-Dec-1945 (70 y.o. Male) Treating RN: Baruch Gouty, RN, BSN, Velva Harman Primary Care Physician: Carolann Littler Other Clinician: Referring Physician: SELF, REFERRED Treating Physician/Extender: Tito Dine in Treatment: 0 Clinic Level of Care Assessment Items TOOL 1 Quantity Score []  - Use when EandM and Procedure is performed on INITIAL visit 0 ASSESSMENTS - Nursing Assessment / Reassessment X - General Physical Exam (combine w/ comprehensive assessment (listed just 1 20 below) when performed on new pt. evals) X - Comprehensive Assessment (HX, ROS, Risk Assessments, Wounds Hx, etc.) 1 25 ASSESSMENTS - Wound and Skin Assessment / Reassessment []  - Dermatologic / Skin Assessment (not related to wound area) 0 ASSESSMENTS - Ostomy and/or Continence Assessment and Care []  - Incontinence Assessment and Management 0 []  - Ostomy Care Assessment and Management (repouching, etc.) 0 PROCESS - Coordination of Care X - Simple Patient / Family Education for ongoing care 1 15 []  - Complex (extensive) Patient / Family Education for ongoing care 0 X - Staff obtains Consents, Records, Test Results / Process Orders 1 10 []  - Staff telephones HHA, Nursing Homes / Clarify orders / etc 0 []  - Routine Transfer to another Facility (non-emergent condition) 0 []  - Routine Hospital Admission (non-emergent condition) 0 X - New Admissions / Biomedical engineer / Ordering NPWT, Apligraf, etc. 1 15 []  - Emergency Hospital Admission (emergent condition) 0 PROCESS - Special Needs []  - Pediatric / Minor Patient Management 0 []  - Isolation Patient Management 0 Steve Charles, STANBRO. ([id number]) []  - Hearing / Language / Visual special needs 0 []  - Assessment of Community assistance (transportation, D/C planning, etc.) 0 []  - Additional assistance / Altered mentation 0 []  - Support Surface(s) Assessment (bed, cushion, seat, etc.) 0 INTERVENTIONS - Miscellaneous []  - External  ear exam 0 []  - Patient Transfer (multiple staff / Civil Service fast streamer / Similar devices) 0 []  - Simple Staple / Suture removal (25 or less) 0 []  - Complex Staple / Suture removal (26 or more) 0 []  - Hypo/Hyperglycemic Management (do not check if billed separately) 0 X - Ankle / Brachial Index (ABI) -  do not check if billed separately 1 15 Has the patient been seen at the hospital within the last three years: Yes Total Score: 100 Level Of Care: New/Established - Level 3 Electronic Signature(s) Signed: 04/29/2016 3:01:26 PM By: Regan Lemming BSN, RN Entered By: Regan Lemming on 04/29/2016 09:23:10 Steve Charles ([id number]) -------------------------------------------------------------------------------- Encounter Discharge Information Details Patient Name: Steve Charles Date of Service: 04/29/2016 8:45 AM Medical Record Number: [id number] Patient Account Number: [id number] Date of Birth/Sex: Jan 04, 1946 (70 y.o. Male) Treating RN: Baruch Gouty, RN, BSN, Velva Harman Primary Care Physician: Carolann Littler Other Clinician: Referring Physician: SELF, REFERRED Treating Physician/Extender: Tito Dine in Treatment: 0 Encounter Discharge Information Items Discharge Pain Level: 0 Discharge Condition: Stable Ambulatory Status: Ambulatory Discharge Destination: Home Transportation: Private Auto Accompanied By: self Schedule Follow-up Appointment: No Medication Reconciliation completed and provided to Patient/Care No Steve Charles: Provided on Clinical Summary of Care: 04/29/2016 Form Type Recipient Paper Patient BS Electronic Signature(s) Signed: 04/29/2016 3:01:26 PM By: Regan Lemming BSN, RN Previous Signature: 04/29/2016 9:40:05 AM Version By: Ruthine Dose Entered By: Regan Lemming on 04/29/2016 09:40:32 Steve Charles ([id number]) -------------------------------------------------------------------------------- Lower Extremity Assessment Details Patient Name: Steve Charles Date of  Service: 04/29/2016 8:45 AM Medical Record Number: [id number] Patient Account Number: [id number] Date of Birth/Sex: 01/05/1946 (70 y.o. Male) Treating RN: Baruch Gouty, RN, BSN, [place] Primary Care Physician: Carolann Littler Other Clinician: Referring Physician: SELF, REFERRED Treating Physician/Extender: Ricard Dillon Weeks in Treatment: 0 Edema Assessment Assessed: [Left: No] [Right: No] E[Left: dema] [Right: :] Calf Left: Right: Point of Measurement: 36 cm From Medial Instep 43.5 cm 42.5 cm Ankle Left: Right: Point of Measurement: 8 cm From Medial Instep 29 cm 25 cm Vascular Assessment Claudication: Claudication Assessment [Left:None] [Right:None] Pulses: Posterior Tibial Palpable: [Left:Yes] [Right:Yes] Dorsalis Pedis Palpable: [Left:Yes] [Right:Yes] Extremity colors, hair growth, and conditions: Extremity Color: [Left:Mottled] [Right:Mottled] Hair Growth on Extremity: [Left:No] [Right:No] Temperature of Extremity: [Left:Warm] [Right:Warm] Capillary Refill: [Left:< 3 seconds] [Right:< 3 seconds] Blood Pressure: Brachial: [Left:118] [Right:115] Dorsalis Pedis: 114 [Left:Dorsalis Pedis: 120] Ankle: Posterior Tibial: 122 [Left:Posterior Tibial: 122 1.03] [Right:1.03] Toe Nail Assessment Left: Right: Thick: Yes Yes Discolored: No No Deformed: No No Improper Length and Hygiene: No No Steve Charles, Steve Charles ([id number]) Electronic Signature(s) Signed: 04/29/2016 3:01:26 PM By: Regan Lemming BSN, RN Entered By: Regan Lemming on 04/29/2016 09:08:49 Steve Charles ([id number]) -------------------------------------------------------------------------------- Multi Wound Chart Details Patient Name: Steve Charles Date of Service: 04/29/2016 8:45 AM Medical Record Number: [id number] Patient Account Number: [id number] Date of Birth/Sex: [birth year] (70 y.o. Male) Treating RN: Baruch Gouty, RN, BSN, Velva Harman Primary Care Physician: Carolann Littler Other Clinician: Referring Physician: SELF,  REFERRED Treating Physician/Extender: Ricard Dillon Weeks in Treatment: 0 Vital Signs Height(in): 68 Pulse(bpm): 88 Weight(lbs): 257 Blood Pressure 118/57 (mmHg): Body Mass Index(BMI): 39 Temperature(F): 98.1 Respiratory Rate 19 (breaths/min): Photos: [1:No Photos] [N/A:N/A] Wound Location: [1:Left Lower Leg - Posterior] [N/A:N/A] Wounding Event: [1:Shear/Friction] [N/A:N/A] Primary Etiology: [1:Diabetic Wound/Ulcer of the Lower Extremity] [N/A:N/A] Comorbid History: [1:Cataracts, Anemia, Hypertension, Type II Diabetes, Osteoarthritis] [N/A:N/A] Date Acquired: [1:04/15/2016] [N/A:N/A] Weeks of Treatment: [1:0] [N/A:N/A] Wound Status: [1:Open] [N/A:N/A] Measurements L x W x D 7x3x0.1 [N/A:N/A] (cm) Area (cm) : [1:16.493] [N/A:N/A] Volume (cm) : [1:1.649] [N/A:N/A] % Reduction in Area: [1:0.00%] [N/A:N/A] % Reduction in Volume: 0.00% [N/A:N/A] Classification: [1:Grade 1] [N/A:N/A] Exudate Amount: [1:Medium] [N/A:N/A] Exudate Type: [1:Serosanguineous] [N/A:N/A] Exudate Color: [1:red, brown] [N/A:N/A] Wound Margin: [1:Distinct, outline attached] [N/A:N/A] Granulation Amount: [1:Large (67-100%)] [N/A:N/A] Necrotic Amount: [1:Small (1-33%)] [N/A:N/A] Exposed Structures: [1:Fascia: No Fat:  No Tendon: No Muscle: No Joint: No Bone: No] [N/A:N/A] Limited to Skin Breakdown Epithelialization: None N/A N/A Periwound Skin Texture: Edema: No N/A N/A Excoriation: No Induration: No Callus: No Crepitus: No Fluctuance: No Friable: No Rash: No Scarring: No Periwound Skin Moist: Yes N/A N/A Moisture: Maceration: No Dry/Scaly: No Periwound Skin Color: Atrophie Blanche: No N/A N/A Cyanosis: No Ecchymosis: No Erythema: No Hemosiderin Staining: No Mottled: No Pallor: No Rubor: No Temperature: No Abnormality N/A N/A Tenderness on Yes N/A N/A Palpation: Wound Preparation: Ulcer Cleansing: N/A N/A Rinsed/Irrigated with Saline Topical Anesthetic Applied: Other:  Lidocaine 4% Treatment Notes Electronic Signature(s) Signed: 04/29/2016 3:01:26 PM By: Regan Lemming BSN, RN Entered By: Regan Lemming on 04/29/2016 09:15:25 Steve Charles ([id number]) -------------------------------------------------------------------------------- Multi-Disciplinary Care Plan Details Patient Name: Steve Charles Date of Service: 04/29/2016 8:45 AM Medical Record Number: [id number] Patient Account Number: [id number] Date of Birth/Sex: [birth year] (70 y.o. Male) Treating RN: Baruch Gouty, RN, BSN, Velva Harman Primary Care Physician: Carolann Littler Other Clinician: Referring Physician: SELF, REFERRED Treating Physician/Extender: Ricard Dillon Weeks in Treatment: 0 Active Inactive Electronic Signature(s) Signed: 04/29/2016 3:01:26 PM By: Regan Lemming BSN, RN Entered By: Regan Lemming on 04/29/2016 09:15:11 Steve Charles ([id number]) -------------------------------------------------------------------------------- Pain Assessment Details Patient Name: Steve Charles Date of Service: 04/29/2016 8:45 AM Medical Record Number: [id number] Patient Account Number: [id number] Date of Birth/Sex: [birth year] (70 y.o. Male) Treating RN: Baruch Gouty, RN, BSN, Velva Harman Primary Care Physician: Carolann Littler Other Clinician: Referring Physician: SELF, REFERRED Treating Physician/Extender: Ricard Dillon Weeks in Treatment: 0 Active Problems Location of Pain Severity and Description of Pain Patient Has Paino No Site Locations With Dressing Change: No Pain Management and Medication Current Pain Management: Electronic Signature(s) Signed: 04/29/2016 3:01:26 PM By: Regan Lemming BSN, RN Entered By: Regan Lemming on 04/29/2016 08:44:09 Steve Charles ([id number]) -------------------------------------------------------------------------------- Patient/Caregiver Education Details Patient Name: Steve Charles Date of Service: 04/29/2016 8:45 AM Medical Record Number: [id number] Patient  Account Number: [id number] Date of Birth/Gender: [birth year] (70 y.o. Male) Treating RN: Baruch Gouty, RN, BSN, Velva Harman Primary Care Physician: Carolann Littler Other Clinician: Referring Physician: SELF, REFERRED Treating Physician/Extender: Tito Dine in Treatment: 0 Education Assessment Education Provided To: Patient Education Topics Provided Safety: Methods: Explain/Verbal Responses: State content correctly Venous: Welcome To [facility]: Methods: Explain/Verbal Responses: State content correctly Wound Debridement: Methods: Explain/Verbal Responses: State content correctly Wound/Skin Impairment: Methods: Explain/Verbal Responses: State content correctly Electronic Signature(s) Signed: 04/29/2016 4:15:47 PM By: Regan Lemming BSN, RN Entered By: Regan Lemming on 04/29/2016 16:06:29 Steve Charles ([id number]) -------------------------------------------------------------------------------- Wound Assessment Details Patient Name: Steve Charles Date of Service: 04/29/2016 8:45 AM Medical Record Number: [id number] Patient Account Number: [id number] Date of Birth/Sex: 08/13/1946 (70 y.o. Male) Treating RN: Afful, RN, BSN, [place] Primary Care Physician: Carolann Littler Other Clinician: Referring Physician: SELF, REFERRED Treating Physician/Extender: Ricard Dillon Weeks in Treatment: 0 Wound Status Wound Number: 1 Primary Diabetic Wound/Ulcer of the Lower Etiology: Extremity Wound Location: Left Lower Leg - Posterior Wound Open Wounding Event: Shear/Friction Status: Date Acquired: 04/15/2016 Comorbid Cataracts, Anemia, Hypertension, Weeks Of Treatment: 0 History: Type II Diabetes, Osteoarthritis Clustered Wound: No Photos Photo Uploaded By: Regan Lemming on 04/29/2016 15:01:01 Wound Measurements Length: (cm) 7 Width: (cm) 3 Depth: (cm) 0.1 Area: (cm) 16.493 Volume: (cm) 1.649 % Reduction in Area: 0% % Reduction in Volume: 0% Epithelialization:  None Tunneling: No Undermining: No Wound Description Classification: Grade 1 Wound Margin: Distinct, outline attached Exudate Amount: Medium Exudate Type: Serosanguineous Exudate Color: red, brown  Steve Charles, Steve Charles ([id number]) Foul Odor After Cleansing: No Wound Bed Granulation Amount: Large (67-100%) Exposed Structure Necrotic Amount: Small (1-33%) Fascia Exposed: No Necrotic Quality: Adherent Slough Fat Layer Exposed: No Tendon Exposed: No Muscle Exposed: No Joint Exposed: No Bone Exposed: No Limited to Skin Breakdown Periwound Skin Texture Texture Color No Abnormalities Noted: No No Abnormalities Noted: No Callus: No Atrophie Blanche: No Crepitus: No Cyanosis: No Excoriation: No Ecchymosis: No Fluctuance: No Erythema: No Friable: No Hemosiderin Staining: No Induration: No Mottled: No Localized Edema: No Pallor: No Rash: No Rubor: No Scarring: No Temperature / Pain Moisture Temperature: No Abnormality No Abnormalities Noted: No Tenderness on Palpation: Yes Dry / Scaly: No Maceration: No Moist: Yes Wound Preparation Ulcer Cleansing: Rinsed/Irrigated with Saline Topical Anesthetic Applied: Other: Lidocaine 4%, Treatment Notes Wound #1 (Left, Posterior Lower Leg) 1. Cleansed with: Cleanse wound with antibacterial soap and water 4. Dressing Applied: Prisma Ag 5. Secondary Dressing Applied ABD Pad Dry Gauze 7. Secured with 4-Layer Compression System - Left Lower Extremity Electronic Signature(s) Signed: 04/29/2016 3:01:26 PM By: Regan Lemming BSN, RN Steve Charles, Steve Charles ([id number]) Entered By: Regan Lemming on 04/29/2016 08:58:34 Steve Charles ([id number]) -------------------------------------------------------------------------------- Vitals Details Patient Name: Steve Charles Date of Service: 04/29/2016 8:45 AM Medical Record Number: [id number] Patient Account Number: [id number] Date of Birth/Sex: 04-01-1946 (70 y.o. Male) Treating RN: Afful,  RN, BSN, [place] Primary Care Physician: Carolann Littler Other Clinician: Referring Physician: SELF, REFERRED Treating Physician/Extender: Ricard Dillon Weeks in Treatment: 0 Vital Signs Time Taken: 08:52 Temperature (F): 98.1 Height (in): 68 Pulse (bpm): 88 Source: Stated Respiratory Rate (breaths/min): 19 Weight (lbs): 257 Blood Pressure (mmHg): 118/57 Source: Measured Reference Range: 80 - 120 mg / dl Body Mass Index (BMI): 39.1 Electronic Signature(s) Signed: 04/29/2016 3:01:26 PM By: Regan Lemming BSN, RN Entered By: Regan Lemming on 04/29/2016 08:54:35

## 2016-05-07 ENCOUNTER — Encounter: Payer: Medicare Other | Attending: Internal Medicine | Admitting: Internal Medicine

## 2016-05-07 DIAGNOSIS — Z87891 Personal history of nicotine dependence: Secondary | ICD-10-CM | POA: Insufficient documentation

## 2016-05-07 DIAGNOSIS — E11622 Type 2 diabetes mellitus with other skin ulcer: Secondary | ICD-10-CM | POA: Diagnosis not present

## 2016-05-07 DIAGNOSIS — I10 Essential (primary) hypertension: Secondary | ICD-10-CM | POA: Insufficient documentation

## 2016-05-07 DIAGNOSIS — E7800 Pure hypercholesterolemia, unspecified: Secondary | ICD-10-CM | POA: Insufficient documentation

## 2016-05-07 DIAGNOSIS — M1990 Unspecified osteoarthritis, unspecified site: Secondary | ICD-10-CM | POA: Insufficient documentation

## 2016-05-07 DIAGNOSIS — L97821 Non-pressure chronic ulcer of other part of left lower leg limited to breakdown of skin: Secondary | ICD-10-CM | POA: Diagnosis not present

## 2016-05-07 DIAGNOSIS — E11621 Type 2 diabetes mellitus with foot ulcer: Secondary | ICD-10-CM | POA: Diagnosis not present

## 2016-05-07 DIAGNOSIS — Z794 Long term (current) use of insulin: Secondary | ICD-10-CM | POA: Insufficient documentation

## 2016-05-07 DIAGNOSIS — L97221 Non-pressure chronic ulcer of left calf limited to breakdown of skin: Secondary | ICD-10-CM | POA: Diagnosis not present

## 2016-05-07 DIAGNOSIS — I87322 Chronic venous hypertension (idiopathic) with inflammation of left lower extremity: Secondary | ICD-10-CM | POA: Diagnosis not present

## 2016-05-07 NOTE — Progress Notes (Addendum)
Steve Charles, Steve Charles ([id number]) Visit Report for 05/07/2016 Arrival Information Details Patient Name: Steve Charles, Steve Charles Date of Service: 05/07/2016 8:00 AM Medical Record Number: [id number] Patient Account Number: [id number] Date of Birth/Sex: 07/08/1946 (70 y.o. Male) Treating Steve Charles: Baruch Gouty, Steve Charles, Steve Charles, Velva Harman Primary Care Physician: Carolann Littler Other Clinician: Referring Physician: Carolann Littler Treating Physician/Extender: Tito Dine in Treatment: 1 Visit Information History Since Last Visit Added or deleted any medications: No Patient Arrived: Ambulatory Any new allergies or adverse reactions: No Arrival Time: 08:08 Had a fall or experienced change in No Accompanied By: self activities of daily living that may affect Transfer Assistance: None risk of falls: Patient Identification Verified: Yes Signs or symptoms of abuse/neglect since last No Secondary Verification Process Yes visito Completed: Has Dressing in Place as Prescribed: Yes Patient Requires Transmission- No Has Compression in Place as Prescribed: Yes Based Precautions: Pain Present Now: No Patient Has Alerts: Yes Patient Alerts: Patient on Blood Thinner Aspirin A1c 6.8 Electronic Signature(s) Signed: 05/07/2016 8:12:03 AM By: Regan Lemming BSN, Steve Charles Entered By: Regan Lemming on 05/07/2016 08:12:03 Steve Charles ([id number]) -------------------------------------------------------------------------------- Encounter Discharge Information Details Patient Name: Steve Charles Date of Service: 05/07/2016 8:00 AM Medical Record Number: [id number] Patient Account Number: [id number] Date of Birth/Sex: Sep 06, 1946 (70 y.o. Male) Treating Steve Charles: Baruch Gouty, Steve Charles, Steve Charles, Velva Harman Primary Care Physician: Carolann Littler Other Clinician: Referring Physician: Carolann Littler Treating Physician/Extender: Tito Dine in Treatment: 1 Encounter Discharge Information Items Discharge Pain Level: 0 Discharge Condition:  Stable Ambulatory Status: Ambulatory Discharge Destination: Home Transportation: Private Auto Accompanied By: self Schedule Follow-up Appointment: No Medication Reconciliation completed and provided to Patient/Care No Steve Charles: Provided on Clinical Summary of Care: 05/07/2016 Form Type Recipient Paper Patient BS Electronic Signature(s) Signed: 05/07/2016 9:02:58 AM By: Regan Lemming BSN, Steve Charles Previous Signature: 05/07/2016 8:37:33 AM Version By: Ruthine Dose Entered By: Regan Lemming on 05/07/2016 09:02:58 Steve Charles ([id number]) -------------------------------------------------------------------------------- Lower Extremity Assessment Details Patient Name: Steve Charles Date of Service: 05/07/2016 8:00 AM Medical Record Number: [id number] Patient Account Number: [id number] Date of Birth/Sex: 02-Dec-1945 (70 y.o. Male) Treating Steve Charles: Baruch Gouty, Steve Charles, Steve Charles, Velva Harman Primary Care Physician: Carolann Littler Other Clinician: Referring Physician: Carolann Littler Treating Physician/Extender: Ricard Dillon Weeks in Treatment: 1 Edema Assessment Assessed: [Left: No] [Right: No] E[Left: dema] [Right: :] Calf Left: Right: Point of Measurement: 36 cm From Medial Instep 42 cm 42 cm Ankle Left: Right: Point of Measurement: 8 cm From Medial Instep 26.2 cm 25 cm Vascular Assessment Claudication: Claudication Assessment [Left:None] [Right:None] Pulses: Posterior Tibial Dorsalis Pedis Palpable: [Left:Yes] [Right:Yes] Extremity colors, hair growth, and conditions: Extremity Color: [Left:Mottled] [Right:Mottled] Hair Growth on Extremity: [Left:Yes] [Right:Yes] Temperature of Extremity: [Left:Warm] [Right:Warm] Capillary Refill: [Left:< 3 seconds] [Right:< 3 seconds] Electronic Signature(s) Signed: 05/07/2016 8:14:02 AM By: Regan Lemming BSN, Steve Charles Entered By: Regan Lemming on 05/07/2016 08:14:02 Steve Charles  ([id number]) -------------------------------------------------------------------------------- Multi Wound Chart Details Patient Name: Steve Charles Date of Service: 05/07/2016 8:00 AM Medical Record Number: [id number] Patient Account Number: [id number] Date of Birth/Sex: February 10, 1946 (70 y.o. Male) Treating Steve Charles: Baruch Gouty, Steve Charles, Steve Charles, Velva Harman Primary Care Physician: Carolann Littler Other Clinician: Referring Physician: Carolann Littler Treating Physician/Extender: Ricard Dillon Weeks in Treatment: 1 Vital Signs Height(in): 68 Pulse(bpm): 83 Weight(lbs): 257 Blood Pressure 121/60 (mmHg): Body Mass Index(BMI): 39 Temperature(F): 98 Respiratory Rate 18 (breaths/min): Photos: [1:No Photos] [N/A:N/A] Wound Location: [1:Left Lower Leg - Posterior] [N/A:N/A] Wounding Event: [1:Shear/Friction] [N/A:N/A] Primary Etiology: [1:Diabetic Wound/Ulcer of the Lower Extremity] [N/A:N/A] Comorbid History: [1:Cataracts, Anemia,  Hypertension, Type II Diabetes, Osteoarthritis] [N/A:N/A] Date Acquired: [1:04/15/2016] [N/A:N/A] Weeks of Treatment: [1:1] [N/A:N/A] Wound Status: [1:Open] [N/A:N/A] Measurements L x W x D 4x3x0.1 [N/A:N/A] (cm) Area (cm) : [1:9.425] [N/A:N/A] Volume (cm) : [1:0.942] [N/A:N/A] % Reduction in Area: [1:42.90%] [N/A:N/A] % Reduction in Volume: 42.90% [N/A:N/A] Classification: [1:Grade 1] [N/A:N/A] Exudate Amount: [1:Medium] [N/A:N/A] Exudate Type: [1:Serosanguineous] [N/A:N/A] Exudate Color: [1:red, brown] [N/A:N/A] Wound Margin: [1:Distinct, outline attached] [N/A:N/A] Granulation Amount: [1:Large (67-100%)] [N/A:N/A] Granulation Quality: [1:Pink, Pale] [N/A:N/A] Necrotic Amount: [1:None Present (0%)] [N/A:N/A] Exposed Structures: [1:Fascia: No Fat: No Tendon: No Muscle: No Joint: No] [N/A:N/A] Bone: No Limited to Skin Breakdown Epithelialization: Large (67-100%) N/A N/A Periwound Skin Texture: Edema: Yes N/A N/A Excoriation: No Induration: No Callus:  No Crepitus: No Fluctuance: No Friable: No Rash: No Scarring: No Periwound Skin Moist: Yes N/A N/A Moisture: Maceration: No Dry/Scaly: No Periwound Skin Color: Atrophie Blanche: No N/A N/A Cyanosis: No Ecchymosis: No Erythema: No Hemosiderin Staining: No Mottled: No Pallor: No Rubor: No Temperature: No Abnormality N/A N/A Tenderness on Yes N/A N/A Palpation: Wound Preparation: Ulcer Cleansing: N/A N/A Rinsed/Irrigated with Saline, Other: Surg Scrub and water Topical Anesthetic Applied: None Treatment Notes Electronic Signature(s) Signed: 05/07/2016 8:15:39 AM By: Regan Lemming BSN, Steve Charles Entered By: Regan Lemming on 05/07/2016 08:15:39 Steve Charles ([id number]) -------------------------------------------------------------------------------- Multi-Disciplinary Care Plan Details Patient Name: Steve Charles Date of Service: 05/07/2016 8:00 AM Medical Record Number: [id number] Patient Account Number: [id number] Date of Birth/Sex: Mar 12, 1946 (70 y.o. Male) Treating Steve Charles: Baruch Gouty, Steve Charles, Steve Charles, Velva Harman Primary Care Physician: Carolann Littler Other Clinician: Referring Physician: Carolann Littler Treating Physician/Extender: Tito Dine in Treatment: 1 Active Inactive Orientation to the Wound Care Program Nursing Diagnoses: Knowledge deficit related to the wound healing center program Goals: Patient/caregiver will verbalize understanding of the [facility] Date Initiated: 05/07/2016 Goal Status: Active Interventions: Provide education on orientation to the wound center Notes: Venous Leg Ulcer Nursing Diagnoses: Knowledge deficit related to disease process and management Potential for venous Insuffiency (use before diagnosis confirmed) Goals: Non-invasive venous studies are completed as ordered Date Initiated: 05/07/2016 Goal Status: Active Patient will maintain optimal edema control Date Initiated: 05/07/2016 Goal Status: Active Patient/caregiver  will verbalize understanding of disease process and disease management Date Initiated: 05/07/2016 Goal Status: Active Verify adequate tissue perfusion prior to therapeutic compression application Date Initiated: 05/07/2016 Goal Status: Active Interventions: Assess peripheral edema status every visit. Steve Charles, Steve Charles ([id number]) Compression as ordered Provide education on venous insufficiency Treatment Activities: Therapeutic compression applied : 05/07/2016 Notes: Wound/Skin Impairment Nursing Diagnoses: Impaired tissue integrity Knowledge deficit related to ulceration/compromised skin integrity Goals: Patient/caregiver will verbalize understanding of skin care regimen Date Initiated: 05/07/2016 Goal Status: Active Ulcer/skin breakdown will have a volume reduction of 30% by week 4 Date Initiated: 05/07/2016 Goal Status: Active Ulcer/skin breakdown will have a volume reduction of 50% by week 8 Date Initiated: 05/07/2016 Goal Status: Active Ulcer/skin breakdown will have a volume reduction of 80% by week 12 Date Initiated: 05/07/2016 Goal Status: Active Ulcer/skin breakdown will heal within 14 weeks Date Initiated: 05/07/2016 Goal Status: Active Interventions: Assess patient/caregiver ability to obtain necessary supplies Assess patient/caregiver ability to perform ulcer/skin care regimen upon admission and as needed Assess ulceration(s) every visit Provide education on ulcer and skin care Treatment Activities: Skin care regimen initiated : 05/07/2016 Topical wound management initiated : 05/07/2016 Notes: Electronic Signature(s) Signed: 05/07/2016 8:15:31 AM By: Regan Lemming BSN, Steve Charles Steve Charles, Steve Charles ([id number]) Entered By: Regan Lemming on 05/07/2016 08:15:31 Amalia Greenhouse  EMarland Kitchen ([id number]) -------------------------------------------------------------------------------- Pain Assessment Details Patient Name: Steve Charles, Steve Charles Date of Service: 05/07/2016 8:00 AM Medical Record Number:  [id number] Patient Account Number: [id number] Date of Birth/Sex: 06-05-1946 (70 y.o. Male) Treating Steve Charles: Baruch Gouty, Steve Charles, Steve Charles, [place] Primary Care Physician: Carolann Littler Other Clinician: Referring Physician: Carolann Littler Treating Physician/Extender: Ricard Dillon Weeks in Treatment: 1 Active Problems Location of Pain Severity and Description of Pain Patient Has Paino No Site Locations With Dressing Change: No Pain Management and Medication Current Pain Management: Electronic Signature(s) Signed: 05/07/2016 8:12:15 AM By: Regan Lemming BSN, Steve Charles Entered By: Regan Lemming on 05/07/2016 08:12:15 Steve Charles ([id number]) -------------------------------------------------------------------------------- Patient/Caregiver Education Details Patient Name: Steve Charles Date of Service: 05/07/2016 8:00 AM Medical Record Number: [id number] Patient Account Number: [id number] Date of Birth/Gender: November 14, 1945 (70 y.o. Male) Treating Steve Charles: Baruch Gouty, Steve Charles, Steve Charles, [place] Primary Care Physician: Carolann Littler Other Clinician: Referring Physician: Carolann Littler Treating Physician/Extender: Tito Dine in Treatment: 1 Education Assessment Education Provided To: Patient Education Topics Provided Basic Hygiene: Methods: Explain/Verbal Responses: State content correctly Venous: Methods: Explain/Verbal Responses: State content correctly Welcome To [facility]: Methods: Explain/Verbal Responses: State content correctly Wound/Skin Impairment: Methods: Explain/Verbal Responses: State content correctly Electronic Signature(s) Signed: 05/07/2016 5:42:05 PM By: Regan Lemming BSN, Steve Charles Entered By: Regan Lemming on 05/07/2016 09:03:22 Steve Charles ([id number]) -------------------------------------------------------------------------------- Wound Assessment Details Patient Name: Steve Charles Date of Service: 05/07/2016 8:00 AM Medical Record Number: [id number] Patient Account  Number: [id number] Date of Birth/Sex: 06-12-1946 (70 y.o. Male) Treating Steve Charles: [place], Steve Charles, Steve Charles, Steve Charles Primary Care Physician: Carolann Littler Other Clinician: Referring Physician: Carolann Littler Treating Physician/Extender: Ricard Dillon Weeks in Treatment: 1 Wound Status Wound Number: 1 Primary Diabetic Wound/Ulcer of the Lower Etiology: Extremity Wound Location: Left Lower Leg - Posterior Wound Open Wounding Event: Shear/Friction Status: Date Acquired: 04/15/2016 Comorbid Cataracts, Anemia, Hypertension, Weeks Of Treatment: 1 History: Type II Diabetes, Osteoarthritis Clustered Wound: No Photos Photo Uploaded By: Regan Lemming on 05/07/2016 17:35:32 Wound Measurements Length: (cm) 4 Width: (cm) 3 Depth: (cm) 0.1 Area: (cm) 9.425 Volume: (cm) 0.942 % Reduction in Area: 42.9% % Reduction in Volume: 42.9% Epithelialization: Large (67-100%) Tunneling: No Undermining: No Wound Description Classification: Grade 1 Wound Margin: Distinct, outline attached Exudate Amount: Medium Exudate Type: Serosanguineous Exudate Color: red, brown Steve Charles, [place]. ([id number]) Foul Odor After Cleansing: No Wound Bed Granulation Amount: Large (67-100%) Exposed Structure Granulation Quality: Pink, Pale Fascia Exposed: No Necrotic Amount: None Present (0%) Fat Layer Exposed: No Tendon Exposed: No Muscle Exposed: No Joint Exposed: No Bone Exposed: No Limited to Skin Breakdown Periwound Skin Texture Texture Color No Abnormalities Noted: No No Abnormalities Noted: No Callus: No Atrophie Blanche: No Crepitus: No Cyanosis: No Excoriation: No Ecchymosis: No Fluctuance: No Erythema: No Friable: No Hemosiderin Staining: No Induration: No Mottled: No Localized Edema: Yes Pallor: No Rash: No Rubor: No Scarring: No Temperature / Pain Moisture Temperature: No Abnormality No Abnormalities Noted: No Tenderness on Palpation: Yes Dry / Scaly: No Maceration: No Moist:  Yes Wound Preparation Ulcer Cleansing: Rinsed/Irrigated with Saline, Other: Surg Scrub and water, Topical Anesthetic Applied: None Treatment Notes Wound #1 (Left, Posterior Lower Leg) 1. Cleansed with: Cleanse wound with antibacterial soap and water 3. Peri-wound Care: Moisturizing lotion 4. Dressing Applied: Prisma Ag 5. Secondary Dressing Applied Dry Gauze 7. Secured with 4-Layer Compression System - Left Lower Extremity Electronic Signature(s) Steve Charles, Steve Charles ([id number]) Signed: 05/07/2016 5:42:05 PM By: Regan Lemming BSN, Steve Charles Entered By: Regan Lemming on 05/07/2016 08:14:46  Steve Charles, Steve Charles ([id number]) -------------------------------------------------------------------------------- Vitals Details Patient Name: Steve Charles Date of Service: 05/07/2016 8:00 AM Medical Record Number: [id number] Patient Account Number: [id number] Date of Birth/Sex: 1946-07-30 (70 y.o. Male) Treating Steve Charles: Baruch Gouty, Steve Charles, Steve Charles, Velva Harman Primary Care Physician: Carolann Littler Other Clinician: Referring Physician: Carolann Littler Treating Physician/Extender: Tito Dine in Treatment: 1 Vital Signs Time Taken: 08:12 Temperature (F): 98 Height (in): 68 Pulse (bpm): 83 Weight (lbs): 257 Respiratory Rate (breaths/min): 18 Body Mass Index (BMI): 39.1 Blood Pressure (mmHg): 121/60 Reference Range: 80 - 120 mg / dl Electronic Signature(s) Signed: 05/07/2016 8:13:04 AM By: Regan Lemming BSN, Steve Charles Entered By: Regan Lemming on 05/07/2016 08:13:04

## 2016-05-08 NOTE — Progress Notes (Signed)
JESTER, CRITTENDEN ([id number]) Visit Report for 05/07/2016 Chief Complaint Document Details Patient Name: Steve Charles Date of Service: 05/07/2016 8:00 AM Medical Record Patient Account Number: [id number] Number: Treating RN: Baruch Gouty, RN, BSN, Rita Dec 20, 1945 [phone] y.o. Other Clinician: Date of Birth/Sex: Male) Treating ROBSON, MICHAEL Primary Care Physician: Carolann Littler Physician/Extender: G Referring Physician: Sherlynn Carbon in Treatment: 1 Information Obtained from: Patient Chief Complaint Patient is here for review of a traumatic wound in the setting of type 2 diabetes to his left posterior calf Electronic Signature(s) Signed: 05/08/2016 7:30:59 AM By: Linton Ham MD Entered By: Linton Ham on 05/07/2016 08:37:15 Steve Charles ([id number]) -------------------------------------------------------------------------------- HPI Details Patient Name: Steve Charles Date of Service: 05/07/2016 8:00 AM Medical Record Patient Account Number: [id number] Number: Treating RN: Baruch Gouty, RN, BSN, Rita 02-16-1946 [phone] y.o. Other Clinician: Date of Birth/Sex: Male) Treating ROBSON, MICHAEL Primary Care Physician: Carolann Littler Physician/Extender: G Referring Physician: Sherlynn Carbon in Treatment: 1 History of Present Illness HPI Description: 04/29/16; this is a 70 year old man who is a type II diabetic on insulin. He was coming back into his home from a mailbox and fell traumatizing the posterior aspect of his left calf on concrete. He went to see his primary physician and received an injection of antibiotics, Td vaccination, Silvadene cream. I believe he also went to an urgent care and gone on an oral antibiotic but I am not sure which one however he says he is on it for 10 days. He has been using a combination of Neosporin. He does not have a history of PAD or diabetic neuropathy although he states he has spinal cord stenosis resulting in  some inguinal radicular symptoms. He apparently was seen in the wound care clinic in [place] predominantly related to what sounds like venous insufficiency wounds on the other leg some years ago. ABIs in this clinic were 1.03 bilaterally waveforms were multiphasic 05/07/16 the patient's left leg/posterior calf wound looks considerably better healthy wound which appears to have progressed towards healing in the last week. The hematoma that was concerning last week is also resolved. Patient states the pain got a lot better over the course of last weekend. Electronic Signature(s) Signed: 05/08/2016 7:30:59 AM By: Linton Ham MD Entered By: Linton Ham on 05/07/2016 08:38:16 Steve Charles ([id number]) -------------------------------------------------------------------------------- Physical Exam Details Patient Name: Steve Charles Date of Service: 05/07/2016 8:00 AM Medical Record Patient Account Number: [id number] Number: Treating RN: Baruch Gouty, RN, BSN, Rita 1946-09-11 [phone] y.o. Other Clinician: Date of Birth/Sex: Male) Treating ROBSON, MICHAEL Primary Care Physician: Carolann Littler Physician/Extender: G Referring Physician: Sherlynn Carbon in Treatment: 1 Constitutional Sitting or standing Blood Pressure is within target range for patient.. Pulse regular and within target range for patient.Marland Kitchen Respirations regular, non-labored and within target range.. Temperature is normal and within the target range for the patient.. No acute distress. Eyes Conjunctivae clear. No discharge.Marland Kitchen Respiratory Respiratory effort is easy and symmetric bilaterally. Rate is normal at rest and on room air.. Cardiovascular Pedal pulses palpable and strong bilaterally.. Edema is controlled. Integumentary (Hair, Skin) Wound on the posterior aspect of the calf much improved. No longer a palpable hematoma. Psychiatric No evidence of depression, anxiety, or agitation. Calm, cooperative,  and communicative. Appropriate interactions and affect.. Electronic Signature(s) Signed: 05/08/2016 7:30:59 AM By: Linton Ham MD Entered By: Linton Ham on 05/07/2016 08:40:04 Steve Charles ([id number]) -------------------------------------------------------------------------------- Physician Orders Details Patient Name: Steve Charles Date of Service: 05/07/2016 8:00 AM Medical Record Patient  Account Number: [id number] Number: Treating RN: Baruch Gouty, RN, BSN, Velva Harman 11-22-1945 [phone] y.o. Other Clinician: Date of Birth/Sex: Male) Treating ROBSON, MICHAEL Primary Care Physician: Carolann Littler Physician/Extender: G Referring Physician: Sherlynn Carbon in Treatment: 1 Verbal / Phone Orders: Yes Clinician: Afful, RN, BSN, Rita Read Back and Verified: Yes Diagnosis Coding Wound Cleansing Wound #1 Left,Posterior Lower Leg o Cleanse wound with mild soap and water o May shower with protection. o No tub bath. Skin Barriers/Peri-Wound Care Wound #1 Left,Posterior Lower Leg o Barrier cream Primary Wound Dressing o Prisma Ag Secondary Dressing Wound #1 Left,Posterior Lower Leg o Dry Gauze Dressing Change Frequency Wound #1 Left,Posterior Lower Leg o Change dressing every week Follow-up Appointments Wound #1 Left,Posterior Lower Leg o Return Appointment in 1 week. Edema Control Wound #1 Left,Posterior Lower Leg o 4-Layer Compression System - Left Lower Extremity o Patient to wear own Juxtalite/Juzo compression garment. - Order Juxtalite o Elevate legs to the level of the heart and pump ankles as often as possible Additional Orders / Instructions Wound #1 Left,Posterior Lower Leg o Increase protein intake. Steve Charles, Steve Charles ([id number]) o OK to return to work with the following restrictions: o Activity as tolerated Engineer, maintenance) Signed: 05/07/2016 8:35:37 AM By: Regan Lemming BSN, RN Signed: 05/08/2016 7:30:59 AM By:  Linton Ham MD Entered By: Regan Lemming on 05/07/2016 08:35:37 Steve Charles ([id number]) -------------------------------------------------------------------------------- Problem List Details Patient Name: Steve Charles Date of Service: 05/07/2016 8:00 AM Medical Record Patient Account Number: [id number] Number: Treating RN: Baruch Gouty, RN, BSN, Rita 10/26/1946 [phone] y.o. Other Clinician: Date of Birth/Sex: Male) Treating ROBSON, MICHAEL Primary Care Physician: Carolann Littler Physician/Extender: G Referring Physician: Sherlynn Carbon in Treatment: 1 Active Problems ICD-10 Encounter Code Description Active Date Diagnosis E11.622 Type 2 diabetes mellitus with other skin ulcer 04/29/2016 Yes I87.322 Chronic venous hypertension (idiopathic) with 04/29/2016 Yes inflammation of left lower extremity L97.221 Non-pressure chronic ulcer of left calf limited to 04/29/2016 Yes breakdown of skin Inactive Problems Resolved Problems Electronic Signature(s) Signed: 05/08/2016 7:30:59 AM By: Linton Ham MD Entered By: Linton Ham on 05/07/2016 08:36:55 Steve Charles ([id number]) -------------------------------------------------------------------------------- Progress Note Details Patient Name: Steve Charles Date of Service: 05/07/2016 8:00 AM Medical Record Patient Account Number: [id number] Number: Treating RN: Baruch Gouty, RN, BSN, Rita [birth year] [phone] y.o. Other Clinician: Date of Birth/Sex: Male) Treating ROBSON, MICHAEL Primary Care Physician: Carolann Littler Physician/Extender: G Referring Physician: Sherlynn Carbon in Treatment: 1 Subjective Chief Complaint Information obtained from Patient Patient is here for review of a traumatic wound in the setting of type 2 diabetes to his left posterior calf History of Present Illness (HPI) 04/29/16; this is a 70 year old man who is a type II diabetic on insulin. He was coming back into his  home from a mailbox and fell traumatizing the posterior aspect of his left calf on concrete. He went to see his primary physician and received an injection of antibiotics, Td vaccination, Silvadene cream. I believe he also went to an urgent care and gone on an oral antibiotic but I am not sure which one however he says he is on it for 10 days. He has been using a combination of Neosporin. He does not have a history of PAD or diabetic neuropathy although he states he has spinal cord stenosis resulting in some inguinal radicular symptoms. He apparently was seen in the wound care clinic in [place] predominantly related to what sounds like venous insufficiency wounds on the other leg some years ago. ABIs  in this clinic were 1.03 bilaterally waveforms were multiphasic 05/07/16 the patient's left leg/posterior calf wound looks considerably better healthy wound which appears to have progressed towards healing in the last week. The hematoma that was concerning last week is also resolved. Patient states the pain got a lot better over the course of last weekend. Objective Constitutional Sitting or standing Blood Pressure is within target range for patient.. Pulse regular and within target range for patient.Marland Kitchen Respirations regular, non-labored and within target range.. Temperature is normal and within the target range for the patient.. No acute distress. Vitals Time Taken: 8:12 AM, Height: 68 in, Weight: 257 lbs, BMI: 39.1, Temperature: 98 F, Pulse: 83 bpm, Respiratory Rate: 18 breaths/min, Blood Pressure: 121/60 mmHg. PAULINE, MITRO ([id number]) Eyes Conjunctivae clear. No discharge.Marland Kitchen Respiratory Respiratory effort is easy and symmetric bilaterally. Rate is normal at rest and on room air.. Cardiovascular Pedal pulses palpable and strong bilaterally.. Edema is controlled. Psychiatric No evidence of depression, anxiety, or agitation. Calm, cooperative, and communicative. Appropriate interactions  and affect.. Integumentary (Hair, Skin) Wound on the posterior aspect of the calf much improved. No longer a palpable hematoma. Wound #1 status is Open. Original cause of wound was Shear/Friction. The wound is located on the Left,Posterior Lower Leg. The wound measures 4cm length x 3cm width x 0.1cm depth; 9.425cm^2 area and 0.942cm^3 volume. The wound is limited to skin breakdown. There is no tunneling or undermining noted. There is a medium amount of serosanguineous drainage noted. The wound margin is distinct with the outline attached to the wound base. There is large (67-100%) pink, pale granulation within the wound bed. There is no necrotic tissue within the wound bed. The periwound skin appearance exhibited: Localized Edema, Moist. The periwound skin appearance did not exhibit: Callus, Crepitus, Excoriation, Fluctuance, Friable, Induration, Rash, Scarring, Dry/Scaly, Maceration, Atrophie Blanche, Cyanosis, Ecchymosis, Hemosiderin Staining, Mottled, Pallor, Rubor, Erythema. Periwound temperature was noted as No Abnormality. The periwound has tenderness on palpation. Assessment Active Problems ICD-10 E11.622 - Type 2 diabetes mellitus with other skin ulcer I87.322 - Chronic venous hypertension (idiopathic) with inflammation of left lower extremity L97.221 - Non-pressure chronic ulcer of left calf limited to breakdown of skin Plan Wound Cleansing: Wound #1 Left,Posterior Lower Leg: SALIL, BUCHER. ([id number]) Cleanse wound with mild soap and water May shower with protection. No tub bath. Skin Barriers/Peri-Wound Care: Wound #1 Left,Posterior Lower Leg: Barrier cream Primary Wound Dressing: Prisma Ag Secondary Dressing: Wound #1 Left,Posterior Lower Leg: Dry Gauze Dressing Change Frequency: Wound #1 Left,Posterior Lower Leg: Change dressing every week Follow-up Appointments: Wound #1 Left,Posterior Lower Leg: Return Appointment in 1 week. Edema Control: Wound #1  Left,Posterior Lower Leg: 4-Layer Compression System - Left Lower Extremity Patient to wear own Juxtalite/Juzo compression garment. - Order Juxtalite Elevate legs to the level of the heart and pump ankles as often as possible Additional Orders / Instructions: Wound #1 Left,Posterior Lower Leg: Increase protein intake. OK to return to work with the following restrictions: Activity as tolerated #1 continue with Prisma and a 4 layer compression. #2 we'll order juxtalite's I believe already. #3 hopefully resolved next week Electronic Signature(s) Signed: 05/08/2016 7:30:59 AM By: Linton Ham MD Entered By: Linton Ham on 05/07/2016 08:40:56 Steve Charles ([id number]) -------------------------------------------------------------------------------- [place] Details Patient Name: Steve Charles Date of Service: 05/07/2016 Medical Record Patient Account Number: [id number] Number: Treating RN: Baruch Gouty RN, BSN, Rita Mar 10, 1946 [phone] y.o. Other Clinician: Date of Birth/Sex: Male) Treating ROBSON, Powell Primary Care Physician: Carolann Littler Physician/Extender: Darnell Level  Referring Physician: Sherlynn Carbon in Treatment: 1 Diagnosis Coding ICD-10 Codes Code Description E11.622 Type 2 diabetes mellitus with other skin ulcer I87.322 Chronic venous hypertension (idiopathic) with inflammation of left lower extremity L97.221 Non-pressure chronic ulcer of left calf limited to breakdown of skin Facility Procedures CPT4: Description Modifier Quantity Code [id number] BILATERAL: Application of multi-layer venous compression 1 system; leg (below knee), including ankle and foot. Physician Procedures CPT4: Description Modifier Quantity Code [id number] - WC PHYS LEVEL 3 - EST PT 1 ICD-10 Description Diagnosis I87.322 Chronic venous hypertension (idiopathic) with inflammation of left lower extremity Electronic Signature(s) Signed: 05/07/2016 9:02:03 AM By: Regan Lemming BSN,  RN Signed: 05/08/2016 7:30:59 AM By: Linton Ham MD Entered By: Regan Lemming on 05/07/2016 09:02:03

## 2016-05-12 ENCOUNTER — Ambulatory Visit (INDEPENDENT_AMBULATORY_CARE_PROVIDER_SITE_OTHER): Payer: Medicare Other | Admitting: Family Medicine

## 2016-05-12 DIAGNOSIS — M542 Cervicalgia: Secondary | ICD-10-CM | POA: Diagnosis not present

## 2016-05-12 DIAGNOSIS — I2510 Atherosclerotic heart disease of native coronary artery without angina pectoris: Secondary | ICD-10-CM

## 2016-05-12 DIAGNOSIS — E1165 Type 2 diabetes mellitus with hyperglycemia: Secondary | ICD-10-CM | POA: Diagnosis not present

## 2016-05-12 DIAGNOSIS — E7800 Pure hypercholesterolemia, unspecified: Secondary | ICD-10-CM | POA: Diagnosis not present

## 2016-05-12 LAB — HEPATIC FUNCTION PANEL
ALK PHOS: 80 U/L (ref 25–125)
ALT: 26 U/L (ref 10–40)
AST: 14 U/L (ref 14–40)

## 2016-05-12 LAB — LIPID PANEL
Cholesterol: 129 mg/dL (ref 0–200)
HDL: 34 mg/dL — AB (ref 35–70)
LDL CALC: 79 mg/dL
LDL/HDL RATIO: 2.3
Triglycerides: 79 mg/dL (ref 40–160)

## 2016-05-12 LAB — BASIC METABOLIC PANEL
BUN: 16 mg/dL (ref 4–21)
CREATININE: 0.7 mg/dL (ref 0.6–1.3)
GLUCOSE: 117 mg/dL
Potassium: 4 mmol/L (ref 3.4–5.3)
Sodium: 142 mmol/L (ref 137–147)

## 2016-05-12 LAB — HEMOGLOBIN A1C: Hemoglobin A1C: 7

## 2016-05-12 NOTE — Progress Notes (Signed)
Subjective:    Patient ID: Steve Charles, male    DOB: [birth year], 70 y.o.   MRN: [id number]  HPI Patient seen with severe cervical neck pain which started about 4 days ago. He has long-standing history of severe degenerative arthritis of the cervical spine Most recent CT scan of the neck March 2016. This showed multilevel severe degenerative arthritis. Denies recent injury. He describes a severe sharp pain which occasionally radiates down both arms and is worse with neck flexion and extension. He denies any upper extremity numbness. Possibly some generalized weakness in both upper extremities. Already takes gabapentin 300 mg 3 times a day. Has taken ibuprofen 800 mg every 8 hours without much relief. Very little sleep last night. He has intolerance to multiple opioids.  Has been followed by neurosurgeon in the past. Tried calling their office last Wednesday but has not heard anything back yet. No chest pains. Pains consistently worse with movement but sometimes at rest also. No dyspnea.  Past Medical History  Diagnosis Date  . DYSLIPIDEMIA 11/29/2009  . HYPERTENSION, ESSENTIAL 11/29/2009  . URTICARIA 02/06/2010  . BPH (benign prostatic hypertrophy)   . Osteoarthritis   . DM (diabetes mellitus) ([place])   . OSA (obstructive sleep apnea) 03/26/2011    CPAP 9 cm H2O  . COPD (chronic obstructive pulmonary disease) ([place]) 05/01/2011    PFT 05/01/11>>FEV1 2.07(72%), FEV1% 65, TLC 6.79(111%), DLCO 69%, no BD  . CAD (coronary artery disease)   . SVT (supraventricular tachycardia) [facility])    Past Surgical History  Procedure Laterality Date  . Appendectomy  1963  . Spine surgery      spinal fusion - L4, L5  . Total knee arthroplasty  2007  . Transurethral resection of prostate      bph  . Nasal sinus surgery  1989  . Bilateral carpal tunnel release Bilateral     reports that he has been smoking Cigarettes.  He has a 50 pack-year smoking history. He has never used smokeless tobacco. He  reports that he drinks alcohol. He reports that he does not use illicit drugs. family history includes Arthritis in his other; Cancer in his father; Diabetes in his other; Prostate cancer in his brother. Allergies  Allergen Reactions  . Codeine Sulfate Hives  . Morphine And Related Hives    Oral morphine/ hives   . Propoxyphene N-Acetaminophen Hives  . Ramipril Hives  . Shellfish Allergy Hives      Review of Systems  Constitutional: Negative for fever and chills.  Respiratory: Negative for shortness of breath.   Cardiovascular: Negative for chest pain.  Musculoskeletal: Positive for neck pain.  Neurological: Positive for weakness. Negative for numbness.       Objective:   Physical Exam  Constitutional: He appears well-developed and well-nourished. No distress.  Neck:  Pain with neck flexion and extension and very limited lateral bend or rotation to either side secondary to pain.  Cardiovascular: Normal rate and regular rhythm.   Pulmonary/Chest: Effort normal and breath sounds normal. No respiratory distress. He has no wheezes. He has no rales.  Musculoskeletal: He exhibits no edema.  Neurological: He is alert. No cranial nerve deficit.  Trace reflex brachial radialis and biceps bilaterally. He has general weakness upper extremities versus poor effort secondary to pain throughout upper extremities.          Assessment & Plan:  Neck pain with known history of cervical spondylosis at multiple levels. Acute worsening about 4 days ago in absence of recent  injury. We are trying to get in touch with his neurosurgeon. May need follow-up MRI to further assess. We called office of Dr Steve Charles and they will try to get him worked in.  Steve Post MD [facility]

## 2016-05-12 NOTE — Progress Notes (Signed)
Pre visit review using our clinic review tool, if applicable. No additional management support is needed unless otherwise documented below in the visit note. 

## 2016-05-13 ENCOUNTER — Encounter: Payer: Medicare Other | Admitting: Nurse Practitioner

## 2016-05-13 DIAGNOSIS — L97221 Non-pressure chronic ulcer of left calf limited to breakdown of skin: Secondary | ICD-10-CM | POA: Diagnosis not present

## 2016-05-13 DIAGNOSIS — M4722 Other spondylosis with radiculopathy, cervical region: Secondary | ICD-10-CM | POA: Diagnosis not present

## 2016-05-13 DIAGNOSIS — M5030 Other cervical disc degeneration, unspecified cervical region: Secondary | ICD-10-CM | POA: Diagnosis not present

## 2016-05-13 DIAGNOSIS — Z6839 Body mass index (BMI) 39.0-39.9, adult: Secondary | ICD-10-CM | POA: Diagnosis not present

## 2016-05-13 DIAGNOSIS — Z794 Long term (current) use of insulin: Secondary | ICD-10-CM | POA: Diagnosis not present

## 2016-05-13 DIAGNOSIS — R29898 Other symptoms and signs involving the musculoskeletal system: Secondary | ICD-10-CM | POA: Diagnosis not present

## 2016-05-13 DIAGNOSIS — M542 Cervicalgia: Secondary | ICD-10-CM | POA: Diagnosis not present

## 2016-05-13 DIAGNOSIS — I10 Essential (primary) hypertension: Secondary | ICD-10-CM | POA: Diagnosis not present

## 2016-05-13 DIAGNOSIS — I87322 Chronic venous hypertension (idiopathic) with inflammation of left lower extremity: Secondary | ICD-10-CM | POA: Diagnosis not present

## 2016-05-13 DIAGNOSIS — M5412 Radiculopathy, cervical region: Secondary | ICD-10-CM | POA: Diagnosis not present

## 2016-05-13 DIAGNOSIS — Z87891 Personal history of nicotine dependence: Secondary | ICD-10-CM | POA: Diagnosis not present

## 2016-05-13 DIAGNOSIS — E11622 Type 2 diabetes mellitus with other skin ulcer: Secondary | ICD-10-CM | POA: Diagnosis not present

## 2016-05-14 NOTE — Progress Notes (Signed)
HAIVEN, BARTRAM ([id number]) Visit Report for 05/13/2016 Arrival Information Details Patient Name: Steve Charles, Steve Charles Date of Service: 05/13/2016 3:45 PM Medical Record Number: [id number] Patient Account Number: [id number] Date of Birth/Sex: November 25, 1945 (70 y.o. Male) Treating RN: Macarthur Critchley Primary Care Physician: Carolann Littler Other Clinician: Referring Physician: Carolann Littler Treating Physician/Extender: Loistine Chance in Treatment: 2 Visit Information History Since Last Visit All ordered tests and consults were completed: No Patient Arrived: Ambulatory Added or deleted any medications: No Arrival Time: 15:31 Any new allergies or adverse reactions: No Accompanied By: none Had a fall or experienced change in No Transfer Assistance: None activities of daily living that may affect Patient Identification Verified: Yes risk of falls: Secondary Verification Process Yes Signs or symptoms of abuse/neglect since last No Completed: visito Patient Requires Transmission- No Hospitalized since last visit: No Based Precautions: Has Compression in Place as Prescribed: Yes Patient Has Alerts: Yes Pain Present Now: No Patient Alerts: Patient on Blood Thinner Aspirin A1c 6.8 Electronic Signature(s) Signed: 05/13/2016 4:21:40 PM By: Rebecca Eaton, RN, Sendra Entered By: Rebecca Eaton RN, Sendra on 05/13/2016 15:32:10 Steve Charles ([id number]) -------------------------------------------------------------------------------- Encounter Discharge Information Details Patient Name: Steve Charles Date of Service: 05/13/2016 3:45 PM Medical Record Number: [id number] Patient Account Number: [id number] Date of Birth/Sex: 06-Aug-1946 (70 y.o. Male) Treating RN: Macarthur Critchley Primary Care Physician: Carolann Littler Other Clinician: Referring Physician: Carolann Littler Treating Physician/Extender: Loistine Chance in Treatment: 2 Encounter Discharge Information  Items Discharge Pain Level: 0 Discharge Condition: Stable Ambulatory Status: Ambulatory Discharge Destination: Home Transportation: Private Auto Schedule Follow-up Appointment: Yes Medication Reconciliation completed and provided to Patient/Care Yes Anntoinette Haefele: Provided on Clinical Summary of Care: 05/13/2016 Form Type Recipient Paper Patient BS Electronic Signature(s) Signed: 05/13/2016 4:21:40 PM By: Rebecca Eaton RN, Sendra Previous Signature: 05/13/2016 4:01:26 PM Version By: Ruthine Dose Entered By: Rebecca Eaton RN, Sendra on 05/13/2016 16:07:17 Steve Charles ([id number]) -------------------------------------------------------------------------------- Lower Extremity Assessment Details Patient Name: Steve Charles Date of Service: 05/13/2016 3:45 PM Medical Record Number: [id number] Patient Account Number: [id number] Date of Birth/Sex: 09-05-1946 (69 y.o. Male) Treating RN: Macarthur Critchley Primary Care Physician: Carolann Littler Other Clinician: Referring Physician: Carolann Littler Treating Physician/Extender: Loistine Chance in Treatment: 2 Edema Assessment Assessed: [Left: No] [Right: No] Edema: [Left: N] [Right: o] Vascular Assessment Pulses: Posterior Tibial Dorsalis Pedis Palpable: [Left:Yes] Extremity colors, hair growth, and conditions: Extremity Color: [Left:Normal] Hair Growth on Extremity: [Left:Yes] Temperature of Extremity: [Left:Warm] Capillary Refill: [Left:< 3 seconds] Toe Nail Assessment Left: Right: Thick: No Discolored: No Deformed: No Improper Length and Hygiene: No Electronic Signature(s) Signed: 05/13/2016 4:21:40 PM By: Rebecca Eaton, RN, Sendra Entered By: Rebecca Eaton RN, Sendra on 05/13/2016 15:38:43 Steve Charles ([id number]) -------------------------------------------------------------------------------- Pain Assessment Details Patient Name: Steve Charles Date of Service: 05/13/2016 3:45 PM Medical Record Number:  [id number] Patient Account Number: [id number] Date of Birth/Sex: 10-29-1946 (70 y.o. Male) Treating RN: Macarthur Critchley Primary Care Physician: Carolann Littler Other Clinician: Referring Physician: Carolann Littler Treating Physician/Extender: Loistine Chance in Treatment: 2 Active Problems Location of Pain Severity and Description of Pain Patient Has Paino No Site Locations Pain Management and Medication Current Pain Management: Electronic Signature(s) Signed: 05/13/2016 4:21:40 PM By: Rebecca Eaton, RN, Sendra Entered By: Rebecca Eaton RN, Sendra on 05/13/2016 15:32:16 Steve Charles ([id number]) -------------------------------------------------------------------------------- Patient/Caregiver Education Details Patient Name: Steve Charles Date of Service: 05/13/2016 3:45 PM Medical Record Number: [id number] Patient Account Number: [id number] Date of Birth/Gender: 04-15-1946 (70 y.o. Male) Treating RN: Macarthur Critchley Primary Care Physician: Carolann Littler Other Clinician:  Referring Physician: Carolann Littler Treating Physician/Extender: Loistine Chance in Treatment: 2 Education Assessment Education Provided To: Patient Education Topics Provided Venous: Handouts: Other: juzo wrap Methods: Demonstration, Explain/Verbal Responses: Return demonstration correctly, State content correctly Electronic Signature(s) Signed: 05/13/2016 4:21:40 PM By: Rebecca Eaton, RN, Sendra Entered By: Rebecca Eaton, RN, Sendra on 05/13/2016 16:07:37 Steve Charles ([id number]) -------------------------------------------------------------------------------- Wound Assessment Details Patient Name: Steve Charles Date of Service: 05/13/2016 3:45 PM Medical Record Number: [id number] Patient Account Number: [id number] Date of Birth/Sex: Dec 04, 1945 (70 y.o. Male) Treating RN: Macarthur Critchley Primary Care Physician: Carolann Littler Other Clinician: Referring Physician: Carolann Littler Treating Physician/Extender: Loistine Chance in Treatment: 2 Wound Status Wound Number: 1 Primary Diabetic Wound/Ulcer of the Lower Etiology: Extremity Wound Location: Left Lower Leg - Posterior Wound Open Wounding Event: Shear/Friction Status: Date Acquired: 04/15/2016 Comorbid Cataracts, Anemia, Hypertension, Weeks Of Treatment: 2 History: Type II Diabetes, Osteoarthritis Clustered Wound: No Photos Photo Uploaded By: Rebecca Eaton, RN, Roslynn Amble on 05/13/2016 16:06:12 Wound Measurements Length: (cm) 1 Width: (cm) 0.5 Depth: (cm) 0.1 Area: (cm) 0.393 Volume: (cm) 0.039 % Reduction in Area: 97.6% % Reduction in Volume: 97.6% Epithelialization: Large (67-100%) Tunneling: No Undermining: No Wound Description Classification: Grade 1 Wound Margin: Distinct, outline attache Exudate Amount: None Present Foul Odor After Cleansing: No d Wound Bed Granulation Amount: Large (67-100%) Exposed Structure Granulation Quality: Pink, Pale Fascia Exposed: No Necrotic Amount: None Present (0%) Fat Layer Exposed: No Tendon Exposed: No Muscle Exposed: No Joint Exposed: No Steve Charles, Steve E. ([id number]) Bone Exposed: No Limited to Skin Breakdown Periwound Skin Texture Texture Color No Abnormalities Noted: No No Abnormalities Noted: No Callus: No Atrophie Blanche: No Crepitus: No Cyanosis: No Excoriation: No Ecchymosis: No Fluctuance: No Erythema: No Friable: No Hemosiderin Staining: No Induration: No Mottled: No Localized Edema: No Pallor: No Rash: No Rubor: No Scarring: No Temperature / Pain Moisture Temperature: No Abnormality No Abnormalities Noted: No Dry / Scaly: Yes Maceration: No Moist: No Wound Preparation Ulcer Cleansing: Rinsed/Irrigated with Saline, Other: Surg Scrub and water, Topical Anesthetic Applied: None Treatment Notes Wound #1 (Left, Posterior Lower Leg) 1. Cleansed with: Clean wound with Normal Saline 4. Dressing Applied: Prisma  Ag 5. Secondary Dressing Applied Bordered Foam Dressing 7. Secured with Patient to wear own compression stockings Notes Juzo wrap Electronic Signature(s) Signed: 05/13/2016 4:21:40 PM By: Rebecca Eaton, RN, Sendra Entered By: Rebecca Eaton RN, Sendra on 05/13/2016 15:39:36 Steve Charles ([id number]) -------------------------------------------------------------------------------- [place] Details Patient Name: Steve Charles Date of Service: 05/13/2016 3:45 PM Medical Record Number: [id number] Patient Account Number: [id number] Date of Birth/Sex: 25-Aug-1946 (70 y.o. Male) Treating RN: Macarthur Critchley Primary Care Physician: Carolann Littler Other Clinician: Referring Physician: Carolann Littler Treating Physician/Extender: Loistine Chance in Treatment: 2 Vital Signs Time Taken: 15:30 Temperature (F): 98.1 Height (in): 68 Pulse (bpm): 71 Weight (lbs): 257 Blood Pressure (mmHg): 144/66 Body Mass Index (BMI): 39.1 Reference Range: 80 - 120 mg / dl Electronic Signature(s) Signed: 05/13/2016 4:21:40 PM By: Rebecca Eaton RN, Sendra Entered By: Rebecca Eaton RN, Sendra on 05/13/2016 15:33:08

## 2016-05-14 NOTE — Progress Notes (Signed)
CHANCELOR, LICHTENSTEIN ([id number]) Visit Report for 05/13/2016 Chief Complaint Document Details Patient Name: Steve Charles, Steve Charles Date of Service: 05/13/2016 3:45 PM Medical Record Number: [id number] Patient Account Number: [id number] Date of Birth/Sex: 1946/10/24 (70 y.o. Male) Treating RN: Macarthur Critchley Primary Care Physician: Carolann Littler Other Clinician: Referring Physician: Carolann Littler Treating Physician/Extender: Loistine Chance in Treatment: 2 Information Obtained from: Patient Chief Complaint Patient is here for review of a traumatic wound in the setting of type 2 diabetes to his left posterior calf Electronic Signature(s) Signed: 05/13/2016 5:44:16 PM By: Londell Moh FNP Entered By: Londell Moh on 05/13/2016 17:16:27 Steve Charles ([id number]) -------------------------------------------------------------------------------- Debridement Details Patient Name: Steve Charles Date of Service: 05/13/2016 3:45 PM Medical Record Number: [id number] Patient Account Number: [id number] Date of Birth/Sex: [birth year] (70 y.o. Male) Treating RN: Macarthur Critchley Primary Care Physician: Carolann Littler Other Clinician: Referring Physician: Carolann Littler Treating Physician/Extender: Loistine Chance in Treatment: 2 Debridement Performed for Wound #1 Left,Posterior Lower Leg Assessment: Performed By: Physician Londell Moh, NP Debridement: Debridement Pre-procedure Yes Verification/Time Out Taken: Start Time: 15:51 Level: Skin/Subcutaneous Tissue Total Area Debrided (L x 1 (cm) x 0.5 (cm) = 0.5 (cm) W): Instrument: Curette Bleeding: Minimum Hemostasis Achieved: Pressure End Time: 15:52 Procedural Pain: 0 Post Procedural Pain: 0 Response to Treatment: Procedure was tolerated well Post Debridement Measurements of Total Wound Length: (cm) 1 Width: (cm) 0.5 Depth: (cm) 0.1 Volume: (cm) 0.039 Post Procedure Diagnosis Same as  Pre-procedure Electronic Signature(s) Signed: 05/13/2016 4:21:40 PM By: Rebecca Eaton RN, Roslynn Amble Signed: 05/13/2016 5:44:16 PM By: Londell Moh FNP Entered By: Rebecca Eaton RN, Sendra on 05/13/2016 15:52:23 Steve Charles ([id number]) -------------------------------------------------------------------------------- HPI Details Patient Name: Steve Charles Date of Service: 05/13/2016 3:45 PM Medical Record Number: [id number] Patient Account Number: [id number] Date of Birth/Sex: [birth year] (70 y.o. Male) Treating RN: Macarthur Critchley Primary Care Physician: Carolann Littler Other Clinician: Referring Physician: Carolann Littler Treating Physician/Extender: Loistine Chance in Treatment: 2 History of Present Illness HPI Description: 04/29/16; this is a 70 year old man who is a type II diabetic on insulin. He was coming back into his home from a mailbox and fell traumatizing the posterior aspect of his left calf on concrete. He went to see his primary physician and received an injection of antibiotics, Td vaccination, Silvadene cream. I believe he also went to an urgent care and gone on an oral antibiotic but I am not sure which one however he says he is on it for 10 days. He has been using a combination of Neosporin. He does not have a history of PAD or diabetic neuropathy although he states he has spinal cord stenosis resulting in some inguinal radicular symptoms. He apparently was seen in the wound care clinic in [place] predominantly related to what sounds like venous insufficiency wounds on the other leg some years ago. ABIs in this clinic were 1.03 bilaterally waveforms were multiphasic 05/07/16 the patient's left leg/posterior calf wound looks considerably better healthy wound which appears to have progressed towards healing in the last week. The hematoma that was concerning last week is also resolved. Patient states the pain got a lot better over the course of last  weekend. 05/13/16:left lower leg wound has significantly improved from previous visit. he brought his compression garment today for instruction and application. he denies pain or discomfort associated with his wound no reports of systemic s/s of infection. Electronic Signature(s) Signed: 05/13/2016 5:44:16 PM By: Londell Moh FNP Entered By: Londell Moh on 05/13/2016 17:17:57 Steve Charles ([id number]) --------------------------------------------------------------------------------  Physical Exam Details Patient Name: Steve Charles Date of Service: 05/13/2016 3:45 PM Medical Record Number: [id number] Patient Account Number: [id number] Date of Birth/Sex: May 06, 1946 (70 y.o. Male) Treating RN: Macarthur Critchley Primary Care Physician: Carolann Littler Other Clinician: Referring Physician: Carolann Littler Treating Physician/Extender: Loistine Chance in Treatment: 2 Constitutional Patient's appearance is neat and clean. Appears in no acute distress. Well nourished and well developed.. Ears, Nose, Mouth, and Throat Patient can hear normal speaking tones without difficulty.Marland Kitchen Respiratory Respiratory effort is easy and symmetric bilaterally. Rate is normal at rest and on room air.. Cardiovascular no edema. Peripheral pulses strong and equal. Capillary refill < 3 seconds.. Integumentary (Hair, Skin) left lower leg wound with significant epithelialization. no induration or fluctuance. no foul odor.Marland Kitchen Psychiatric Judgement and insight intact.. Alert and oriented times 3.. Short and long term memory intact.. No evidence of depression, anxiety, or agitation. Calm, cooperative, and communicative. Appropriate interactions and affect.. Electronic Signature(s) Signed: 05/13/2016 5:44:16 PM By: Londell Moh FNP Entered By: Londell Moh on 05/13/2016 17:20:00 Steve Charles  ([id number]) -------------------------------------------------------------------------------- Physician Orders Details Patient Name: Steve Charles Date of Service: 05/13/2016 3:45 PM Medical Record Number: [id number] Patient Account Number: [id number] Date of Birth/Sex: 1946-11-02 (70 y.o. Male) Treating RN: Macarthur Critchley Primary Care Physician: Carolann Littler Other Clinician: Referring Physician: Carolann Littler Treating Physician/Extender: Loistine Chance in Treatment: 2 Verbal / Phone Orders: Yes Clinician: Macarthur Critchley Read Back and Verified: Yes Diagnosis Coding Wound Cleansing Wound #1 Left,Posterior Lower Leg o Cleanse wound with mild soap and water o May shower with protection. o No tub bath. Skin Barriers/Peri-Wound Care Wound #1 Left,Posterior Lower Leg o Barrier cream Primary Wound Dressing o Prisma Ag Secondary Dressing Wound #1 Left,Posterior Lower Leg o Boardered Foam Dressing Dressing Change Frequency Wound #1 Left,Posterior Lower Leg o Change dressing every other day. Follow-up Appointments Wound #1 Left,Posterior Lower Leg o Return Appointment in 1 week. Edema Control Wound #1 Left,Posterior Lower Leg o Patient to wear own Juxtalite/Juzo compression garment. o Elevate legs to the level of the heart and pump ankles as often as possible Additional Orders / Instructions Wound #1 Left,Posterior Lower Leg o Increase protein intake. o OK to return to work with the following restrictions: o Activity as tolerated MALACHAI, INGERSOLL ([id number]) Electronic Signature(s) Signed: 05/13/2016 4:21:40 PM By: Ardean Larsen Signed: 05/13/2016 5:44:16 PM By: Londell Moh FNP Entered By: Rebecca Eaton RN, Sendra on 05/13/2016 15:53:26 Steve Charles ([id number]) -------------------------------------------------------------------------------- Problem List Details Patient Name: Steve Charles Date of Service:  05/13/2016 3:45 PM Medical Record Number: [id number] Patient Account Number: [id number] Date of Birth/Sex: 03-14-1946 (70 y.o. 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 Male) Treating RN: Macarthur Critchley Primary Care Physician: Carolann Littler Other Clinician: Referring Physician: Carolann Littler Treating Physician/Extender: Loistine Chance in Treatment: 2  Subjective Chief Complaint Information obtained from Patient Patient is here for review of a traumatic wound in the setting of type 2 diabetes to his left posterior calf History of Present Illness (HPI) 04/29/16; this is a 70 year old man who is a type II diabetic on insulin. He was coming back into his home from a mailbox and fell traumatizing the posterior aspect of his left calf on concrete. He went to see his primary physician and received an injection of antibiotics, Td vaccination, Silvadene cream. I believe he also went to an urgent care and gone on an oral  antibiotic but I am not sure which one however he says he is on it for 10 days. He has been using a combination of Neosporin. He does not have a history of PAD or diabetic neuropathy although he states he has spinal cord stenosis resulting in some inguinal radicular symptoms. He apparently was seen in the wound care clinic in [place] predominantly related to what sounds like venous insufficiency wounds on the other leg some years ago. ABIs in this clinic were 1.03 bilaterally waveforms were multiphasic 05/07/16 the patient's left leg/posterior calf wound looks considerably better healthy wound which appears to have progressed towards healing in the last week. The hematoma that was concerning last week is also resolved. Patient states the pain got a lot better over the course of last weekend. 05/13/16:left lower leg wound has significantly improved from previous visit. he brought his compression garment today for instruction and application. he denies pain or discomfort associated with his wound no reports of systemic s/s of infection. Objective Constitutional Patient's appearance is neat and clean. Appears in no acute distress. Well nourished and well developed.. Vitals Time Taken: 3:30 PM, Height: 68 in, Weight: 257 lbs, BMI: 39.1, Temperature: 98.1 F, Pulse: 71 bpm, Blood Pressure: 144/66 mmHg. WILFRIDO, ZIZZO ([id number]) Ears, Nose, Mouth, and Throat Patient can hear normal speaking tones without difficulty.Marland Kitchen Respiratory Respiratory effort is easy and symmetric bilaterally. Rate is normal at rest and on room air.. Cardiovascular no edema. Peripheral pulses strong and equal. Capillary refill < 3 seconds.. Psychiatric Judgement and insight intact.. Alert and oriented times 3.. Short and long term memory intact.. No evidence of depression, anxiety, or agitation. Calm, cooperative, and communicative. Appropriate interactions and affect.. Integumentary (Hair, Skin) left lower leg  wound with significant epithelialization. no induration or fluctuance. no foul odor.. Wound #1 status is Open. Original cause of wound was Shear/Friction. The wound is located on the Left,Posterior Lower Leg. The wound measures 1cm length x 0.5cm width x 0.1cm depth; 0.393cm^2 area and 0.039cm^3 volume. The wound is limited to skin breakdown. There is no tunneling or undermining noted. There is a none present amount of drainage noted. The wound margin is distinct with the outline attached to the wound base. There is large (67-100%) pink, pale granulation within the wound bed. There is no necrotic tissue within the wound bed. The periwound skin appearance exhibited: Dry/Scaly. The periwound skin appearance did not exhibit: Callus, Crepitus, Excoriation, Fluctuance, Friable, Induration, Localized Edema, Rash, Scarring, Maceration, Moist, Atrophie Blanche, Cyanosis, Ecchymosis, Hemosiderin Staining, Mottled, Pallor, Rubor, Erythema. Periwound temperature was noted as No Abnormality. Assessment Active Problems ICD-10 E11.622 - Type 2 diabetes mellitus with other skin ulcer I87.322 - Chronic venous hypertension (idiopathic) with inflammation of left lower extremity L97.221 - Non-pressure chronic ulcer of left calf limited to breakdown of skin Procedures Wound #1 RAYMAN, HOEPNER. ([id number]) Wound #1 is a Diabetic Wound/Ulcer of the Lower Extremity located  on the Left,Posterior Lower Leg . There was a Skin/Subcutaneous Tissue Debridement [id number]) debridement with total area of 0.5 sq cm performed by Londell Moh, NP. with the following instrument(s): Curette. A time out was conducted prior to the start of the procedure. A Minimum amount of bleeding was controlled with Pressure. The procedure was tolerated well with a pain level of 0 throughout and a pain level of 0 following the procedure. Post Debridement Measurements: 1cm length x 0.5cm width x 0.1cm depth; 0.039cm^3 volume. Post  procedure Diagnosis Wound #1: Same as Pre-Procedure good bleeding with debridment. 100% healthy red granulation tissue overlying wound bed s/p debridement. Plan Wound Cleansing: Wound #1 Left,Posterior Lower Leg: Cleanse wound with mild soap and water May shower with protection. No tub bath. Skin Barriers/Peri-Wound Care: Wound #1 Left,Posterior Lower Leg: Barrier cream Primary Wound Dressing: Prisma Ag Secondary Dressing: Wound #1 Left,Posterior Lower Leg: Boardered Foam Dressing Dressing Change Frequency: Wound #1 Left,Posterior Lower Leg: Change dressing every other day. Follow-up Appointments: Wound #1 Left,Posterior Lower Leg: Return Appointment in 1 week. Edema Control: Wound #1 Left,Posterior Lower Leg: Patient to wear own Juxtalite/Juzo compression garment. Elevate legs to the level of the heart and pump ankles as often as possible Additional Orders / Instructions: Wound #1 Left,Posterior Lower Leg: Increase protein intake. OK to return to work with the following restrictions: Activity as tolerated JAMONI, LARMORE ([id number]) Follow-Up Appointments: A follow-up appointment should be scheduled. Medication Reconciliation completed and provided to Patient/Care Provider. A Patient Clinical Summary of Care was provided to [facility] Electronic Signature(s) Signed: 05/13/2016 5:44:16 PM By: Londell Moh FNP Entered By: Londell Moh on 05/13/2016 17:20:37 Steve Charles ([id number]) -------------------------------------------------------------------------------- [place] Details Patient Name: Steve Charles Date of Service: 05/13/2016 Medical Record Number: [id number] Patient Account Number: [id number] Date of Birth/Sex: Mar 24, 1946 (70 y.o. Male) Treating RN: Macarthur Critchley Primary Care Physician: Carolann Littler Other Clinician: Referring Physician: Carolann Littler Treating Physician/Extender: Loistine Chance in Treatment: 2 Diagnosis  Coding ICD-10 Codes Code Description E11.622 Type 2 diabetes mellitus with other skin ulcer I87.322 Chronic venous hypertension (idiopathic) with inflammation of left lower extremity L97.221 Non-pressure chronic ulcer of left calf limited to breakdown of skin Facility Procedures CPT4 Code Description: [id number] - DEB SUBQ TISSUE 20 SQ CM/< ICD-10 Description Diagnosis L97.221 Non-pressure chronic ulcer of left calf limited to Modifier: breakdown of s Quantity: 1 kin Physician Procedures CPT4 Code Description: [id number] - WC PHYS SUBQ TISS 20 SQ CM ICD-10 Description Diagnosis L97.221 Non-pressure chronic ulcer of left calf limited to Modifier: breakdown of s Quantity: 1 kin Electronic Signature(s) Signed: 05/13/2016 5:44:16 PM By: Londell Moh FNP Entered By: Londell Moh on 05/13/2016 17:20:54

## 2016-05-15 ENCOUNTER — Encounter (HOSPITAL_BASED_OUTPATIENT_CLINIC_OR_DEPARTMENT_OTHER): Payer: Medicare Other

## 2016-05-15 DIAGNOSIS — E7800 Pure hypercholesterolemia, unspecified: Secondary | ICD-10-CM | POA: Diagnosis not present

## 2016-05-15 DIAGNOSIS — I10 Essential (primary) hypertension: Secondary | ICD-10-CM | POA: Diagnosis not present

## 2016-05-15 DIAGNOSIS — R609 Edema, unspecified: Secondary | ICD-10-CM | POA: Diagnosis not present

## 2016-05-15 DIAGNOSIS — M4722 Other spondylosis with radiculopathy, cervical region: Secondary | ICD-10-CM | POA: Diagnosis not present

## 2016-05-15 DIAGNOSIS — E1165 Type 2 diabetes mellitus with hyperglycemia: Secondary | ICD-10-CM | POA: Diagnosis not present

## 2016-05-15 DIAGNOSIS — M4802 Spinal stenosis, cervical region: Secondary | ICD-10-CM | POA: Diagnosis not present

## 2016-05-16 ENCOUNTER — Encounter: Payer: Self-pay | Admitting: Family Medicine

## 2016-05-20 ENCOUNTER — Encounter: Payer: Medicare Other | Admitting: Internal Medicine

## 2016-05-20 DIAGNOSIS — L97221 Non-pressure chronic ulcer of left calf limited to breakdown of skin: Secondary | ICD-10-CM | POA: Diagnosis not present

## 2016-05-20 DIAGNOSIS — I10 Essential (primary) hypertension: Secondary | ICD-10-CM | POA: Diagnosis not present

## 2016-05-20 DIAGNOSIS — E11622 Type 2 diabetes mellitus with other skin ulcer: Secondary | ICD-10-CM | POA: Diagnosis not present

## 2016-05-20 DIAGNOSIS — E11621 Type 2 diabetes mellitus with foot ulcer: Secondary | ICD-10-CM | POA: Diagnosis not present

## 2016-05-20 DIAGNOSIS — Z794 Long term (current) use of insulin: Secondary | ICD-10-CM | POA: Diagnosis not present

## 2016-05-20 DIAGNOSIS — L97821 Non-pressure chronic ulcer of other part of left lower leg limited to breakdown of skin: Secondary | ICD-10-CM | POA: Diagnosis not present

## 2016-05-20 DIAGNOSIS — Z87891 Personal history of nicotine dependence: Secondary | ICD-10-CM | POA: Diagnosis not present

## 2016-05-20 DIAGNOSIS — I87322 Chronic venous hypertension (idiopathic) with inflammation of left lower extremity: Secondary | ICD-10-CM | POA: Diagnosis not present

## 2016-05-21 ENCOUNTER — Other Ambulatory Visit: Payer: Self-pay | Admitting: Neurosurgery

## 2016-05-21 DIAGNOSIS — M4722 Other spondylosis with radiculopathy, cervical region: Secondary | ICD-10-CM | POA: Diagnosis not present

## 2016-05-21 DIAGNOSIS — M5020 Other cervical disc displacement, unspecified cervical region: Secondary | ICD-10-CM | POA: Diagnosis not present

## 2016-05-21 DIAGNOSIS — M542 Cervicalgia: Secondary | ICD-10-CM | POA: Diagnosis not present

## 2016-05-21 DIAGNOSIS — M5030 Other cervical disc degeneration, unspecified cervical region: Secondary | ICD-10-CM | POA: Diagnosis not present

## 2016-05-21 DIAGNOSIS — M5412 Radiculopathy, cervical region: Secondary | ICD-10-CM | POA: Diagnosis not present

## 2016-05-21 DIAGNOSIS — R51 Headache: Secondary | ICD-10-CM | POA: Diagnosis not present

## 2016-05-21 DIAGNOSIS — M4802 Spinal stenosis, cervical region: Secondary | ICD-10-CM | POA: Diagnosis not present

## 2016-05-21 NOTE — Procedures (Signed)
CPAP mask refitting done.

## 2016-05-21 NOTE — Progress Notes (Signed)
CPAP mask refit done in sleep lab.

## 2016-05-22 NOTE — Progress Notes (Signed)
HODARI, MULLALLY ([id number]) Visit Report for 05/20/2016 Chief Complaint Document Details Patient Name: Steve Charles, Steve Charles Date of Service: 05/20/2016 8:00 AM Medical Record Patient Account Number: [id number] Number: Treating RN: Ahmed Prima 15-Oct-1946 (70 y.o. Other Clinician: Date of Birth/Sex: Male) Treating Scott Fix Primary Care Physician: Carolann Littler Physician/Extender: G Referring Physician: Sherlynn Carbon in Treatment: 3 Information Obtained from: Patient Chief Complaint Patient is here for review of a traumatic wound in the setting of type 2 diabetes to his left posterior calf Electronic Signature(s) Signed: 05/21/2016 3:12:28 PM By: Linton Ham MD Entered By: Linton Ham on 05/20/2016 08:41:03 Steve Charles ([id number]) -------------------------------------------------------------------------------- Debridement Details Patient Name: Steve Charles Date of Service: 05/20/2016 8:00 AM Medical Record Patient Account Number: [id number] Number: Treating RN: Ahmed Prima 03/25/1946 (70 y.o. Other Clinician: Date of Birth/Sex: Male) Treating Vasilios [place]ta[place], [place] Primary Care Physician: Carolann Littler Physician/Extender: G Referring Physician: Sherlynn Carbon in Treatment: 3 Debridement Performed for Wound #1 Left,Posterior Lower Leg Assessment: Performed By: Physician Ricard Dillon, MD Debridement: Debridement Pre-procedure Yes Verification/Time Out Taken: Start Time: 08:26 Pain Control: Other : lidocaine 4% cream Level: Skin/Subcutaneous Tissue Total Area Debrided (L x 0.5 (cm) x 0.5 (cm) = 0.25 (cm) W): Tissue and other Viable, Non-Viable, Exudate, Fibrin/Slough, Subcutaneous material debrided: Instrument: Curette Bleeding: Minimum Hemostasis Achieved: Pressure End Time: 08:28 Procedural Pain: 0 Post Procedural Pain: 0 Response to Treatment: Procedure was tolerated well Post Debridement  Measurements of Total Wound Length: (cm) 0.5 Width: (cm) 0.5 Depth: (cm) 0.1 Volume: (cm) 0.02 Post Procedure Diagnosis Same as Pre-procedure Electronic Signature(s) Signed: 05/20/2016 4:00:07 PM By: Alric Quan Signed: 05/21/2016 3:12:28 PM By: Linton Ham MD Entered By: Linton Ham on 05/20/2016 08:40:48 Steve Charles ([id number]) -------------------------------------------------------------------------------- HPI Details Patient Name: Steve Charles Date of Service: 05/20/2016 8:00 AM Medical Record Patient Account Number: [id number] Number: Treating RN: Ahmed Prima Mar 18, 1946 (70 y.o. Other Clinician: Date of Birth/Sex: Male) Treating Tian Davison Primary Care Physician: Carolann Littler Physician/Extender: G Referring Physician: Sherlynn Carbon in Treatment: 3 History of Present Illness HPI Description: 04/29/16; this is a 70 year old man who is a type II diabetic on insulin. He was coming back into his home from a mailbox and fell traumatizing the posterior aspect of his left calf on concrete. He went to see his primary physician and received an injection of antibiotics, Td vaccination, Silvadene cream. I believe he also went to an urgent care and gone on an oral antibiotic but I am not sure which one however he says he is on it for 10 days. He has been using a combination of Neosporin. He does not have a history of PAD or diabetic neuropathy although he states he has spinal cord stenosis resulting in some inguinal radicular symptoms. He apparently was seen in the wound care clinic in [place] predominantly related to what sounds like venous insufficiency wounds on the other leg some years ago. ABIs in this clinic were 1.03 bilaterally waveforms were multiphasic 05/07/16 the patient's left leg/posterior calf wound looks considerably better healthy wound which appears to have progressed towards healing in the last week. The hematoma  that was concerning last week is also resolved. Patient states the pain got a lot better over the course of last weekend. 05/13/16:left lower leg wound has significantly improved from previous visit. he brought his compression garment today for instruction and application. he denies pain or discomfort associated with his wound no reports of systemic s/s of infection. 05/20/16; left  posterior leg wound which was initially trauma. Venous stasis and type 2 diabetes are concomitant issues. He does not have PAD Electronic Signature(s) Signed: 05/21/2016 3:12:28 PM By: Linton Ham MD Entered By: Linton Ham on 05/20/2016 08:41:43 Steve Charles ([id number]) -------------------------------------------------------------------------------- Physical Exam Details Patient Name: Steve Charles Date of Service: 05/20/2016 8:00 AM Medical Record Patient Account Number: [id number] Number: Treating RN: Ahmed Prima January 08, 1946 (70 y.o. Other Clinician: Date of Birth/Sex: Male) Treating Venola Castello Primary Care Physician: Carolann Littler Physician/Extender: G Referring Physician: Sherlynn Carbon in Treatment: 3 Notes The patient's wound was covered with Steri-Strip of black eschar. This was debrided and about 50% of this was already epithelialized. There is still is small residual open area Electronic Signature(s) Signed: 05/21/2016 3:12:28 PM By: Linton Ham MD Entered By: Linton Ham on 05/20/2016 08:42:37 Steve Charles ([id number]) -------------------------------------------------------------------------------- Physician Orders Details Patient Name: Steve Charles Date of Service: 05/20/2016 8:00 AM Medical Record Patient Account Number: [id number] Number: Treating RN: Ahmed Prima 04/25/1946 (70 y.o. Other Clinician: Date of Birth/Sex: Male) Treating Aala Ransom Primary Care Physician: Carolann Littler Physician/Extender:  G Referring Physician: Sherlynn Carbon in Treatment: 3 Verbal / Phone Orders: Yes Clinician: Pinkerton, Debi Read Back and Verified: Yes Diagnosis Coding Wound Cleansing Wound #1 Left,Posterior Lower Leg o Cleanse wound with mild soap and water o May shower with protection. o No tub bath. Skin Barriers/Peri-Wound Care Wound #1 Left,Posterior Lower Leg o Barrier cream Primary Wound Dressing o Prisma Ag Secondary Dressing Wound #1 Left,Posterior Lower Leg o Boardered Foam Dressing Dressing Change Frequency Wound #1 Left,Posterior Lower Leg o Change dressing every other day. Follow-up Appointments Wound #1 Left,Posterior Lower Leg o Return Appointment in 1 week. Edema Control Wound #1 Left,Posterior Lower Leg o Patient to wear own Juxtalite/Juzo compression garment. o Elevate legs to the level of the heart and pump ankles as often as possible Additional Orders / Instructions Wound #1 Left,Posterior Lower Leg o Increase protein intake. o OK to return to work with the following restrictions: CEON, GRISHABER ([id number]) o Activity as tolerated Electronic Signature(s) Signed: 05/20/2016 4:00:07 PM By: Alric Quan Signed: 05/21/2016 3:12:28 PM By: Linton Ham MD Entered By: Alric Quan on 05/20/2016 Deltana, [place] E. ([id number]) -------------------------------------------------------------------------------- Problem List Details Patient Name: Steve Charles Date of Service: 05/20/2016 8:00 AM Medical Record Patient Account Number: [id number] Number: Treating RN: Ahmed Prima [birth year] (70 y.o. 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 Other Clinician: Date of Birth/Sex: Male) Treating Lorayne Getchell Primary Care Physician: Carolann Littler Physician/Extender: G Referring Physician: Sherlynn Carbon in Treatment: 3 Subjective Chief Complaint Information obtained from Patient Patient is here for review of a traumatic wound in the setting of type 2 diabetes to his left posterior calf History of Present Illness (HPI)  04/29/16; this is a 70 year old man who is a type II diabetic on insulin. He was coming back into his home from a mailbox and fell traumatizing the posterior aspect of his left calf on concrete. He went to see his primary physician and received an injection of antibiotics, Td vaccination, Silvadene cream. I believe he also went to an urgent care and gone on an oral antibiotic but I am not sure which one however he says he is on it for 10 days. He has been using a combination of Neosporin. He does not have a history of PAD or diabetic neuropathy although he states he has spinal cord stenosis resulting in some inguinal radicular symptoms. He apparently was seen in the wound care clinic in [place] predominantly related to what sounds like venous insufficiency wounds on the other leg some years ago. ABIs in  this clinic were 1.03 bilaterally waveforms were multiphasic 05/07/16 the patient's left leg/posterior calf wound looks considerably better healthy wound which appears to have progressed towards healing in the last week. The hematoma that was concerning last week is also resolved. Patient states the pain got a lot better over the course of last weekend. 05/13/16:left lower leg wound has significantly improved from previous visit. he brought his compression garment today for instruction and application. he denies pain or discomfort associated with his wound no reports of systemic s/s of infection. 05/20/16; left posterior leg wound which was initially trauma. Venous stasis and type 2 diabetes are concomitant issues. He does not have PAD Objective Constitutional Vitals Time Taken: 8:09 AM, Height: 68 in, Weight: 257 lbs, BMI: 39.1, Temperature: 97.7 F, Pulse: 74 bpm, Respiratory Rate: 18 breaths/min, Blood Pressure: 125/61 mmHg. ERJON, KLUESNER ([id number]) Integumentary (Hair, Skin) Wound #1 status is Open. Original cause of wound was Shear/Friction. The wound is located on the Left,Posterior Lower Leg. The wound measures 0.5cm length x 0.5cm width x 0.1cm depth; 0.196cm^2 area and 0.02cm^3 volume. The wound is limited to skin breakdown. There is no tunneling or undermining noted. There is a small amount of serosanguineous drainage noted. The wound margin is distinct with the outline attached to the wound base. There is large (67-100%) pink, pale granulation within the wound bed. There is no necrotic tissue within the wound bed. The periwound skin appearance exhibited: Dry/Scaly. The periwound skin appearance did not exhibit: Callus, Crepitus, Excoriation, Fluctuance, Friable, Induration, Localized Edema, Rash, Scarring, Maceration, Moist, Atrophie Blanche, Cyanosis, Ecchymosis, Hemosiderin Staining, Mottled, Pallor, Rubor, Erythema. Periwound temperature was noted as  No Abnormality. Assessment Active Problems ICD-10 E11.622 - Type 2 diabetes mellitus with other skin ulcer I87.322 - Chronic venous hypertension (idiopathic) with inflammation of left lower extremity L97.221 - Non-pressure chronic ulcer of left calf limited to breakdown of skin Procedures Wound #1 Wound #1 is a Diabetic Wound/Ulcer of the Lower Extremity located on the Left,Posterior Lower Leg . There was a Skin/Subcutaneous Tissue Debridement [id number]) debridement with total area of 0.25 sq cm performed by Ricard Dillon, MD. with the following instrument(s): Curette to remove Viable and Non-Viable tissue/material including Exudate, Fibrin/Slough, and Subcutaneous after achieving pain control using Other (lidocaine 4% cream). A time out was conducted prior to the start of the procedure. A Minimum amount of bleeding was controlled with Pressure. The procedure was tolerated well with a pain level of 0 throughout and a pain level of 0 following the procedure. Post Debridement Measurements: 0.5cm length x 0.5cm width x 0.1cm depth; 0.02cm^3 volume. Post procedure Diagnosis Wound #1: Same as  Pre-Procedure BAYLEN, ALLUMS ([id number]) Plan Wound Cleansing: Wound #1 Left,Posterior Lower Leg: Cleanse wound with mild soap and water May shower with protection. No tub bath. Skin Barriers/Peri-Wound Care: Wound #1 Left,Posterior Lower Leg: Barrier cream Primary Wound Dressing: Prisma Ag Secondary Dressing: Wound #1 Left,Posterior Lower Leg: Boardered Foam Dressing Dressing Change Frequency: Wound #1 Left,Posterior Lower Leg: Change dressing every other day. Follow-up Appointments: Wound #1 Left,Posterior Lower Leg: Return Appointment in 1 week. Edema Control: Wound #1 Left,Posterior Lower Leg: Patient to wear own Juxtalite/Juzo compression garment. Elevate legs to the level of the heart and pump ankles as often as possible Additional Orders / Instructions: Wound #1  Left,Posterior Lower Leg: Increase protein intake. OK to return to work with the following restrictions: Activity as tolerated We will continue with prisma,border foam under the patient's own compression stockings. I am optomistic he will be heeled next week Electronic Signature(s) Signed: 05/21/2016 3:12:28 PM By: Linton Ham MD Entered By: Linton Ham on 05/20/2016 08:43:43 Steve Charles ([id number]) -------------------------------------------------------------------------------- [place] Details Patient Name: Steve Charles Date of Service: 05/20/2016 Medical Record Patient Account Number: [id number] Number: Treating RN: Ahmed Prima May 23, 1946 (70 y.o. Other Clinician: Date of Birth/Sex: Male) Treating Julizza Sass[place], [place] Primary Care Physician: Carolann Littler Physician/Extender: G Referring Physician: Sherlynn Carbon in Treatment: 3 Diagnosis Coding ICD-10 Codes Code Description E11.622 Type 2 diabetes mellitus with other skin ulcer I87.322 Chronic venous hypertension (idiopathic) with inflammation of left lower extremity L97.221 Non-pressure chronic ulcer of left calf limited to breakdown of skin Facility Procedures CPT4 Code Description: [id number] - DEB SUBQ TISSUE 20 SQ CM/< ICD-10 Description Diagnosis L97.221 Non-pressure chronic ulcer of left calf limited to Modifier: breakdown of ski Quantity: 1 n Physician Procedures CPT4 Code Description: [id number] - WC PHYS SUBQ TISS 20 SQ CM ICD-10 Description Diagnosis L97.221 Non-pressure chronic ulcer of left calf limited to Modifier: breakdown of ski Quantity: 1 n Electronic Signature(s) Signed: 05/21/2016 3:12:28 PM By: Linton Ham MD Entered By: Linton Ham on 05/20/2016 08:44:10

## 2016-05-22 NOTE — Progress Notes (Signed)
HAYLEY, DESHAIES ([id number]) Visit Report for 05/20/2016 Arrival Information Details Patient Name: Steve Charles, Steve Charles Date of Service: 05/20/2016 8:00 AM Medical Record Number: [id number] Patient Account Number: [id number] Date of Birth/Sex: July 21, 1946 (70 y.o. Male) Treating RN: Ahmed Prima Primary Care Physician: Carolann Littler Other Clinician: Referring Physician: Carolann Littler Treating Physician/Extender: Tito Dine in Treatment: 3 Visit Information History Since Last Visit All ordered tests and consults were completed: No Patient Arrived: Ambulatory Added or deleted any medications: No Arrival Time: 08:08 Any new allergies or adverse reactions: No Accompanied By: self Had a fall or experienced change in No Transfer Assistance: None activities of daily living that may affect Patient Identification Verified: Yes risk of falls: Secondary Verification Process Yes Signs or symptoms of abuse/neglect since last No Completed: visito Patient Requires Transmission- No Hospitalized since last visit: No Based Precautions: Pain Present Now: No Patient Has Alerts: Yes Patient Alerts: Patient on Blood Thinner Aspirin A1c 6.8 Electronic Signature(s) Signed: 05/20/2016 4:00:07 PM By: Alric Quan Entered By: Alric Quan on 05/20/2016 08:09:17 Steve Charles ([id number]) -------------------------------------------------------------------------------- Encounter Discharge Information Details Patient Name: Steve Charles Date of Service: 05/20/2016 8:00 AM Medical Record Number: [id number] Patient Account Number: [id number] Date of Birth/Sex: [birth year] (70 y.o. Male) Treating RN: Ahmed Prima Primary Care Physician: Carolann Littler Other Clinician: Referring Physician: Carolann Littler Treating Physician/Extender: Tito Dine in Treatment: 3 Encounter Discharge Information Items Discharge Pain Level: 0 Discharge Condition:  Stable Ambulatory Status: Ambulatory Discharge Destination: Home Transportation: Private Auto Accompanied By: self Schedule Follow-up Appointment: Yes Medication Reconciliation completed and provided to Patient/Care Yes Anubis Fundora: Provided on Clinical Summary of Care: 05/20/2016 Form Type Recipient Paper Patient BS Electronic Signature(s) Signed: 05/20/2016 8:39:36 AM By: Ruthine Dose Entered By: Ruthine Dose on 05/20/2016 08:39:35 Steve Charles ([id number]) -------------------------------------------------------------------------------- Lower Extremity Assessment Details Patient Name: Steve Charles Date of Service: 05/20/2016 8:00 AM Medical Record Number: [id number] Patient Account Number: [id number] Date of Birth/Sex: 02/14/1946 (70 y.o. Male) Treating RN: Ahmed Prima Primary Care Physician: Carolann Littler Other Clinician: Referring Physician: Carolann Littler Treating Physician/Extender: Ricard Dillon Weeks in Treatment: 3 Vascular Assessment Pulses: Posterior Tibial Dorsalis Pedis Palpable: [Left:Yes] Extremity colors, hair growth, and conditions: Extremity Color: [Left:Normal] Temperature of Extremity: [Left:Warm] Capillary Refill: [Left:< 3 seconds] Toe Nail Assessment Left: Right: Thick: No Discolored: No Deformed: No Improper Length and Hygiene: No Electronic Signature(s) Signed: 05/20/2016 4:00:07 PM By: Alric Quan Entered By: Alric Quan on 05/20/2016 08:12:35 Steve Charles ([id number]) -------------------------------------------------------------------------------- Multi Wound Chart Details Patient Name: Steve Charles Date of Service: 05/20/2016 8:00 AM Medical Record Number: [id number] Patient Account Number: [id number] Date of Birth/Sex: [birth year] (70 y.o. Male) Treating RN: Carolyne Fiscal, Debi Primary Care Physician: Carolann Littler Other Clinician: Referring Physician: Carolann Littler Treating Physician/Extender:  Ricard Dillon Weeks in Treatment: 3 Vital Signs Height(in): 68 Pulse(bpm): 74 Weight(lbs): 257 Blood Pressure 125/61 (mmHg): Body Mass Index(BMI): 39 Temperature(F): 97.7 Respiratory Rate 18 (breaths/min): Wound Assessments Treatment Notes Electronic Signature(s) Signed: 05/20/2016 4:00:07 PM By: Alric Quan Entered By: Alric Quan on 05/20/2016 08:18:16 Steve Charles ([id number]) -------------------------------------------------------------------------------- Multi-Disciplinary Care Plan Details Patient Name: Steve Charles Date of Service: 05/20/2016 8:00 AM Medical Record Number: [id number] Patient Account Number: [id number] Date of Birth/Sex: 12/14/1945 (70 y.o. Male) Treating RN: Ahmed Prima Primary Care Physician: Carolann Littler Other Clinician: Referring Physician: Carolann Littler Treating Physician/Extender: Tito Dine in Treatment: 3 Active Inactive Orientation to the Wound Care Program Nursing Diagnoses: Knowledge deficit related to the wound  healing center program Goals: Patient/caregiver will verbalize understanding of the Echo Date Initiated: 05/07/2016 Goal Status: Active Interventions: Provide education on orientation to the wound center Notes: Venous Leg Ulcer Nursing Diagnoses: Knowledge deficit related to disease process and management Potential for venous Insuffiency (use before diagnosis confirmed) Goals: Non-invasive venous studies are completed as ordered Date Initiated: 05/07/2016 Goal Status: Active Patient will maintain optimal edema control Date Initiated: 05/07/2016 Goal Status: Active Patient/caregiver will verbalize understanding of disease process and disease management Date Initiated: 05/07/2016 Goal Status: Active Verify adequate tissue perfusion prior to therapeutic compression application Date Initiated: 05/07/2016 Goal Status: Active Interventions: Assess peripheral  edema status every visit. Steve Charles, Steve Charles ([id number]) Compression as ordered Provide education on venous insufficiency Treatment Activities: Therapeutic compression applied : 05/07/2016 Notes: Wound/Skin Impairment Nursing Diagnoses: Impaired tissue integrity Knowledge deficit related to ulceration/compromised skin integrity Goals: Patient/caregiver will verbalize understanding of skin care regimen Date Initiated: 05/07/2016 Goal Status: Active Ulcer/skin breakdown will have a volume reduction of 30% by week 4 Date Initiated: 05/07/2016 Goal Status: Active Ulcer/skin breakdown will have a volume reduction of 50% by week 8 Date Initiated: 05/07/2016 Goal Status: Active Ulcer/skin breakdown will have a volume reduction of 80% by week 12 Date Initiated: 05/07/2016 Goal Status: Active Ulcer/skin breakdown will heal within 14 weeks Date Initiated: 05/07/2016 Goal Status: Active Interventions: Assess patient/caregiver ability to obtain necessary supplies Assess patient/caregiver ability to perform ulcer/skin care regimen upon admission and as needed Assess ulceration(s) every visit Provide education on ulcer and skin care Treatment Activities: Skin care regimen initiated : 05/07/2016 Topical wound management initiated : 05/07/2016 Notes: Electronic Signature(s) Signed: 05/20/2016 4:00:07 PM By: Neldon Newport ([id number]) Entered By: Alric Quan on 05/20/2016 08:29:55 Steve Charles ([id number]) -------------------------------------------------------------------------------- Pain Assessment Details Patient Name: Steve Charles Date of Service: 05/20/2016 8:00 AM Medical Record Number: [id number] Patient Account Number: [id number] Date of Birth/Sex: [birth year] (70 y.o. Male) Treating RN: Ahmed Prima Primary Care Physician: Carolann Littler Other Clinician: Referring Physician: Carolann Littler Treating Physician/Extender: Ricard Dillon Weeks in  Treatment: 3 Active Problems Location of Pain Severity and Description of Pain Patient Has Paino No Site Locations Pain Management and Medication Current Pain Management: Notes Topical or injectable lidocaine is offered to patient for acute pain when surgical debridement is performed. If needed, Patient is instructed to use over the counter pain medication for the following 24-48 hours after debridement. Wound care MDs do not prescribed pain medications. Electronic Signature(s) Signed: 05/20/2016 4:00:07 PM By: Alric Quan Entered By: Alric Quan on 05/20/2016 08:09:49 Steve Charles ([id number]) -------------------------------------------------------------------------------- Patient/Caregiver Education Details Patient Name: Steve Charles Date of Service: 05/20/2016 8:00 AM Medical Record Number: [id number] Patient Account Number: [id number] Date of Birth/Gender: [birth year] (69 y.o. Male) Treating RN: Ahmed Prima Primary Care Physician: Carolann Littler Other Clinician: Referring Physician: Carolann Littler Treating Physician/Extender: Tito Dine in Treatment: 3 Education Assessment Education Provided To: Patient Education Topics Provided Electronic Signature(s) Signed: 05/20/2016 4:00:07 PM By: Alric Quan Entered By: Alric Quan on 05/20/2016 08:18:46 Steve Charles ([id number]) -------------------------------------------------------------------------------- Wound Assessment Details Patient Name: Steve Charles Date of Service: 05/20/2016 8:00 AM Medical Record Number: [id number] Patient Account Number: [id number] Date of Birth/Sex: 15-Feb-1946 (70 y.o. Male) Treating RN: Carolyne Fiscal, Debi Primary Care Physician: Carolann Littler Other Clinician: Referring Physician: Carolann Littler Treating Physician/Extender: Ricard Dillon Weeks in Treatment: 3 Wound Status Wound Number: 1 Primary Diabetic Wound/Ulcer of the  Lower Etiology: Extremity Wound Location: Left Lower  Leg - Posterior Wound Open Wounding Event: Shear/Friction Status: Date Acquired: 04/15/2016 Comorbid Cataracts, Anemia, Hypertension, Weeks Of Treatment: 3 History: Type II Diabetes, Osteoarthritis Clustered Wound: No Photos Photo Uploaded By: Alric Quan on 05/20/2016 08:59:52 Wound Measurements Length: (cm) 0.5 Width: (cm) 0.5 Depth: (cm) 0.1 Area: (cm) 0.196 Volume: (cm) 0.02 % Reduction in Area: 98.8% % Reduction in Volume: 98.8% Epithelialization: Large (67-100%) Tunneling: No Undermining: No Wound Description Classification: Grade 1 Wound Margin: Distinct, outline attached Exudate Amount: Small Exudate Type: Serosanguineous Exudate Color: red, brown Foul Odor After Cleansing: No Wound Bed Granulation Amount: Large (67-100%) Exposed Structure Granulation Quality: Pink, Pale Fascia Exposed: No Necrotic Amount: None Present (0%) Fat Layer Exposed: No Tendon Exposed: No Steve Charles, Steve E. ([id number]) Muscle Exposed: No Joint Exposed: No Bone Exposed: No Limited to Skin Breakdown Periwound Skin Texture Texture Color No Abnormalities Noted: No No Abnormalities Noted: No Callus: No Atrophie Blanche: No Crepitus: No Cyanosis: No Excoriation: No Ecchymosis: No Fluctuance: No Erythema: No Friable: No Hemosiderin Staining: No Induration: No Mottled: No Localized Edema: No Pallor: No Rash: No Rubor: No Scarring: No Temperature / Pain Moisture Temperature: No Abnormality No Abnormalities Noted: No Dry / Scaly: Yes Maceration: No Moist: No Wound Preparation Ulcer Cleansing: Rinsed/Irrigated with Saline Topical Anesthetic Applied: None Treatment Notes Wound #1 (Left, Posterior Lower Leg) 1. Cleansed with: Clean wound with Normal Saline 4. Dressing Applied: Prisma Ag 5. Secondary Dressing Applied Bordered Foam Dressing Notes Juzo wrap Electronic Signature(s) Signed: 05/20/2016 4:00:07  PM By: Alric Quan Entered By: Alric Quan on 05/20/2016 08:29:48 Steve Charles ([id number]) -------------------------------------------------------------------------------- [place] Details Patient Name: Steve Charles Date of Service: 05/20/2016 8:00 AM Medical Record Number: [id number] Patient Account Number: [id number] Date of Birth/Sex: May 06, 1946 (70 y.o. Male) Treating RN: Carolyne Fiscal, Debi Primary Care Physician: Carolann Littler Other Clinician: Referring Physician: Carolann Littler Treating Physician/Extender: Tito Dine in Treatment: 3 Vital Signs Time Taken: 08:09 Temperature (F): 97.7 Height (in): 68 Pulse (bpm): 74 Weight (lbs): 257 Respiratory Rate (breaths/min): 18 Body Mass Index (BMI): 39.1 Blood Pressure (mmHg): 125/61 Reference Range: 80 - 120 mg / dl Electronic Signature(s) Signed: 05/20/2016 4:00:07 PM By: Alric Quan Entered By: Alric Quan on 05/20/2016 08:11:32

## 2016-05-27 ENCOUNTER — Encounter: Payer: Medicare Other | Admitting: Internal Medicine

## 2016-05-27 DIAGNOSIS — L97821 Non-pressure chronic ulcer of other part of left lower leg limited to breakdown of skin: Secondary | ICD-10-CM | POA: Diagnosis not present

## 2016-05-27 DIAGNOSIS — L97221 Non-pressure chronic ulcer of left calf limited to breakdown of skin: Secondary | ICD-10-CM | POA: Diagnosis not present

## 2016-05-27 DIAGNOSIS — Z09 Encounter for follow-up examination after completed treatment for conditions other than malignant neoplasm: Secondary | ICD-10-CM | POA: Diagnosis not present

## 2016-05-27 DIAGNOSIS — E11622 Type 2 diabetes mellitus with other skin ulcer: Secondary | ICD-10-CM | POA: Diagnosis not present

## 2016-05-27 DIAGNOSIS — Z872 Personal history of diseases of the skin and subcutaneous tissue: Secondary | ICD-10-CM | POA: Diagnosis not present

## 2016-05-27 DIAGNOSIS — I10 Essential (primary) hypertension: Secondary | ICD-10-CM | POA: Diagnosis not present

## 2016-05-27 DIAGNOSIS — Z794 Long term (current) use of insulin: Secondary | ICD-10-CM | POA: Diagnosis not present

## 2016-05-27 DIAGNOSIS — Z87891 Personal history of nicotine dependence: Secondary | ICD-10-CM | POA: Diagnosis not present

## 2016-05-27 DIAGNOSIS — I87322 Chronic venous hypertension (idiopathic) with inflammation of left lower extremity: Secondary | ICD-10-CM | POA: Diagnosis not present

## 2016-05-28 ENCOUNTER — Telehealth: Payer: Self-pay | Admitting: Cardiology

## 2016-05-28 NOTE — Progress Notes (Signed)
Steve Charles, Steve Charles ([id number]) Visit Report for 05/27/2016 Arrival Information Details Patient Name: Steve Charles, Steve Charles Date of Service: 05/27/2016 8:00 AM Medical Record Number: [id number] Patient Account Number: [id number] Date of Birth/Sex: Jun 13, 1946 (70 y.o. Male) Treating RN: Ahmed Prima Primary Care Physician: Carolann Littler Other Clinician: Referring Physician: Carolann Littler Treating Physician/Extender: Tito Dine in Treatment: 4 Visit Information History Since Last Visit All ordered tests and consults were completed: No Patient Arrived: Ambulatory Added or deleted any medications: No Arrival Time: 07:57 Any new allergies or adverse reactions: No Accompanied By: self Had a fall or experienced change in No Transfer Assistance: None activities of daily living that may affect Patient Identification Verified: Yes risk of falls: Secondary Verification Process Yes Signs or symptoms of abuse/neglect since last No Completed: visito Patient Requires Transmission- No Hospitalized since last visit: No Based Precautions: Pain Present Now: Yes Patient Has Alerts: Yes Patient Alerts: Patient on Blood Thinner Aspirin A1c 6.8 Electronic Signature(s) Signed: 05/27/2016 3:59:42 PM By: Alric Quan Entered By: Alric Quan on 05/27/2016 07:58:50 Steve Charles ([id number]) -------------------------------------------------------------------------------- Clinic Level of Care Assessment Details Patient Name: Steve Charles Date of Service: 05/27/2016 8:00 AM Medical Record Number: [id number] Patient Account Number: [id number] Date of Birth/Sex: [birth year] (70 y.o. Male) Treating RN: Carolyne Fiscal, Debi Primary Care Physician: Carolann Littler Other Clinician: Referring Physician: Carolann Littler Treating Physician/Extender: Tito Dine in Treatment: 4 Clinic Level of Care Assessment Items TOOL 4 Quantity Score X - Use when only an EandM  is performed on FOLLOW-UP visit 1 0 ASSESSMENTS - Nursing Assessment / Reassessment X - Reassessment of Co-morbidities (includes updates in patient status) 1 10 X - Reassessment of Adherence to Treatment Plan 1 5 ASSESSMENTS - Wound and Skin Assessment / Reassessment X - Simple Wound Assessment / Reassessment - one wound 1 5 []  - Complex Wound Assessment / Reassessment - multiple wounds 0 []  - Dermatologic / Skin Assessment (not related to wound area) 0 ASSESSMENTS - Focused Assessment []  - Circumferential Edema Measurements - multi extremities 0 []  - Nutritional Assessment / Counseling / Intervention 0 []  - Lower Extremity Assessment (monofilament, tuning fork, pulses) 0 []  - Peripheral Arterial Disease Assessment (using hand held doppler) 0 ASSESSMENTS - Ostomy and/or Continence Assessment and Care []  - Incontinence Assessment and Management 0 []  - Ostomy Care Assessment and Management (repouching, etc.) 0 PROCESS - Coordination of Care X - Simple Patient / Family Education for ongoing care 1 15 []  - Complex (extensive) Patient / Family Education for ongoing care 0 []  - Staff obtains Programmer, systems, Records, Test Results / Process Orders 0 []  - Staff telephones HHA, Nursing Homes / Clarify orders / etc 0 []  - Routine Transfer to another Facility (non-emergent condition) 0 Steve Charles, Steve Charles ([id number]) []  - Routine Hospital Admission (non-emergent condition) 0 []  - New Admissions / Biomedical engineer / Ordering NPWT, Apligraf, etc. 0 []  - Emergency Hospital Admission (emergent condition) 0 []  - Simple Discharge Coordination 0 X - Complex (extensive) Discharge Coordination 1 15 PROCESS - Special Needs []  - Pediatric / Minor Patient Management 0 []  - Isolation Patient Management 0 []  - Hearing / Language / Visual special needs 0 []  - Assessment of Community assistance (transportation, D/C planning, etc.) 0 []  - Additional assistance / Altered mentation 0 []  - Support Surface(s)  Assessment (bed, cushion, seat, etc.) 0 INTERVENTIONS - Wound Cleansing / Measurement X - Simple Wound Cleansing - one wound 1 5 []  - Complex Wound Cleansing - multiple wounds 0 X -  Wound Imaging (photographs - any number of wounds) 1 5 []  - Wound Tracing (instead of photographs) 0 []  - Simple Wound Measurement - one wound 0 []  - Complex Wound Measurement - multiple wounds 0 INTERVENTIONS - Wound Dressings []  - Small Wound Dressing one or multiple wounds 0 []  - Medium Wound Dressing one or multiple wounds 0 []  - Large Wound Dressing one or multiple wounds 0 []  - Application of Medications - topical 0 []  - Application of Medications - injection 0 INTERVENTIONS - Miscellaneous []  - External ear exam 0 Steve Charles, Steve Charles. ([id number]) []  - Specimen Collection (cultures, biopsies, blood, body fluids, etc.) 0 []  - Specimen(s) / Culture(s) sent or taken to Lab for analysis 0 []  - Patient Transfer (multiple staff / Harrel Lemon Lift / Similar devices) 0 []  - Simple Staple / Suture removal (25 or less) 0 []  - Complex Staple / Suture removal (26 or more) 0 []  - Hypo / Hyperglycemic Management (close monitor of Blood Glucose) 0 []  - Ankle / Brachial Index (ABI) - do not check if billed separately 0 X - Vital Signs 1 5 Has the patient been seen at the hospital within the last three years: Yes Total Score: 65 Level Of Care: New/Established - Level 2 Electronic Signature(s) Signed: 05/27/2016 3:59:42 PM By: Alric Quan Entered By: Alric Quan on 05/27/2016 08:22:22 Steve Charles ([id number]) -------------------------------------------------------------------------------- Encounter Discharge Information Details Patient Name: Steve Charles Date of Service: 05/27/2016 8:00 AM Medical Record Number: [id number] Patient Account Number: [id number] Date of Birth/Sex: 06/16/1946 (70 y.o. Male) Treating RN: Ahmed Prima Primary Care Physician: Carolann Littler Other Clinician: Referring  Physician: Carolann Littler Treating Physician/Extender: Tito Dine in Treatment: 4 Encounter Discharge Information Items Discharge Pain Level: 0 Discharge Condition: Stable Ambulatory Status: Ambulatory Discharge Destination: Home Transportation: Private Auto Accompanied By: self Schedule Follow-up Appointment: No Medication Reconciliation completed Yes and provided to Patient/Care Shykeria Sakamoto: Patient Clinical Summary of Care: Declined Electronic Signature(s) Signed: 05/27/2016 8:18:27 AM By: Ruthine Dose Entered By: Ruthine Dose on 05/27/2016 08:18:27 Steve Charles ([id number]) -------------------------------------------------------------------------------- Lower Extremity Assessment Details Patient Name: Steve Charles Date of Service: 05/27/2016 8:00 AM Medical Record Number: [id number] Patient Account Number: [id number] Date of Birth/Sex: 1946/05/12 (69 y.o. Male) Treating RN: Ahmed Prima Primary Care Physician: Carolann Littler Other Clinician: Referring Physician: Carolann Littler Treating Physician/Extender: Ricard Dillon Weeks in Treatment: 4 Vascular Assessment Pulses: Posterior Tibial Dorsalis Pedis Palpable: [Left:Yes] Extremity colors, hair growth, and conditions: Extremity Color: [Left:Normal] Temperature of Extremity: [Left:Warm] Capillary Refill: [Left:< 3 seconds] Toe Nail Assessment Left: Right: Thick: No Discolored: No Deformed: No Improper Length and Hygiene: No Electronic Signature(s) Signed: 05/27/2016 3:59:42 PM By: Alric Quan Entered By: Alric Quan on 05/27/2016 08:01:56 Steve Charles ([id number]) -------------------------------------------------------------------------------- Multi Wound Chart Details Patient Name: Steve Charles Date of Service: 05/27/2016 8:00 AM Medical Record Number: [id number] Patient Account Number: [id number] Date of Birth/Sex: 08-25-1946 (70 y.o. Male) Treating RN:  Carolyne Fiscal, Debi Primary Care Physician: Carolann Littler Other Clinician: Referring Physician: Carolann Littler Treating Physician/Extender: Ricard Dillon Weeks in Treatment: 4 Vital Signs Height(in): 68 Pulse(bpm): 81 Weight(lbs): 257 Blood Pressure 133/71 (mmHg): Body Mass Index(BMI): 39 Temperature(F): 97.8 Respiratory Rate 18 (breaths/min): Wound Assessments Treatment Notes Electronic Signature(s) Signed: 05/27/2016 3:59:42 PM By: Alric Quan Entered By: Alric Quan on 05/27/2016 08:07:32 Steve Charles ([id number]) -------------------------------------------------------------------------------- Multi-Disciplinary Care Plan Details Patient Name: Steve Charles Date of Service: 05/27/2016 8:00 AM Medical Record Number: [id number] Patient Account Number: [id number] Date of Birth/Sex:  [birth year] (70 y.o. Male) Treating RN: Carolyne Fiscal, Debi Primary Care Physician: Carolann Littler Other Clinician: Referring Physician: Carolann Littler Treating Physician/Extender: Ricard Dillon Weeks in Treatment: 4 Active Inactive Electronic Signature(s) Signed: 05/27/2016 3:59:42 PM By: Alric Quan Entered By: Alric Quan on 05/27/2016 08:21:49 Steve Charles ([id number]) -------------------------------------------------------------------------------- Pain Assessment Details Patient Name: Steve Charles Date of Service: 05/27/2016 8:00 AM Medical Record Number: [id number] Patient Account Number: [id number] Date of Birth/Sex: 1946/08/09 (70 y.o. Male) Treating RN: Ahmed Prima Primary Care Physician: Carolann Littler Other Clinician: Referring Physician: Carolann Littler Treating Physician/Extender: Ricard Dillon Weeks in Treatment: 4 Active Problems Location of Pain Severity and Description of Pain Patient Has Paino Yes Site Locations Pain Location: Generalized Pain With Dressing Change: No Duration of the Pain. Constant /  Intermittento Constant Rate the pain. Current Pain Level: 8 Worst Pain Level: 10 Least Pain Level: 4 Pain Management and Medication Current Pain Management: Notes pain in neck shoulders Electronic Signature(s) Signed: 05/27/2016 3:59:42 PM By: Alric Quan Entered By: Alric Quan on 05/27/2016 07:59:15 Steve Charles ([id number]) -------------------------------------------------------------------------------- Patient/Caregiver Education Details Patient Name: Steve Charles Date of Service: 05/27/2016 8:00 AM Medical Record Number: [id number] Patient Account Number: [id number] Date of Birth/Gender: [birth year] (70 y.o. Male) Treating RN: Ahmed Prima Primary Care Physician: Carolann Littler Other Clinician: Referring Physician: Carolann Littler Treating Physician/Extender: Tito Dine in Treatment: 4 Education Assessment Education Provided To: Patient Education Topics Provided Wound/Skin Impairment: Other: Keep area clean and dry. Please call the clinic if you have any questions or need Handouts: anything. Methods: Demonstration, Explain/Verbal Responses: State content correctly Electronic Signature(s) Signed: 05/27/2016 3:59:42 PM By: Alric Quan Entered By: Alric Quan on 05/27/2016 08:14:13 Steve Charles ([id number]) -------------------------------------------------------------------------------- Wound Assessment Details Patient Name: Steve Charles Date of Service: 05/27/2016 8:00 AM Medical Record Number: [id number] Patient Account Number: [id number] Date of Birth/Sex: Jun 04, 1946 (70 y.o. Male) Treating RN: Carolyne Fiscal, Debi Primary Care Physician: Carolann Littler Other Clinician: Referring Physician: Carolann Littler Treating Physician/Extender: Ricard Dillon Weeks in Treatment: 4 Wound Status Wound Number: 1 Primary Diabetic Wound/Ulcer of the Lower Etiology: Extremity Wound Location: Left, Posterior Lower  Leg Wound Status: Healed - Epithelialized Wounding Event: Shear/Friction Date Acquired: 04/15/2016 Weeks Of Treatment: 4 Clustered Wound: No Photos Photo Uploaded By: Alric Quan on 05/27/2016 08:21:30 Wound Measurements Length: (cm) 0 % Reduction Width: (cm) 0 % Reduction Depth: (cm) 0 Area: (cm) 0 Volume: (cm) 0 in Area: 100% in Volume: 100% Wound Description Classification: Grade 1 Periwound Skin Texture Texture Color No Abnormalities Noted: No No Abnormalities Noted: No Moisture No Abnormalities Noted: No Electronic Signature(s) Signed: 05/27/2016 3:59:42 PM By: Neldon Newport ([id number]) Entered By: Alric Quan on 05/27/2016 08:13:30 Steve Charles ([id number]) -------------------------------------------------------------------------------- Vitals Details Patient Name: Steve Charles Date of Service: 05/27/2016 8:00 AM Medical Record Number: [id number] Patient Account Number: [id number] Date of Birth/Sex: 10-14-1946 (70 y.o. Male) Treating RN: Carolyne Fiscal, Debi Primary Care Physician: Carolann Littler Other Clinician: Referring Physician: Carolann Littler Treating Physician/Extender: Ricard Dillon Weeks in Treatment: 4 Vital Signs Time Taken: 07:59 Temperature (F): 97.8 Height (in): 68 Pulse (bpm): 81 Weight (lbs): 257 Respiratory Rate (breaths/min): 18 Body Mass Index (BMI): 39.1 Blood Pressure (mmHg): 133/71 Reference Range: 80 - 120 mg / dl Electronic Signature(s) Signed: 05/27/2016 3:59:42 PM By: Alric Quan Entered By: Alric Quan on 05/27/2016 08:01:27

## 2016-05-28 NOTE — Progress Notes (Signed)
GLOVER, DIBERT ([id number]) Visit Report for 05/27/2016 Chief Complaint Document Details Patient Name: MERION, PETTUS Date of Service: 05/27/2016 8:00 AM Medical Record Patient Account Number: [id number] Number: Treating RN: Ahmed Prima 1945-11-15 (70 y.o. Other Clinician: Date of Birth/Sex: Male) Treating Khristina Janota Primary Care Physician: Carolann Littler Physician/Extender: G Referring Physician: Sherlynn Carbon in Treatment: 4 Information Obtained from: Patient Chief Complaint Patient is here for review of a traumatic wound in the setting of type 2 diabetes to his left posterior calf Electronic Signature(s) Signed: 05/28/2016 7:54:01 AM By: Linton Ham MD Entered By: Linton Ham on 05/27/2016 08:30:02 [name] ([id number]) -------------------------------------------------------------------------------- HPI Details Patient Name: [name] Date of Service: 05/27/2016 8:00 AM Medical Record Patient Account Number: [id number] Number: Treating RN: Ahmed Prima 14-Mar-1946 (70 y.o. Other Clinician: Date of Birth/Sex: Male) Treating Laquonda Welby Primary Care Physician: Carolann Littler Physician/Extender: G Referring Physician: Sherlynn Carbon in Treatment: 4 History of Present Illness HPI Description: 04/29/16; this is a 70 year old man who is a type II diabetic on insulin. He was coming back into his home from a mailbox and fell traumatizing the posterior aspect of his left calf on concrete. He went to see his primary physician and received an injection of antibiotics, Td vaccination, Silvadene cream. I believe he also went to an urgent care and gone on an oral antibiotic but I am not sure which one however he says he is on it for 10 days. He has been using a combination of Neosporin. He does not have a history of PAD or diabetic neuropathy although he states he has spinal cord stenosis resulting in some  inguinal radicular symptoms. He apparently was seen in the wound care clinic in [place] predominantly related to what sounds like venous insufficiency wounds on the other leg some years ago. ABIs in this clinic were 1.03 bilaterally waveforms were multiphasic 05/07/16 the patient's left leg/posterior calf wound looks considerably better healthy wound which appears to have progressed towards healing in the last week. The hematoma that was concerning last week is also resolved. Patient states the pain got a lot better over the course of last weekend. 05/13/16:left lower leg wound has significantly improved from previous visit. he brought his compression garment today for instruction and application. he denies pain or discomfort associated with his wound no reports of systemic s/s of infection. 05/20/16; left posterior leg wound which was initially trauma. Venous stasis and type 2 diabetes are concomitant issues. He does not have PAD 05/27/16 left posterior leg wound which was initially traumatic in the setting of venous stasis and type 2 diabetes. This is totally closed. He does not have PAD. Previously seen some years ago in the clinic in [place] predominantly due to venous insufficiency I think he was given stockings although he could not easily get them on Electronic Signature(s) Signed: 05/28/2016 7:54:01 AM By: Linton Ham MD Entered By: Linton Ham on 05/27/2016 08:31:08 [name] ([id number]) -------------------------------------------------------------------------------- Physical Exam Details Patient Name: [name] Date of Service: 05/27/2016 8:00 AM Medical Record Patient Account Number: [id number] Number: Treating RN: Ahmed Prima 04-28-1946 (70 y.o. Other Clinician: Date of Birth/Sex: Male) Treating Breauna Mazzeo Primary Care Physician: Carolann Littler Physician/Extender: G Referring Physician: Sherlynn Carbon in Treatment:  4 Constitutional Sitting or standing Blood Pressure is within target range for patient.. Pulse regular and within target range for patient.Marland Kitchen Respirations regular, non-labored and within target range.. Temperature is normal and within the target range for  the patient.. Eyes Conjunctivae clear. No discharge.Marland Kitchen Respiratory Respiratory effort is easy and symmetric bilaterally. Rate is normal at rest and on room air.. Cardiovascular Pedal pulses palpable and strong bilaterally.. Lymphatic None palpable in the popliteal or inguinal area. Integumentary (Hair, Skin) Patient clearly has some degree of venous insufficiency/reflux. He does not have major edema. Psychiatric No evidence of depression, anxiety, or agitation. Calm, cooperative, and communicative. Appropriate interactions and affect.. Notes Exam; the area is totally healed and epithelialized. Electronic Signature(s) Signed: 05/28/2016 7:54:01 AM By: Linton Ham MD Entered By: Linton Ham on 05/27/2016 08:32:27 [name] ([id number]) -------------------------------------------------------------------------------- Physician Orders Details Patient Name: [name] Date of Service: 05/27/2016 8:00 AM Medical Record Patient Account Number: [id number] Number: Treating RN: Ahmed Prima 08-21-1946 (70 y.o. Other Clinician: Date of Birth/Sex: Male) Treating Mumtaz Lovins Primary Care Physician: Carolann Littler Physician/Extender: G Referring Physician: Sherlynn Carbon in Treatment: 4 Verbal / Phone Orders: Yes Clinician: Carolyne Fiscal, Debi Read Back and Verified: Yes Diagnosis Coding Discharge From [facility] Wound #1 Left,Posterior Lower Leg o Discharge from [place] area clean and dry. Please call the clinic if you have any questions or need anything. Electronic Signature(s) Signed: 05/27/2016 3:59:42 PM By: Alric Quan Signed: 05/28/2016 7:54:01 AM By: Linton Ham MD Entered By: Alric Quan on 05/27/2016 08:12:04 [name] ([id number]) -------------------------------------------------------------------------------- Problem List Details Patient Name: [name] Date of Service: 05/27/2016 8:00 AM Medical Record Patient Account Number: [id number] Number: Treating RN: Ahmed Prima 12/12/1945 (70 y.o. Other Clinician: Date of Birth/Sex: Male) Treating Maysel Mccolm Primary Care Physician: Carolann Littler Physician/Extender: G Referring Physician: Sherlynn Carbon in Treatment: 4 Active Problems ICD-10 Encounter Code Description Active Date Diagnosis E11.622 Type 2 diabetes mellitus with other skin ulcer 04/29/2016 Yes I87.322 Chronic venous hypertension (idiopathic) with 04/29/2016 Yes inflammation of left lower extremity L97.221 Non-pressure chronic ulcer of left calf limited to 04/29/2016 Yes breakdown of skin Inactive Problems Resolved Problems Electronic Signature(s) Signed: 05/28/2016 7:54:01 AM By: Linton Ham MD Entered By: Linton Ham on 05/27/2016 08:29:42 [name] ([id number]) -------------------------------------------------------------------------------- Progress Note Details Patient Name: [name] Date of Service: 05/27/2016 8:00 AM Medical Record Patient Account Number: [id number] Number: Treating RN: Ahmed Prima 09-Aug-1946 (70 y.o. Other Clinician: Date of Birth/Sex: Male) Treating Krystofer Hevener Primary Care Physician: Carolann Littler Physician/Extender: G Referring Physician: Sherlynn Carbon in Treatment: 4 Subjective Chief Complaint Information obtained from Patient Patient is here for review of a traumatic wound in the setting of type 2 diabetes to his left posterior calf History of Present Illness (HPI) 04/29/16; this is a 70 year old man who is a type II diabetic on insulin. He was coming back into his home from a  mailbox and fell traumatizing the posterior aspect of his left calf on concrete. He went to see his primary physician and received an injection of antibiotics, Td vaccination, Silvadene cream. I believe he also went to an urgent care and gone on an oral antibiotic but I am not sure which one however he says he is on it for 10 days. He has been using a combination of Neosporin. He does not have a history of PAD or diabetic neuropathy although he states he has spinal cord stenosis resulting in some inguinal radicular symptoms. He apparently was seen in the wound care clinic in [place] predominantly related to what sounds like venous insufficiency wounds on the other leg some years ago. ABIs in this clinic were 1.03 bilaterally  waveforms were multiphasic 05/07/16 the patient's left leg/posterior calf wound looks considerably better healthy wound which appears to have progressed towards healing in the last week. The hematoma that was concerning last week is also resolved. Patient states the pain got a lot better over the course of last weekend. 05/13/16:left lower leg wound has significantly improved from previous visit. he brought his compression garment today for instruction and application. he denies pain or discomfort associated with his wound no reports of systemic s/s of infection. 05/20/16; left posterior leg wound which was initially trauma. Venous stasis and type 2 diabetes are concomitant issues. He does not have PAD 05/27/16 left posterior leg wound which was initially traumatic in the setting of venous stasis and type 2 diabetes. This is totally closed. He does not have PAD. Previously seen some years ago in the clinic in [place] predominantly due to venous insufficiency I think he was given stockings although he could not easily get them on Objective ULMER, EREKSON. ([id number]) Constitutional Sitting or standing Blood Pressure is within target range for patient.. Pulse regular and  within target range for patient.Marland Kitchen Respirations regular, non-labored and within target range.. Temperature is normal and within the target range for the patient.. Vitals Time Taken: 7:59 AM, Height: 68 in, Weight: 257 lbs, BMI: 39.1, Temperature: 97.8 F, Pulse: 81 bpm, Respiratory Rate: 18 breaths/min, Blood Pressure: 133/71 mmHg. Eyes Conjunctivae clear. No discharge.Marland Kitchen Respiratory Respiratory effort is easy and symmetric bilaterally. Rate is normal at rest and on room air.. Cardiovascular Pedal pulses palpable and strong bilaterally.. Lymphatic None palpable in the popliteal or inguinal area. Psychiatric No evidence of depression, anxiety, or agitation. Calm, cooperative, and communicative. Appropriate interactions and affect.. General Notes: Exam; the area is totally healed and epithelialized. Integumentary (Hair, Skin) Patient clearly has some degree of venous insufficiency/reflux. He does not have major edema. Wound #1 status is Healed - Epithelialized. Original cause of wound was Shear/Friction. The wound is located on the Left,Posterior Lower Leg. The wound measures 0cm length x 0cm width x 0cm depth; 0cm^2 area and 0cm^3 volume. Assessment Active Problems ICD-10 E11.622 - Type 2 diabetes mellitus with other skin ulcer I87.322 - Chronic venous hypertension (idiopathic) with inflammation of left lower extremity L97.221 - Non-pressure chronic ulcer of left calf limited to breakdown of skin URIAH, KLABUNDE ([id number]) Plan Discharge From [facility]: Wound #1 Left,Posterior Lower Leg: Discharge from [place] area clean and dry. Please call the clinic if you have any questions or need anything. 1) The patient is healed. Although this is not his first visit to wound clinic I doubt I could convince him to wear stockings 2) wound on the left leg is totally healed Electronic Signature(s) Signed: 05/28/2016 7:54:01 AM By: Linton Ham MD Entered By: Linton Ham on 05/27/2016 08:35:54 [name] ([id number]) -------------------------------------------------------------------------------- [place] Details Patient Name: [name] Date of Service: 05/27/2016 Medical Record Patient Account Number: [id number] Number: Treating RN: Ahmed Prima 1945/11/23 (70 y.o. Other Clinician: Date of Birth/Sex: Male) Treating Adrielle Polakowski Primary Care Physician: Carolann Littler Physician/Extender: G Referring Physician: Sherlynn Carbon in Treatment: 4 Diagnosis Coding ICD-10 Codes Code Description E11.622 Type 2 diabetes mellitus with other skin ulcer I87.322 Chronic venous hypertension (idiopathic) with inflammation of left lower extremity L97.221 Non-pressure chronic ulcer of left calf limited to breakdown of skin Facility Procedures CPT4 Code: [id number] Description: [id number] - WOUND CARE VISIT-LEV 2 EST PT Modifier: Quantity: 1 Physician Procedures CPT4: Description Modifier Quantity Code  [id number] - WC PHYS LEVEL 3 - EST PT 1 ICD-10 Description Diagnosis I87.322 Chronic venous hypertension (idiopathic) with inflammation of left lower extremity Electronic Signature(s) Signed: 05/27/2016 3:59:42 PM By: Alric Quan Signed: 05/28/2016 7:54:01 AM By: Linton Ham MD Entered By: Alric Quan on 05/27/2016 11:35:59

## 2016-05-28 NOTE — Telephone Encounter (Signed)
Close Encounter 

## 2016-05-29 ENCOUNTER — Encounter (HOSPITAL_COMMUNITY)
Admission: RE | Admit: 2016-05-29 | Discharge: 2016-05-29 | Disposition: A | Discharge status: Home / Self Care | Payer: Medicare Other | Source: Ambulatory Visit | Attending: Neurosurgery | Admitting: Neurosurgery

## 2016-05-29 ENCOUNTER — Encounter (HOSPITAL_COMMUNITY): Payer: Self-pay

## 2016-05-29 ENCOUNTER — Telehealth: Payer: Self-pay | Admitting: Pulmonary Disease

## 2016-05-29 DIAGNOSIS — I2510 Atherosclerotic heart disease of native coronary artery without angina pectoris: Secondary | ICD-10-CM | POA: Insufficient documentation

## 2016-05-29 DIAGNOSIS — E785 Hyperlipidemia, unspecified: Secondary | ICD-10-CM | POA: Insufficient documentation

## 2016-05-29 DIAGNOSIS — Z87891 Personal history of nicotine dependence: Secondary | ICD-10-CM | POA: Insufficient documentation

## 2016-05-29 DIAGNOSIS — Z01818 Encounter for other preprocedural examination: Secondary | ICD-10-CM | POA: Diagnosis not present

## 2016-05-29 DIAGNOSIS — Z01812 Encounter for preprocedural laboratory examination: Secondary | ICD-10-CM | POA: Insufficient documentation

## 2016-05-29 DIAGNOSIS — I10 Essential (primary) hypertension: Secondary | ICD-10-CM | POA: Diagnosis not present

## 2016-05-29 DIAGNOSIS — E119 Type 2 diabetes mellitus without complications: Secondary | ICD-10-CM | POA: Insufficient documentation

## 2016-05-29 DIAGNOSIS — Z7982 Long term (current) use of aspirin: Secondary | ICD-10-CM | POA: Diagnosis not present

## 2016-05-29 DIAGNOSIS — J449 Chronic obstructive pulmonary disease, unspecified: Secondary | ICD-10-CM | POA: Diagnosis not present

## 2016-05-29 DIAGNOSIS — Z79899 Other long term (current) drug therapy: Secondary | ICD-10-CM | POA: Insufficient documentation

## 2016-05-29 DIAGNOSIS — G4733 Obstructive sleep apnea (adult) (pediatric): Secondary | ICD-10-CM | POA: Insufficient documentation

## 2016-05-29 DIAGNOSIS — Z794 Long term (current) use of insulin: Secondary | ICD-10-CM | POA: Insufficient documentation

## 2016-05-29 HISTORY — DX: Cardiac arrhythmia, unspecified: I49.9

## 2016-05-29 HISTORY — DX: Contact with and (suspected) exposure to tuberculosis: Z20.1

## 2016-05-29 LAB — GLUCOSE, CAPILLARY: Glucose-Capillary: 184 mg/dL — ABNORMAL HIGH (ref 65–99)

## 2016-05-29 LAB — BASIC METABOLIC PANEL
ANION GAP: 6 (ref 5–15)
BUN: 12 mg/dL (ref 6–20)
CALCIUM: 9.3 mg/dL (ref 8.9–10.3)
CHLORIDE: 100 mmol/L — AB (ref 101–111)
CO2: 30 mmol/L (ref 22–32)
Creatinine, Ser: 0.74 mg/dL (ref 0.61–1.24)
GFR calc non Af Amer: >60 mL/min (ref >60)
GLUCOSE: 181 mg/dL — AB (ref 65–99)
POTASSIUM: 4.4 mmol/L (ref 3.5–5.1)
Sodium: 136 mmol/L (ref 135–145)

## 2016-05-29 LAB — CBC
HEMATOCRIT: 52.2 % — AB (ref 39.0–52.0)
HEMOGLOBIN: 16.8 g/dL (ref 13.0–17.0)
MCH: 29.6 pg (ref 26.0–34.0)
MCHC: 32.2 g/dL (ref 30.0–36.0)
MCV: 92.1 fL (ref 78.0–100.0)
Platelets: 235 10*3/uL (ref 150–400)
RBC: 5.67 MIL/uL (ref 4.22–5.81)
RDW: 14.1 % (ref 11.5–15.5)
WBC: 9.1 10*3/uL (ref 4.0–10.5)

## 2016-05-29 LAB — SURGICAL PCR SCREEN
MRSA, PCR: POSITIVE — AB
Staphylococcus aureus: POSITIVE — AB

## 2016-05-29 NOTE — Telephone Encounter (Signed)
Ok for surgery Brian Crenshaw  

## 2016-05-29 NOTE — Telephone Encounter (Signed)
Form is in VS's look at along with a CPAP download. LOV was w/AD but he will not be in office until 06/05/16.  [facility] @ [facility] but ofc closed @ 5pm, [employer]

## 2016-05-29 NOTE — Telephone Encounter (Signed)
Will forward for dr crenshaw review  

## 2016-05-29 NOTE — Pre-Procedure Instructions (Signed)
Steve Charles  05/29/2016      [facility] - [place], [place] - [address] [place] [address] Phone: [phone] Fax: [phone]    Your procedure is scheduled on  Wednesday August 2nd  Report to [facility] Admitting at 10 a.m.  Call this number if you have problems the morning of surgery:  [phone]   Remember:  Do not eat food or drink liquids after midnight.  Take these medicines the morning of surgery with A SIP OF WATER: gabapentin (neurontin)  Stop taking all aspirin and aspirin containing products, Nsaids (i.e. Ibuprofen, motrin, aleve, advil, naproxen), Vitamins and herbal medications   WHAT DO I DO ABOUT MY DIABETES MEDICATION?   Marland Kitchen Do not take oral diabetes medicines (pills) the morning of surgery.  . THE NIGHT BEFORE SURGERY, take 5 units of Lantus insulin.     How to Manage Your Diabetes Before and After Surgery  Why is it important to control my blood sugar before and after surgery? . Improving blood sugar levels before and after surgery helps healing and can limit problems. . A way of improving blood sugar control is eating a healthy diet by: o  Eating less sugar and carbohydrates o  Increasing activity/exercise o  Talking with your doctor about reaching your blood sugar goals . High blood sugars (greater than 180 mg/dL) can raise your risk of infections and slow your recovery, so you will need to focus on controlling your diabetes during the weeks before surgery. . Make sure that the doctor who takes care of your diabetes knows about your planned surgery including the date and location.  How do I manage my blood sugar before surgery? . Check your blood sugar at least 4 times a day, starting 2 days before surgery, to make sure that the level is not too high or low. o Check your blood sugar the morning of your surgery when you wake up and every 2 hours until you get to the Short Stay unit. . If your blood sugar  is less than 70 mg/dL, you will need to treat for low blood sugar: o Do not take insulin. o Treat a low blood sugar (less than 70 mg/dL) with  cup of clear juice (cranberry or apple), 4 glucose tablets, OR glucose gel. o Recheck blood sugar in 15 minutes after treatment (to make sure it is greater than 70 mg/dL). If your blood sugar is not greater than 70 mg/dL on recheck, call [phone] for further instructions. . Report your blood sugar to the short stay nurse when you get to Short Stay.  . If you are admitted to the hospital after surgery: o Your blood sugar will be checked by the staff and you will probably be given insulin after surgery (instead of oral diabetes medicines) to make sure you have good blood sugar levels. o The goal for blood sugar control after surgery is 80-180 mg/dL.        Do not wear jewelry  Do not wear lotions, powders, or cologne.  You may not  wear deoderant.  Men may shave face and neck.   Do not bring valuables to the hospital.  [facility] is not responsible for any belongings or valuables.  Contacts, dentures or bridgework may not be worn into surgery.  Leave your suitcase in the car.  After surgery it may be brought to your room.  For patients admitted to the hospital, discharge time will  be determined by your treatment team.  Special instructions: Shower with CHG the night before and morning of surgery as instructed  Please read over the following fact sheets that you were given. MRSA Information, shower instructions

## 2016-05-29 NOTE — Telephone Encounter (Signed)
New Message  Request for surgical clearance:  1. What type of surgery is being performed? ACDF   2. When is this surgery scheduled? 8.2.17   3. Are there any medications that need to be held prior to surgery and how long?  MD advisement  4. Name of physician performing surgery? Dr. Sherwood Gambler   5. What is your office phone and fax number? [phone]-Phone [phone]

## 2016-05-29 NOTE — Progress Notes (Signed)
Pt with SVT. Cardiologist Dr Stanford Breed. Last seen 2016  Stress test 06/01/14  Echo 11/08/13  EKG 08/14/15  PCP Dr Darnell Level Burchette  A1C done 05/12/16    7.0

## 2016-05-29 NOTE — Telephone Encounter (Signed)
Will forward this note to the number provided. 

## 2016-05-30 NOTE — Telephone Encounter (Signed)
[phone] you can reach her @ this #.Steve Charles

## 2016-05-30 NOTE — Progress Notes (Signed)
I called a prescription for Mupirocin ointment to [facility] Charles, [place], [place].

## 2016-05-30 NOTE — Telephone Encounter (Signed)
vanessa returning call I let her know that VS is out of office, she says pt surgery is Tuesday.Hillery Hunter

## 2016-05-30 NOTE — Telephone Encounter (Signed)
Spoke with Steve Charles and notified of that VS is back in the office on 06/02/16 and we will do our best to have form signed then. She states that the surgery is set for Tues 8/1. Will forward to VS and AG marked urgent. Thanks.

## 2016-06-02 NOTE — Telephone Encounter (Signed)
Sx clearance faxed back to [facility]. Lorriane Shire at [facility] is aware.  Nothing further needed.

## 2016-06-02 NOTE — Telephone Encounter (Signed)
I have signed form.

## 2016-06-02 NOTE — Progress Notes (Signed)
Anesthesia Chart Review:  Pt is a 70 year old male scheduled for C3-4, C4-5, ACDF on 06/04/2016 with Jovita Gamma, MD.   Cardiologist is Kirk Ruths, MD who has cleared pt for surgery. PCP is Carolann Littler, MD. EP cardiologist is Cristopher Peru, MD.   PMH includes:  CAD (LAD 50% by 2012 cath), SVT, HTN, hyperlipidemia, DM, OSA, COPD. Former smoker (quit 04/29/16). BMI 40  Medications include: ASA, canaglifozin, lasix, glimepiride, lantus, metformin, pravastatin, verapamil.   Preoperative labs reviewed.  Glucose 181. HgbA1c was 7.0 on 05/12/16.   Chest x-ray 02/11/16 reviewed. Low lung volumes with mild bibasilar atelectasis and/or infiltrates.  08/14/15: sinus rhythm, possible PACs. Possible anterior infarct, age undetermined.   Holter monitor 10/10/15:  1. NSR with frequent PVC's and PAC's. 2. Ventricular couplets are present 3. No significant brady or tachy arrhythmias.  Nuclear stress test 06/01/14: Normal stress nuclear study. LV Ejection Fraction: 64%.  LV Wall Motion:  NL LV Function; NL Wall Motion  Echo 11/08/13:  - Left ventricle: The cavity size was normal. Wall thickness was normal. Systolic function was normal. The estimated ejection fraction was in the range of 55% to 60%. Wall motion was normal; there were no regional wall motion abnormalities. Doppler parameters are consistent with abnormal left ventricular relaxation (grade 1 diastolic dysfunction). Doppler parameters are consistent with high ventricular filling pressure. - Atrial septum: No defect or patent foramen ovale was identified. - Impressions: Impaired relaxation with mildly elevated filling pressures, otherwise normal study. - Impressions: Impaired relaxation with mildly elevated fillingpressures, otherwise normal study.  Carotid duplex 03/22/12: Normal carotid arteries bilaterally  Right and left cardiac cath 02/07/11: Nonobstructive coronary artery disease.  Normal pulmonary artery pressures.  If no changes, I  anticipate pt can proceed with surgery as scheduled.   Willeen Cass, FNP-BC [facility]/Anesthesiology Phone: [phone] 06/02/2016 12:34 PM

## 2016-06-03 MED ORDER — DEXTROSE 5 % IV SOLN
3.0000 g | INTRAVENOUS | Status: AC
  Administered 2016-06-04: 3 g via INTRAVENOUS
  Filled 2016-06-03: qty 3000

## 2016-06-04 ENCOUNTER — Observation Stay (HOSPITAL_COMMUNITY)
Admission: RE | Admit: 2016-06-04 | Discharge: 2016-06-05 | Disposition: A | Discharge status: Home / Self Care | Payer: Medicare Other | Source: Ambulatory Visit | Attending: Neurosurgery | Admitting: Neurosurgery

## 2016-06-04 ENCOUNTER — Inpatient Hospital Stay (HOSPITAL_COMMUNITY): Payer: Medicare Other | Admitting: Anesthesiology

## 2016-06-04 ENCOUNTER — Inpatient Hospital Stay (HOSPITAL_COMMUNITY): Payer: Medicare Other | Admitting: Emergency Medicine

## 2016-06-04 ENCOUNTER — Observation Stay (HOSPITAL_COMMUNITY): Payer: Medicare Other

## 2016-06-04 ENCOUNTER — Encounter (HOSPITAL_COMMUNITY): Payer: Self-pay | Admitting: Certified Registered Nurse Anesthetist

## 2016-06-04 ENCOUNTER — Encounter (HOSPITAL_COMMUNITY)
Admission: RE | Disposition: A | Discharge status: Home / Self Care | Payer: Self-pay | Source: Ambulatory Visit | Attending: Neurosurgery

## 2016-06-04 DIAGNOSIS — M5020 Other cervical disc displacement, unspecified cervical region: Secondary | ICD-10-CM | POA: Diagnosis present

## 2016-06-04 DIAGNOSIS — M4322 Fusion of spine, cervical region: Secondary | ICD-10-CM | POA: Diagnosis not present

## 2016-06-04 DIAGNOSIS — Z79899 Other long term (current) drug therapy: Secondary | ICD-10-CM | POA: Diagnosis not present

## 2016-06-04 DIAGNOSIS — I2510 Atherosclerotic heart disease of native coronary artery without angina pectoris: Secondary | ICD-10-CM | POA: Insufficient documentation

## 2016-06-04 DIAGNOSIS — Z87891 Personal history of nicotine dependence: Secondary | ICD-10-CM | POA: Diagnosis not present

## 2016-06-04 DIAGNOSIS — J449 Chronic obstructive pulmonary disease, unspecified: Secondary | ICD-10-CM | POA: Diagnosis not present

## 2016-06-04 DIAGNOSIS — M5021 Other cervical disc displacement,  high cervical region: Secondary | ICD-10-CM | POA: Diagnosis not present

## 2016-06-04 DIAGNOSIS — Z6839 Body mass index (BMI) 39.0-39.9, adult: Secondary | ICD-10-CM | POA: Diagnosis not present

## 2016-06-04 DIAGNOSIS — G4733 Obstructive sleep apnea (adult) (pediatric): Secondary | ICD-10-CM | POA: Diagnosis not present

## 2016-06-04 DIAGNOSIS — E785 Hyperlipidemia, unspecified: Secondary | ICD-10-CM | POA: Insufficient documentation

## 2016-06-04 DIAGNOSIS — M50121 Cervical disc disorder at C4-C5 level with radiculopathy: Principal | ICD-10-CM | POA: Insufficient documentation

## 2016-06-04 DIAGNOSIS — Z419 Encounter for procedure for purposes other than remedying health state, unspecified: Secondary | ICD-10-CM

## 2016-06-04 DIAGNOSIS — Z8673 Personal history of transient ischemic attack (TIA), and cerebral infarction without residual deficits: Secondary | ICD-10-CM | POA: Insufficient documentation

## 2016-06-04 DIAGNOSIS — M542 Cervicalgia: Secondary | ICD-10-CM | POA: Diagnosis not present

## 2016-06-04 DIAGNOSIS — Z7982 Long term (current) use of aspirin: Secondary | ICD-10-CM | POA: Diagnosis not present

## 2016-06-04 DIAGNOSIS — E119 Type 2 diabetes mellitus without complications: Secondary | ICD-10-CM | POA: Insufficient documentation

## 2016-06-04 DIAGNOSIS — Z7984 Long term (current) use of oral hypoglycemic drugs: Secondary | ICD-10-CM | POA: Insufficient documentation

## 2016-06-04 DIAGNOSIS — M50221 Other cervical disc displacement at C4-C5 level: Secondary | ICD-10-CM | POA: Diagnosis not present

## 2016-06-04 DIAGNOSIS — M5412 Radiculopathy, cervical region: Secondary | ICD-10-CM | POA: Diagnosis not present

## 2016-06-04 DIAGNOSIS — M545 Low back pain: Secondary | ICD-10-CM | POA: Diagnosis not present

## 2016-06-04 HISTORY — DX: Obstructive sleep apnea (adult) (pediatric): G47.33

## 2016-06-04 HISTORY — DX: Pneumonia, unspecified organism: J18.9

## 2016-06-04 HISTORY — DX: Low back pain, unspecified: M54.50

## 2016-06-04 HISTORY — DX: Low back pain: M54.5

## 2016-06-04 HISTORY — DX: Type 2 diabetes mellitus without complications: E11.9

## 2016-06-04 HISTORY — DX: Unspecified osteoarthritis, unspecified site: M19.90

## 2016-06-04 HISTORY — PX: ANTERIOR CERVICAL DECOMP/DISCECTOMY FUSION: SHX1161

## 2016-06-04 HISTORY — DX: Dependence on other enabling machines and devices: Z99.89

## 2016-06-04 HISTORY — DX: Other chronic pain: G89.29

## 2016-06-04 LAB — GLUCOSE, CAPILLARY
Glucose-Capillary: 151 mg/dL — ABNORMAL HIGH (ref 65–99)
Glucose-Capillary: 156 mg/dL — ABNORMAL HIGH (ref 65–99)
Glucose-Capillary: 330 mg/dL — ABNORMAL HIGH (ref 65–99)

## 2016-06-04 SURGERY — ANTERIOR CERVICAL DECOMPRESSION/DISCECTOMY FUSION 2 LEVELS
Anesthesia: General | Site: Spine Cervical

## 2016-06-04 MED ORDER — CHLORHEXIDINE GLUCONATE CLOTH 2 % EX PADS: 6.0000 | MEDICATED_PAD | Freq: Once | CUTANEOUS | Status: DC

## 2016-06-04 MED ORDER — GABAPENTIN 300 MG PO CAPS
300.0000 mg | ORAL_CAPSULE | Freq: Three times a day (TID) | ORAL | Status: DC
  Administered 2016-06-04 – 2016-06-05 (×3): 300 mg via ORAL
  Filled 2016-06-04 (×3): qty 1

## 2016-06-04 MED ORDER — PHENOL 1.4 % MT LIQD
1.0000 | OROMUCOSAL | Status: DC | PRN
Start: 2016-06-04 — End: 2016-06-05
  Filled 2016-06-04: qty 177

## 2016-06-04 MED ORDER — BISACODYL 10 MG RE SUPP: 10.0000 mg | Freq: Every day | RECTAL | Status: DC | PRN

## 2016-06-04 MED ORDER — ALUM & MAG HYDROXIDE-SIMETH 200-200-20 MG/5ML PO SUSP: 30.0000 mL | Freq: Four times a day (QID) | ORAL | Status: DC | PRN

## 2016-06-04 MED ORDER — ACETAMINOPHEN 10 MG/ML IV SOLN
INTRAVENOUS | Status: DC | PRN
  Administered 2016-06-04: 1000 mg via INTRAVENOUS

## 2016-06-04 MED ORDER — SUGAMMADEX SODIUM 200 MG/2ML IV SOLN
INTRAVENOUS | Status: DC | PRN
  Administered 2016-06-04: 200 mg via INTRAVENOUS

## 2016-06-04 MED ORDER — ONDANSETRON HCL 4 MG/2ML IJ SOLN
INTRAMUSCULAR | Status: AC
  Filled 2016-06-04: qty 2

## 2016-06-04 MED ORDER — HYDROXYZINE HCL 25 MG PO TABS: 50.0000 mg | ORAL_TABLET | ORAL | Status: DC | PRN

## 2016-06-04 MED ORDER — FENTANYL CITRATE (PF) 100 MCG/2ML IJ SOLN
INTRAMUSCULAR | Status: AC
  Filled 2016-06-04: qty 2

## 2016-06-04 MED ORDER — KETOROLAC TROMETHAMINE 15 MG/ML IJ SOLN
INTRAMUSCULAR | Status: AC
  Administered 2016-06-04: 15 mg
  Filled 2016-06-04: qty 1

## 2016-06-04 MED ORDER — PHENYLEPHRINE HCL 10 MG/ML IJ SOLN
INTRAMUSCULAR | Status: DC | PRN
  Administered 2016-06-04 (×2): 120 ug via INTRAVENOUS

## 2016-06-04 MED ORDER — PRAVASTATIN SODIUM 40 MG PO TABS
40.0000 mg | ORAL_TABLET | Freq: Every evening | ORAL | Status: DC
  Administered 2016-06-04: 40 mg via ORAL
  Filled 2016-06-04: qty 1

## 2016-06-04 MED ORDER — DEXAMETHASONE SODIUM PHOSPHATE 10 MG/ML IJ SOLN
INTRAMUSCULAR | Status: AC
  Filled 2016-06-04: qty 1

## 2016-06-04 MED ORDER — INSULIN ASPART 100 UNIT/ML ~~LOC~~ SOLN
0.0000 [IU] | Freq: Three times a day (TID) | SUBCUTANEOUS | Status: DC
  Administered 2016-06-05: 8 [IU] via SUBCUTANEOUS

## 2016-06-04 MED ORDER — LACTATED RINGERS IV SOLN
INTRAVENOUS | Status: DC
  Administered 2016-06-04 (×2): via INTRAVENOUS

## 2016-06-04 MED ORDER — PHENYLEPHRINE HCL 10 MG/ML IJ SOLN
INTRAMUSCULAR | Status: DC | PRN
  Administered 2016-06-04: 25 ug/min via INTRAVENOUS

## 2016-06-04 MED ORDER — GLYCOPYRROLATE 0.2 MG/ML IV SOSY
PREFILLED_SYRINGE | INTRAVENOUS | Status: AC
  Filled 2016-06-04: qty 3

## 2016-06-04 MED ORDER — KETOROLAC TROMETHAMINE 30 MG/ML IJ SOLN: 15.0000 mg | Freq: Once | INTRAMUSCULAR | Status: DC

## 2016-06-04 MED ORDER — ACETAMINOPHEN 325 MG PO TABS: 650.0000 mg | ORAL_TABLET | ORAL | Status: DC | PRN

## 2016-06-04 MED ORDER — ALBUMIN HUMAN 5 % IV SOLN
INTRAVENOUS | Status: DC | PRN
  Administered 2016-06-04: 14:00:00 via INTRAVENOUS

## 2016-06-04 MED ORDER — GLIMEPIRIDE 1 MG PO TABS
1.0000 mg | ORAL_TABLET | Freq: Two times a day (BID) | ORAL | Status: DC
  Administered 2016-06-04 – 2016-06-05 (×2): 1 mg via ORAL
  Filled 2016-06-04 (×3): qty 1

## 2016-06-04 MED ORDER — LIDOCAINE HCL (CARDIAC) 20 MG/ML IV SOLN
INTRAVENOUS | Status: DC | PRN
  Administered 2016-06-04: 100 mg via INTRAVENOUS

## 2016-06-04 MED ORDER — ROCURONIUM BROMIDE 100 MG/10ML IV SOLN
INTRAVENOUS | Status: DC | PRN
  Administered 2016-06-04: 50 mg via INTRAVENOUS

## 2016-06-04 MED ORDER — HYDROCODONE-ACETAMINOPHEN 5-325 MG PO TABS: 1.0000 | ORAL_TABLET | ORAL | Status: DC | PRN

## 2016-06-04 MED ORDER — SODIUM CHLORIDE 0.9% FLUSH: 3.0000 mL | INTRAVENOUS | Status: DC | PRN

## 2016-06-04 MED ORDER — ACETAMINOPHEN 650 MG RE SUPP: 650.0000 mg | RECTAL | Status: DC | PRN

## 2016-06-04 MED ORDER — INSULIN GLARGINE 100 UNIT/ML SOLOSTAR PEN: 10.0000 [IU] | PEN_INJECTOR | Freq: Every day | SUBCUTANEOUS | Status: DC

## 2016-06-04 MED ORDER — HYDROXYZINE HCL 50 MG/ML IM SOLN: 50.0000 mg | INTRAMUSCULAR | Status: DC | PRN

## 2016-06-04 MED ORDER — FENTANYL CITRATE (PF) 100 MCG/2ML IJ SOLN
25.0000 ug | INTRAMUSCULAR | Status: DC | PRN
  Administered 2016-06-04: 50 ug via INTRAVENOUS

## 2016-06-04 MED ORDER — ONDANSETRON HCL 4 MG PO TABS
4.0000 mg | ORAL_TABLET | Freq: Four times a day (QID) | ORAL | Status: DC | PRN
Start: 2016-06-04 — End: 2016-06-05

## 2016-06-04 MED ORDER — CYCLOBENZAPRINE HCL 10 MG PO TABS: 10.0000 mg | ORAL_TABLET | Freq: Three times a day (TID) | ORAL | Status: DC | PRN

## 2016-06-04 MED ORDER — NEOSTIGMINE METHYLSULFATE 5 MG/5ML IV SOSY
PREFILLED_SYRINGE | INTRAVENOUS | Status: AC
  Filled 2016-06-04: qty 5

## 2016-06-04 MED ORDER — KETOROLAC TROMETHAMINE 30 MG/ML IJ SOLN
15.0000 mg | Freq: Four times a day (QID) | INTRAMUSCULAR | Status: DC
  Administered 2016-06-04 – 2016-06-05 (×3): 15 mg via INTRAVENOUS
  Filled 2016-06-04 (×3): qty 1

## 2016-06-04 MED ORDER — ROCURONIUM BROMIDE 50 MG/5ML IV SOLN
INTRAVENOUS | Status: AC
  Filled 2016-06-04: qty 1

## 2016-06-04 MED ORDER — MORPHINE SULFATE (PF) 4 MG/ML IV SOLN: 4.0000 mg | INTRAVENOUS | Status: DC | PRN

## 2016-06-04 MED ORDER — FENTANYL CITRATE (PF) 250 MCG/5ML IJ SOLN
INTRAMUSCULAR | Status: AC
  Filled 2016-06-04: qty 5

## 2016-06-04 MED ORDER — SODIUM CHLORIDE 0.9 % IR SOLN
Status: DC | PRN
  Administered 2016-06-04: 13:00:00

## 2016-06-04 MED ORDER — ZOLPIDEM TARTRATE 5 MG PO TABS: 5.0000 mg | ORAL_TABLET | Freq: Every evening | ORAL | Status: DC | PRN

## 2016-06-04 MED ORDER — SODIUM CHLORIDE 0.9 % IV SOLN
500.0000 mg | Freq: Once | INTRAVENOUS | Status: AC
  Administered 2016-06-04: 1500 mg via INTRAVENOUS
  Filled 2016-06-04: qty 500

## 2016-06-04 MED ORDER — ACETAMINOPHEN 10 MG/ML IV SOLN
INTRAVENOUS | Status: AC
  Filled 2016-06-04: qty 100

## 2016-06-04 MED ORDER — ARTIFICIAL TEARS OP OINT
TOPICAL_OINTMENT | OPHTHALMIC | Status: AC
  Filled 2016-06-04: qty 3.5

## 2016-06-04 MED ORDER — SODIUM CHLORIDE 0.9% FLUSH
3.0000 mL | Freq: Two times a day (BID) | INTRAVENOUS | Status: DC
  Administered 2016-06-04 – 2016-06-05 (×2): 3 mL via INTRAVENOUS

## 2016-06-04 MED ORDER — METFORMIN HCL 500 MG PO TABS
1000.0000 mg | ORAL_TABLET | Freq: Two times a day (BID) | ORAL | Status: DC
  Administered 2016-06-04 – 2016-06-05 (×2): 1000 mg via ORAL
  Filled 2016-06-04 (×2): qty 2

## 2016-06-04 MED ORDER — LIDOCAINE-EPINEPHRINE 1 %-1:100000 IJ SOLN
INTRAMUSCULAR | Status: DC | PRN
  Administered 2016-06-04: 7.5 mL

## 2016-06-04 MED ORDER — CANAGLIFLOZIN 300 MG PO TABS
300.0000 mg | ORAL_TABLET | Freq: Every day | ORAL | Status: DC
  Administered 2016-06-05 (×2): 300 mg via ORAL
  Filled 2016-06-04 (×2): qty 1

## 2016-06-04 MED ORDER — FENTANYL CITRATE (PF) 100 MCG/2ML IJ SOLN
INTRAMUSCULAR | Status: DC | PRN
  Administered 2016-06-04 (×4): 50 ug via INTRAVENOUS
  Administered 2016-06-04: 150 ug via INTRAVENOUS
  Administered 2016-06-04: 50 ug via INTRAVENOUS

## 2016-06-04 MED ORDER — SODIUM CHLORIDE 0.9 % IV SOLN
INTRAVENOUS | Status: DC
  Administered 2016-06-04: 18:00:00 via INTRAVENOUS

## 2016-06-04 MED ORDER — MAGNESIUM HYDROXIDE 400 MG/5ML PO SUSP: 30.0000 mL | Freq: Every day | ORAL | Status: DC | PRN

## 2016-06-04 MED ORDER — VANCOMYCIN HCL IN DEXTROSE 1-5 GM/200ML-% IV SOLN
INTRAVENOUS | Status: AC
  Filled 2016-06-04: qty 200

## 2016-06-04 MED ORDER — BUPIVACAINE HCL (PF) 0.25 % IJ SOLN
INTRAMUSCULAR | Status: DC | PRN
  Administered 2016-06-04: 7.5 mL

## 2016-06-04 MED ORDER — MENTHOL 3 MG MT LOZG: 1.0000 | LOZENGE | OROMUCOSAL | Status: DC | PRN

## 2016-06-04 MED ORDER — DEXAMETHASONE SODIUM PHOSPHATE 10 MG/ML IJ SOLN
INTRAMUSCULAR | Status: DC | PRN
  Administered 2016-06-04: 10 mg via INTRAVENOUS

## 2016-06-04 MED ORDER — ONDANSETRON HCL 4 MG/2ML IJ SOLN: 4.0000 mg | Freq: Four times a day (QID) | INTRAMUSCULAR | Status: DC | PRN

## 2016-06-04 MED ORDER — VERAPAMIL HCL ER 240 MG PO TBCR
240.0000 mg | EXTENDED_RELEASE_TABLET | Freq: Every day | ORAL | Status: DC
  Administered 2016-06-05: 240 mg via ORAL
  Filled 2016-06-04 (×2): qty 1

## 2016-06-04 MED ORDER — ONDANSETRON HCL 4 MG/2ML IJ SOLN
INTRAMUSCULAR | Status: DC | PRN
  Administered 2016-06-04: 4 mg via INTRAVENOUS

## 2016-06-04 MED ORDER — LIDOCAINE 2% (20 MG/ML) 5 ML SYRINGE
INTRAMUSCULAR | Status: AC
  Filled 2016-06-04: qty 5

## 2016-06-04 MED ORDER — VARENICLINE TARTRATE 0.5 MG PO TABS
0.5000 mg | ORAL_TABLET | Freq: Two times a day (BID) | ORAL | Status: DC
  Administered 2016-06-04 – 2016-06-05 (×2): 0.5 mg via ORAL
  Filled 2016-06-04 (×2): qty 1

## 2016-06-04 MED ORDER — SODIUM CHLORIDE 0.9 % IV SOLN: 250.0000 mL | INTRAVENOUS | Status: DC

## 2016-06-04 MED ORDER — 0.9 % SODIUM CHLORIDE (POUR BTL) OPTIME
TOPICAL | Status: DC | PRN
  Administered 2016-06-04: 1000 mL

## 2016-06-04 MED ORDER — THROMBIN 20000 UNITS EX SOLR
CUTANEOUS | Status: DC | PRN
  Administered 2016-06-04: 13:00:00 via TOPICAL

## 2016-06-04 MED ORDER — INSULIN GLARGINE 100 UNIT/ML ~~LOC~~ SOLN
10.0000 [IU] | Freq: Every day | SUBCUTANEOUS | Status: DC
  Administered 2016-06-04: 10 [IU] via SUBCUTANEOUS
  Filled 2016-06-04 (×2): qty 0.1

## 2016-06-04 MED ORDER — INSULIN ASPART 100 UNIT/ML ~~LOC~~ SOLN
0.0000 [IU] | Freq: Every day | SUBCUTANEOUS | Status: DC
  Administered 2016-06-04: 4 [IU] via SUBCUTANEOUS

## 2016-06-04 MED ORDER — ARTIFICIAL TEARS OP OINT
TOPICAL_OINTMENT | OPHTHALMIC | Status: DC | PRN
Start: 2016-06-04 — End: 2016-06-04
  Administered 2016-06-04: 1 via OPHTHALMIC

## 2016-06-04 MED ORDER — GELATIN ABSORBABLE MT POWD
OROMUCOSAL | Status: DC | PRN
  Administered 2016-06-04: 13:00:00 via TOPICAL

## 2016-06-04 MED ORDER — PROPOFOL 10 MG/ML IV BOLUS
INTRAVENOUS | Status: DC | PRN
  Administered 2016-06-04: 200 mg via INTRAVENOUS

## 2016-06-04 MED ORDER — OXYCODONE-ACETAMINOPHEN 5-325 MG PO TABS: 1.0000 | ORAL_TABLET | ORAL | Status: DC | PRN

## 2016-06-04 SURGICAL SUPPLY — 57 items
ADH SKN CLS APL DERMABOND .7 (GAUZE/BANDAGES/DRESSINGS) ×1
ALLOGRAFT CA 6X14X11 (Bone Implant) ×2 IMPLANT
BAG DECANTER FOR FLEXI CONT (MISCELLANEOUS) ×2 IMPLANT
BIT DRILL NEURO 2X3.1 SFT TUCH (MISCELLANEOUS) ×1 IMPLANT
BLADE ULTRA TIP 2M (BLADE) ×2 IMPLANT
BRUSH SCRUB EZ PLAIN DRY (MISCELLANEOUS) ×2 IMPLANT
CANISTER SUCT 3000ML PPV (MISCELLANEOUS) ×2 IMPLANT
COVER MAYO STAND STRL (DRAPES) ×2 IMPLANT
DECANTER SPIKE VIAL GLASS SM (MISCELLANEOUS) ×1 IMPLANT
DERMABOND ADVANCED (GAUZE/BANDAGES/DRESSINGS) ×1
DERMABOND ADVANCED .7 DNX12 (GAUZE/BANDAGES/DRESSINGS) ×1 IMPLANT
DRAPE HALF SHEET 40X57 (DRAPES) IMPLANT
DRAPE LAPAROTOMY 100X72 PEDS (DRAPES) ×2 IMPLANT
DRAPE MICROSCOPE LEICA (MISCELLANEOUS) ×2 IMPLANT
DRAPE POUCH INSTRU U-SHP 10X18 (DRAPES) ×2 IMPLANT
DRILL NEURO 2X3.1 SOFT TOUCH (MISCELLANEOUS) ×2
ELECT COATED BLADE 2.86 ST (ELECTRODE) ×2 IMPLANT
ELECT REM PT RETURN 9FT ADLT (ELECTROSURGICAL) ×2
ELECTRODE REM PT RTRN 9FT ADLT (ELECTROSURGICAL) ×1 IMPLANT
GLOVE BIO SURGEON STRL SZ 6.5 (GLOVE) ×2 IMPLANT
GLOVE BIO SURGEON STRL SZ7 (GLOVE) ×2 IMPLANT
GLOVE BIO SURGEON STRL SZ8 (GLOVE) ×1 IMPLANT
GLOVE BIOGEL PI IND STRL 7.0 (GLOVE) IMPLANT
GLOVE BIOGEL PI IND STRL 7.5 (GLOVE) IMPLANT
GLOVE BIOGEL PI IND STRL 8 (GLOVE) ×1 IMPLANT
GLOVE BIOGEL PI INDICATOR 7.0 (GLOVE) ×2
GLOVE BIOGEL PI INDICATOR 7.5 (GLOVE) ×2
GLOVE BIOGEL PI INDICATOR 8 (GLOVE) ×2
GLOVE ECLIPSE 7.5 STRL STRAW (GLOVE) ×3 IMPLANT
GLOVE INDICATOR 8.5 STRL (GLOVE) ×1 IMPLANT
GOWN STRL REUS W/ TWL LRG LVL3 (GOWN DISPOSABLE) IMPLANT
GOWN STRL REUS W/ TWL XL LVL3 (GOWN DISPOSABLE) IMPLANT
GOWN STRL REUS W/TWL 2XL LVL3 (GOWN DISPOSABLE) IMPLANT
GOWN STRL REUS W/TWL LRG LVL3 (GOWN DISPOSABLE)
GOWN STRL REUS W/TWL XL LVL3 (GOWN DISPOSABLE) ×14
HALTER HD/CHIN CERV TRACTION D (MISCELLANEOUS) ×2 IMPLANT
HEMOSTAT POWDER KIT SURGIFOAM (HEMOSTASIS) ×2 IMPLANT
KIT BASIN OR (CUSTOM PROCEDURE TRAY) ×2 IMPLANT
KIT ROOM TURNOVER OR (KITS) ×2 IMPLANT
NDL HYPO 25X1 1.5 SAFETY (NEEDLE) ×1 IMPLANT
NDL SPNL 22GX3.5 QUINCKE BK (NEEDLE) ×2 IMPLANT
NEEDLE HYPO 25X1 1.5 SAFETY (NEEDLE) ×2 IMPLANT
NEEDLE SPNL 22GX3.5 QUINCKE BK (NEEDLE) ×4 IMPLANT
NS IRRIG 1000ML POUR BTL (IV SOLUTION) ×2 IMPLANT
PACK LAMINECTOMY NEURO (CUSTOM PROCEDURE TRAY) ×2 IMPLANT
PAD ARMBOARD 7.5X6 YLW CONV (MISCELLANEOUS) ×6 IMPLANT
PLATE AVIATOR ASSY 2LVL SZ 32 (Plate) ×1 IMPLANT
RUBBERBAND STERILE (MISCELLANEOUS) ×4 IMPLANT
SCREW AVIATOR VAR SELFTAP 4X14 (Screw) ×6 IMPLANT
SPONGE INTESTINAL PEANUT (DISPOSABLE) ×3 IMPLANT
SPONGE SURGIFOAM ABS GEL 100 (HEMOSTASIS) ×2 IMPLANT
STAPLER SKIN PROX WIDE 3.9 (STAPLE) ×1 IMPLANT
SUT VIC AB 2-0 CP2 18 (SUTURE) ×2 IMPLANT
SUT VIC AB 3-0 SH 8-18 (SUTURE) ×2 IMPLANT
TOWEL OR 17X24 6PK STRL BLUE (TOWEL DISPOSABLE) ×2 IMPLANT
TOWEL OR 17X26 10 PK STRL BLUE (TOWEL DISPOSABLE) ×2 IMPLANT
WATER STERILE IRR 1000ML POUR (IV SOLUTION) ×2 IMPLANT

## 2016-06-04 NOTE — Progress Notes (Signed)
Vitals:   06/04/16 1630 06/04/16 1635 06/04/16 1700 06/04/16 1730  BP:   (!) 148/74 119/68  Pulse: (!) 103 99 (!) 101 93  Resp: 15 14 20 18   Temp:  97.9 F (36.6 C) 98.4 F (36.9 C)   TempSrc:      SpO2: 96% 94% 95%   Weight:        Patient resting in bed, has been up to the bathroom and voided. Comfortable. Wound clean and dry. Wearing soft cervical collar. Spoke with the patient and his nurse about the need to walk increasing distances in the hall several times this evening and again tomorrow. Discussed diabetic management with the patient and his nurse.  Plan: Encouraged to ambulate. Continue to progress through postoperative recovery.  Hosie Spangle, MD 06/04/2016, 6:39 PM

## 2016-06-04 NOTE — H&P (Signed)
Subjective: Patient is a 70 y.o. mixed handed/ambidextrous white male who is admitted for treatment of neck pain, cervicogenic headache, and bile cervical radiculopathy. Symptoms began a little over a month ago with disabling posterior neck pain, that extended into the occiput and up and over the vertex with pain extending into the shoulders, as well as down to the right arm the level of the elbow. Neurologic examination shows weakness of the right biceps 4/5. X-rays show multilevel cervical spondylosis and degenerative disc disease throughout the cervical spine as well as to the upper thoracic spine. There is an anterolisthesis of C4 and 5 that is mildly limited in flexion and extension. MRI scan shows marked stenosis at the C4-5 level, that is worse as compared to an MRI to half years ago, with significant degeneration also seen at the C3-4 level. Patient is admitted now for 2 level CIII-4 and C4-5 anterior cervical decompression and arthrodesis with structural allograft and cervical plating.   Patient Active Problem List   Diagnosis Date Noted  . Obesity 02/11/2016  . Spinal stenosis 12/19/2015  . Intractable back pain 12/19/2015  . Diabetes mellitus with complication ([place])   . Bruit 07/05/2015  . Type 2 diabetes mellitus, controlled ([place]) 03/29/2015  . Edema 01/02/2015  . SVT (supraventricular tachycardia) ([place]) 05/09/2014  . Obesity (BMI 30-39.9) 02/01/2014  . Polycythemia, secondary 11/02/2013  . Apnea, sleep 03/22/2013  . H/O cataract extraction 02/01/2013  . Blepharoptosis 02/01/2013  . Erectile dysfunction 01/13/2013  . Tobacco abuse 03/08/2012  . TIA (transient ischemic attack) 03/08/2012  . OSA (obstructive sleep apnea) 03/24/2011  . CAD (coronary artery disease) 03/05/2011  . Nonspecific abnormal unspecified cardiovascular function study 01/31/2011  . PALPITATIONS 01/01/2011  . COPD with emphysema ([place]) 01/01/2011  . Chest pain 01/01/2011  . BPH (benign prostatic hyperplasia)  12/31/2010  . OSTEOARTHRITIS 12/31/2010  . URTICARIA 02/06/2010  . DM 11/29/2009  . Dyslipidemia 11/29/2009  . Essential hypertension 11/29/2009  . WEIGHT GAIN 11/29/2009   Past Medical History:  Diagnosis Date  . BPH (benign prostatic hypertrophy)   . CAD (coronary artery disease)   . COPD (chronic obstructive pulmonary disease) ([place]) 05/01/2011   PFT 05/01/11>>FEV1 2.07(72%), FEV1% 65, TLC 6.79(111%), DLCO 69%, no BD  . DM (diabetes mellitus) ([place])   . DYSLIPIDEMIA 11/29/2009  . Dysrhythmia    SVT  . Exposure to TB 9 yrs ago   no active infection  . HYPERTENSION, ESSENTIAL 11/29/2009  . OSA (obstructive sleep apnea) 03/26/2011   CPAP 9 cm H2O  . Osteoarthritis   . SVT (supraventricular tachycardia) ([place])   . URTICARIA 02/06/2010    Past Surgical History:  Procedure Laterality Date  . APPENDECTOMY  1963  . BILATERAL CARPAL TUNNEL RELEASE Bilateral   . NASAL SINUS SURGERY  1989  . SPINE SURGERY     spinal fusion - L4, L5  . TOTAL KNEE ARTHROPLASTY Left 2007  . TRANSURETHRAL RESECTION OF PROSTATE     bph    Prescriptions Prior to Admission  Medication Sig Dispense Refill Last Dose  . aspirin 81 MG tablet Take 81 mg by mouth every evening.    Past Week at Unknown time  . Canagliflozin (INVOKANA) 300 MG TABS Take 1 tablet by mouth daily.   06/03/2016 at Unknown time  . CHROMIUM-CINNAMON PO Take 2,000 mg by mouth daily.   06/03/2016 at Unknown time  . Cyanocobalamin (VITAMIN B 12 PO) Take 1 tablet by mouth daily.   06/03/2016 at Unknown time  . furosemide (LASIX)  40 MG tablet Take 40 mg by mouth daily as needed (swelling).   06/03/2016 at Unknown time  . gabapentin (NEURONTIN) 300 MG capsule Take 300 mg by mouth 3 (three) times daily.   06/04/2016 at 0600  . glimepiride (AMARYL) 1 MG tablet Take 1 mg by mouth 2 (two) times daily. Reported on 11/21/2015  4 06/03/2016 at Unknown time  . LANTUS SOLOSTAR 100 UNIT/ML Solostar Pen Inject 10 Units into the skin daily at 10 pm.   1 06/03/2016 at  Unknown time  . metFORMIN (GLUCOPHAGE) 500 MG tablet Take 1,000 mg by mouth 2 (two) times daily with a meal.    06/03/2016 at Unknown time  . pravastatin (PRAVACHOL) 40 MG tablet TAKE ONE TABLET EVERY EVENING 90 tablet 1 06/03/2016 at Unknown time  . Turmeric 450 MG CAPS Take 450 mg by mouth daily.   06/03/2016 at Unknown time  . varenicline (CHANTIX CONTINUING MONTH PAK) 1 MG tablet Take 1 tablet (1 mg total) by mouth 2 (two) times daily. 60 tablet 2 06/03/2016 at Unknown time  . varenicline (CHANTIX) 0.5 MG tablet Take 1 tablet (0.5 mg total) by mouth 2 (two) times daily. 60 tablet 0 06/03/2016 at Unknown time  . verapamil (CALAN-SR) 240 MG CR tablet TAKE ONE TABLET EACH DAY 90 tablet 1 06/03/2016 at Unknown time  . clotrimazole-betamethasone (LOTRISONE) cream Apply 1 application topically 2 (two) times daily. (Patient not taking: Reported on 05/23/2016) 30 g 1 Not Taking at Unknown time   Allergies  Allergen Reactions  . Codeine Sulfate Hives  . Morphine And Related Hives    Oral morphine/ hives   . Propoxyphene N-Acetaminophen Hives  . Ramipril Hives  . Shellfish Allergy Hives    Social History  Substance Use Topics  . Smoking status: Former Smoker    Packs/day: 1.00    Years: 50.00    Types: Cigarettes    Quit date: 04/29/2016  . Smokeless tobacco: Former Systems developer     Comment: pt is again smoking < 1 PPD  . Alcohol use 0.0 oz/week     Comment: occasionallly    Family History  Problem Relation Age of Onset  . Prostate cancer Brother   . Cancer Father   . Arthritis Other   . Diabetes Other      Review of Systems A comprehensive review of systems was negative.  Objective: Vital signs in last 24 hours: Temp:  [98.4 F (36.9 C)] 98.4 F (36.9 C) (08/02 0947) Pulse Rate:  [97] 97 (08/02 0947) Resp:  [18] 18 (08/02 0947) BP: (117)/(94) 117/94 (08/02 0947) SpO2:  [98 %] 98 % (08/02 0947) Weight:  [118.5 kg (261 lb 3 oz)] 118.5 kg (261 lb 3 oz) (08/02 0947)  EXAM: Patient  well-developed well-nourished white male in no acute distress. Lungs are clear to auscultation , the patient has symmetrical respiratory excursion. Heart has a regular rate and rhythm normal S1 and S2 no murmur.   Abdomen is soft nontender nondistended bowel sounds are present. Extremity examination shows no clubbing or cyanosis, mild edema is found in the distal lower extremities. Motor examination shows weakness of the right biceps 4/5, otherwise there is 5/5 strength in the upper extremities including the deltoid bilaterally, the left biceps, and the triceps, intrinsics and grip bilaterally. Sensation is intact to pinprick throughout the digits of the upper extremities. Reflexes are symmetrical and without evidence of pathologic reflexes. Patient has a normal gait and stance.    Data Review:CBC    Component  Value Date/Time   WBC 9.1 05/29/2016 1333   RBC 5.67 05/29/2016 1333   HGB 16.8 05/29/2016 1333   HGB 17.5 (H) 02/10/2014 1053   HCT 52.2 (H) 05/29/2016 1333   HCT 53.2 (H) 02/10/2014 1053   PLT 235 05/29/2016 1333   PLT 275 02/10/2014 1053   MCV 92.1 05/29/2016 1333   MCV 91.5 02/10/2014 1053   MCH 29.6 05/29/2016 1333   MCHC 32.2 05/29/2016 1333   RDW 14.1 05/29/2016 1333   RDW 14.4 02/10/2014 1053   LYMPHSABS 1.7 12/19/2015 0800   LYMPHSABS 2.7 02/10/2014 1053   MONOABS 0.8 12/19/2015 0800   MONOABS 1.1 (H) 02/10/2014 1053   EOSABS 0.2 12/19/2015 0800   EOSABS 0.3 02/10/2014 1053   BASOSABS 0.0 12/19/2015 0800   BASOSABS 0.0 02/10/2014 1053                          BMET    Component Value Date/Time   NA 136 05/29/2016 1333   NA 142 05/12/2016   K 4.4 05/29/2016 1333   CL 100 (L) 05/29/2016 1333   CO2 30 05/29/2016 1333   GLUCOSE 181 (H) 05/29/2016 1333   BUN 12 05/29/2016 1333   BUN 16 05/12/2016   CREATININE 0.74 05/29/2016 1333   CALCIUM 9.3 05/29/2016 1333   GFRNONAA >60 05/29/2016 1333   GFRAA >60 05/29/2016 1333     Assessment/Plan: Patient presenting  with neck pain, cervicogenic headache, and bilateral cervical radiculopathy, right worse than left, with weakness of the right biceps 4/5, who has multilevel degenerative changes in the cervical spine and upper thoracic spine, with the worst changes seen at the C3-4 and C4-5 levels. He is admitted now for a 2 level C3-4 and C4-5 anterior cervical decompression and arthrodesis with structural allograft and cervical plating.  I've discussed with the patient the nature of his condition, the nature the surgical procedure, the typical length of surgery, hospital stay, and overall recuperation. We discussed limitations postoperatively. I discussed risks of surgery including risks of infection, bleeding, possibly need for transfusion, the risk of nerve root dysfunction with pain, weakness, numbness, or paresthesias, the risk of spinal cord dysfunction with paralysis of all 4 limbs and quadriplegia, and the risk of dural tear and CSF leakage and possible need for further surgery, the risk of esophageal dysfunction causing dysphagia and the risk of laryngeal dysfunction causing hoarseness of the voice, the risk of failure of the arthrodesis and the possible need for further surgery, and the risk of anesthetic complications including myocardial infarction, stroke, pneumonia, and death. We also discussed the need for postoperative immobilization in a cervical collar. Understanding all this the patient does wish to proceed with surgery and is admitted for such.    Hosie Spangle, MD 06/04/2016 11:05 AM

## 2016-06-04 NOTE — Op Note (Signed)
06/04/2016  3:00 PM  PATIENT:  Steve Charles  70 y.o. male  PRE-OPERATIVE DIAGNOSIS:  C3-4 and C4-5 cervical disc herniation, cervical spondylosis, cervical degenerative disease, and cervical radiculopathy  POST-OPERATIVE DIAGNOSIS:  C3-4 and C4-5 cervical disc herniation, cervical spondylosis, cervical degenerative disease, and cervical radiculopathy  PROCEDURE:  Procedure(s):  C3-4 and C4-5 anterior cervical decompression and arthrodesis with structural allograft and anterior cervical plating  SURGEON:  Surgeon(s): Jovita Gamma, MD Kary Kos, MD  ASSISTANTS: Lurline Hare, M.D.  ANESTHESIA:   general  EBL:  Total I/O In: 1250 [I.V.:1000; IV Piggyback:250] Out: -   BLOOD ADMINISTERED:none  COUNT: Correct per nursing staff  DICTATION: Patient was brought to the operating room placed under general endotracheal anesthesia. Patient was placed in 10 pounds of halter traction. The neck was prepped with Betadine soap and solution and draped in a sterile fashion. A horizontal incision was made on the left side of the neck. The line of the incision was infiltrated with local anesthetic with epinephrine. Dissection was carried down thru the subcutaneous tissue and platysma, bipolar cautery was used to maintain hemostasis. Dissection was then carried out thru an avascular plane leaving the sternocleidomastoid carotid artery and jugular vein laterally and the trachea and esophagus medially. The ventral aspect of the vertebral column was identified and a localizing x-ray was taken. The C3-4 and C4-5 levels were identified. The annulus at each level was incised and the disc space entered. Anterior spondylitic overgrowth was removed using Kerrison punches and the high-speed drill. Discectomy was performed with micro-curettes and pituitary rongeurs. The operating microscope was draped and brought into the field provided additional magnification illumination and visualization. Discectomy was continued  posteriorly thru the disc space and then the cartilaginous endplate was removed using micro-curettes along with the high-speed drill. Posterior osteophytic overgrowth was removed each level using the high-speed drill along with a 2 mm thin footplated Kerrison punch. Posterior longitudinal ligament along with disc herniation was carefully removed at the C3-4 level, and much of it was removed at the C4-5, but a portion was partially calcified and densely adherent to the thecal sac and could not be removed without causing a durotomy.  We were able to decompress the spinal canal and thecal sac. We then continued to remove osteophytic overgrowth and disc material decompressing the neural foramina and exiting nerve roots bilaterally. At the C4-5 level that was a significant amount of soft disc herniation both within the posterior arch ligament but also in the neural foramen, and by removing it we decompressed the exiting C5 nerve roots. Once the decompression was completed hemostasis was established at each level with the use of Gelfoam with thrombin and bipolar cautery. The Gelfoam was removed the wound irrigated and hemostasis confirmed. We then placed a thin layer of Surgifoam. We then measured the height of the intravertebral disc space level and selected a 6 millimeter in height structural allograft for the C3-4 level and a 6 millimeter in height structural allograft for the C4-5 level . Each was hydrated and saline solution and then gently positioned in the intravertebral disc space and countersunk. We then selected a 32 millimeter in height Aviator cervical plate. It was positioned over the fusion construct and secured to the vertebra with a pair of 4 x 14 mm self-tapping variable screws at the C3, C4, and C5 levels. Each screw hole was started with the high-speed drill and then the screws placed, once all the screws were placed, the locking system was secured.  The wound was irrigated with bacitracin solution  checked for hemostasis which was established and confirmed. An x-ray was taken which showed grafts in good position, plate and screws in good position, and the overall alignment looked good. We then proceeded with closure. The platysma was closed with interrupted inverted 2-0 undyed Vicryl suture, the subcutaneous and subcuticular closed with interrupted inverted 3-0 undyed Vicryl suture. The skin edges were approximated with Dermabond. Following surgery the patient was taken out of cervical traction. To be reversed and the anesthetic and taken to the recovery room for further care.  PLAN OF CARE: Admit for overnight observation  PATIENT DISPOSITION:  PACU - hemodynamically stable.   Delay start of Pharmacological VTE agent (>24hrs) due to surgical blood loss or risk of bleeding:  yes

## 2016-06-04 NOTE — Progress Notes (Signed)
MD came by to assess patient after surgery. MD asked that patient be walked in hallway at least 2 times tonight. RN walked with patient down hallway to [employer] and back as instructed by MD. MD also stated that patient can be off bed alarm and that IV fluids can be d/c'd post dinner. MD also stated that patient needed to be walked in hallway during morning at least once.

## 2016-06-04 NOTE — Anesthesia Procedure Notes (Signed)
Procedure Name: Intubation Date/Time: 06/04/2016 11:42 AM Performed by: Willeen Cass P Pre-anesthesia Checklist: Patient identified, Emergency Drugs available, Suction available, Patient being monitored and Timeout performed Patient Re-evaluated:Patient Re-evaluated prior to inductionOxygen Delivery Method: Circle system utilized Preoxygenation: Pre-oxygenation with 100% oxygen Intubation Type: IV induction Ventilation: Mask ventilation without difficulty Laryngoscope size: glidescope (large adult)-- elective d/t limited neck ROM and pain preop. Grade View: Grade I Tube type: Oral Tube size: 7.5 mm Number of attempts: 1 Airway Equipment and Method: Video-laryngoscopy and Rigid stylet Placement Confirmation: ETT inserted through vocal cords under direct vision,  positive ETCO2 and breath sounds checked- equal and bilateral Secured at: 24 cm Tube secured with: Tape Dental Injury: Teeth and Oropharynx as per pre-operative assessment

## 2016-06-04 NOTE — Transfer of Care (Signed)
Immediate Anesthesia Transfer of Care Note  Patient: Steve Charles  Procedure(s) Performed: Procedure(s): CERVICAL THREE-FOUR, CERVICAL FOUR-FIVE ANTERIOR CERVICAL DECOMPRESSION/DISCECTOMY FUSION (N/A)  Patient Location: PACU  Anesthesia Type:General  Level of Consciousness: awake, alert , oriented and patient cooperative  Airway & Oxygen Therapy: Patient Spontanous Breathing and Patient connected to nasal cannula oxygen  Post-op Assessment: Report given to RN, Post -op Vital signs reviewed and stable and Patient moving all extremities  Post vital signs: Reviewed and stable  Last Vitals:  Vitals:   06/04/16 0947  BP: (!) 117/94  Pulse: 97  Resp: 18  Temp: 36.9 C    Last Pain:  Vitals:   06/04/16 0947  TempSrc: Oral  PainSc: 7       Patients Stated Pain Goal: 3 ([id number])  Complications: No apparent anesthesia complications

## 2016-06-04 NOTE — Anesthesia Preprocedure Evaluation (Addendum)
Anesthesia Evaluation  Patient identified by MRN, date of birth, ID band Patient awake    Reviewed: Allergy & Precautions, H&P , Patient's Chart, lab work & pertinent test results, reviewed documented beta blocker date and time   Airway Mallampati: II  TM Distance: >3 FB Neck ROM: full    Dental no notable dental hx. (+) Dental Advisory Given   Pulmonary former smoker,    Pulmonary exam normal breath sounds clear to auscultation       Cardiovascular  Rhythm:regular Rate:Normal     Neuro/Psych    GI/Hepatic   Endo/Other  diabetesMorbid obesity  Renal/GU      Musculoskeletal   Abdominal   Peds  Hematology   Anesthesia Other Findings HYPERTENSION.... Normal EF from echo and Nuclear medicine DM   OSA  CPAP 9 cm H2O  COPD (chronic obstructive pulmonary disease)  FEV1 2.07(72%), FEV1% 65, TLC 6.79(111%), DLCO 69%, no BD CAD  Normal stress test Dysrhythmia.....Marland KitchenSVT verapamil      Reproductive/Obstetrics                           Anesthesia Physical Anesthesia Plan  ASA: III  Anesthesia Plan: General   Post-op Pain Management:    Induction: Intravenous  Airway Management Planned: Oral ETT  Additional Equipment:   Intra-op Plan:   Post-operative Plan: Extubation in OR  Informed Consent: I have reviewed the patients History and Physical, chart, labs and discussed the procedure including the risks, benefits and alternatives for the proposed anesthesia with the patient or authorized representative who has indicated his/her understanding and acceptance.   Dental Advisory Given and Dental advisory given  Plan Discussed with: CRNA and Surgeon  Anesthesia Plan Comments: (  Discussed general anesthesia, including possible nausea, instrumentation of airway, sore throat,pulmonary aspiration, etc. I asked if the were any outstanding questions, or  concerns before we proceeded. )         Anesthesia Quick Evaluation

## 2016-06-05 ENCOUNTER — Encounter (HOSPITAL_COMMUNITY): Payer: Self-pay | Admitting: Neurosurgery

## 2016-06-05 DIAGNOSIS — E119 Type 2 diabetes mellitus without complications: Secondary | ICD-10-CM | POA: Diagnosis not present

## 2016-06-05 DIAGNOSIS — J449 Chronic obstructive pulmonary disease, unspecified: Secondary | ICD-10-CM | POA: Diagnosis not present

## 2016-06-05 DIAGNOSIS — M50121 Cervical disc disorder at C4-C5 level with radiculopathy: Secondary | ICD-10-CM | POA: Diagnosis not present

## 2016-06-05 DIAGNOSIS — G4733 Obstructive sleep apnea (adult) (pediatric): Secondary | ICD-10-CM | POA: Diagnosis not present

## 2016-06-05 DIAGNOSIS — Z87891 Personal history of nicotine dependence: Secondary | ICD-10-CM | POA: Diagnosis not present

## 2016-06-05 LAB — GLUCOSE, CAPILLARY
Glucose-Capillary: 251 mg/dL — ABNORMAL HIGH (ref 65–99)
Glucose-Capillary: 167 mg/dL — ABNORMAL HIGH (ref 65–99)

## 2016-06-05 MED ORDER — DEXAMETHASONE 4 MG PO TABS: 4.0000 mg | ORAL_TABLET | Freq: Two times a day (BID) | ORAL | 0 refills | Status: DC

## 2016-06-05 MED ORDER — TRAMADOL HCL 50 MG PO TABS: 50.0000 mg | ORAL_TABLET | Freq: Four times a day (QID) | ORAL | 0 refills | Status: DC | PRN

## 2016-06-05 MED ORDER — DEXAMETHASONE SODIUM PHOSPHATE 4 MG/ML IJ SOLN
4.0000 mg | Freq: Once | INTRAMUSCULAR | Status: AC
  Administered 2016-06-05: 4 mg via INTRAVENOUS
  Filled 2016-06-05: qty 1

## 2016-06-05 NOTE — Care Management Note (Signed)
Case Management Note  Patient Details  Name: Steve Charles MRN: [id number] Date of Birth: [birth year]  Subjective/Objective:                    Action/Plan: Pt discharging home with self care. No further needs per CM.   Expected Discharge Date:                  Expected Discharge Plan:  Home/Self Care  In-House Referral:     Discharge planning Services     Post Acute Care Choice:    Choice offered to:     DME Arranged:    DME Agency:     HH Arranged:    [place] Agency:     Status of Service:  Completed, signed off  If discussed at [employer] of Stay Meetings, dates discussed:    Additional Comments:  Pollie Friar, RN 06/05/2016, 1:41 PM

## 2016-06-05 NOTE — Progress Notes (Signed)
Discharge orders received, Pt for discharge home today. IV d/c'd. D/c instructions and RX given with verbalized understanding. Family at bedside to assist patient with discharge. Staff bought pt downstairs via wheelchair.

## 2016-06-05 NOTE — Care Management Obs Status (Addendum)
[place] NOTIFICATION   Patient Details  Name: Steve Charles MRN: [id number] Date of Birth: 1946-04-09   Medicare Observation Status Notification Given:  Yes    Pollie Friar, RN 06/05/2016, 4:23 PM

## 2016-06-05 NOTE — Anesthesia Postprocedure Evaluation (Signed)
Anesthesia Post Note  Patient: Steve Charles  Procedure(s) Performed: Procedure(s) (LRB): CERVICAL THREE-FOUR, CERVICAL FOUR-FIVE ANTERIOR CERVICAL DECOMPRESSION/DISCECTOMY FUSION (N/A)  Anesthesia Post Evaluation  Last Vitals:  Vitals:   06/05/16 0140 06/05/16 0549  BP: 108/69 122/61  Pulse: (!) 119 91  Resp: 19 18  Temp: 36.9 C 36.9 C    Last Pain:  Vitals:   06/05/16 0933  TempSrc:   PainSc: 1                  Aldric Wenzler EDWARD

## 2016-06-05 NOTE — Discharge Summary (Signed)
Physician Discharge Summary  Patient ID: Steve Charles MRN: [id number] DOB/AGE: [birth year] 70 y.o.  Admit date: 06/04/2016 Discharge date: 06/05/2016  Admission Diagnoses:  C3-4 and C4-5 cervical disc herniation, cervical spondylosis, cervical degenerative disease, and cervical radiculopathy  Discharge Diagnoses:  C3-4 and C4-5 cervical disc herniation, cervical spondylosis, cervical degenerative disease, and cervical radiculopathy Active Problems:   HNP (herniated nucleus pulposus), cervical   Discharged Condition: good  Hospital Course: Patient admitted, underwent a 2 level C3-4 and C4-5 ACDF with structural allograft and aviator cervical plating. Postoperatively he has had excellent relief of his neck pain and radicular pain. He's had mild dysphagia and hoarseness of the voice, but denies dyspnea or shortness of breath. He has moderate swelling in the neck, and significant ecchymosis in the soft tissues of the neck. He is anxious to be discharged home, but I explained to him the swelling may worsen over the next few days. We discussed the option of treating the swelling with dexamethasone, realizing that we'll exacerbate his blood sugar control. He feels that he is going to be able to manage his blood sugars and would like to use the dexamethasone, and we'll going to give him 4 mg IV prior to discharge and prescribed 4 mg twice a day for nine doses.  He has not used any pain medication since surgery, I offered to prescribe hydrocodone which he declined, he has agreed to a prescription for tramadol. He has been given instructions regarding wound care and activities following discharge. He is scheduled to follow-up with me in the office in a little less than 3 weeks.  Discharge Exam: Blood pressure 122/61, pulse 91, temperature 98.5 F (36.9 C), temperature source Oral, resp. rate 18, weight 118.5 kg (261 lb 3 oz), SpO2 96 %.  Disposition: 01-Home or Self Care     Medication List    STOP  taking these medications   clotrimazole-betamethasone cream Commonly known as:  LOTRISONE     TAKE these medications   aspirin 81 MG tablet Take 81 mg by mouth every evening.   CHROMIUM-CINNAMON PO Take 2,000 mg by mouth daily.   dexamethasone 4 MG tablet Commonly known as:  DECADRON Take 1 tablet (4 mg total) by mouth 2 (two) times daily with a meal.   furosemide 40 MG tablet Commonly known as:  LASIX Take 40 mg by mouth daily as needed (swelling).   gabapentin 300 MG capsule Commonly known as:  NEURONTIN Take 300 mg by mouth 3 (three) times daily.   glimepiride 1 MG tablet Commonly known as:  AMARYL Take 1 mg by mouth 2 (two) times daily. Reported on 11/21/2015   INVOKANA 300 MG Tabs tablet Generic drug:  canagliflozin Take 1 tablet by mouth daily.   LANTUS SOLOSTAR 100 UNIT/ML Solostar Pen Generic drug:  Insulin Glargine Inject 10 Units into the skin daily at 10 pm.   metFORMIN 500 MG tablet Commonly known as:  GLUCOPHAGE Take 1,000 mg by mouth 2 (two) times daily with a meal.   pravastatin 40 MG tablet Commonly known as:  PRAVACHOL TAKE ONE TABLET EVERY EVENING   traMADol 50 MG tablet Commonly known as:  ULTRAM Take 1 tablet (50 mg total) by mouth every 6 (six) hours as needed (pain).   Turmeric 450 MG Caps Take 450 mg by mouth daily.   varenicline 0.5 MG tablet Commonly known as:  CHANTIX Take 1 tablet (0.5 mg total) by mouth 2 (two) times daily.   varenicline 1 MG tablet Commonly known  as:  CHANTIX CONTINUING MONTH PAK Take 1 tablet (1 mg total) by mouth 2 (two) times daily.   verapamil 240 MG CR tablet Commonly known as:  CALAN-SR TAKE ONE TABLET EACH DAY   VITAMIN B 12 PO Take 1 tablet by mouth daily.        SignedHosie Spangle 06/05/2016, 1:36 PM

## 2016-06-13 ENCOUNTER — Encounter: Payer: Medicare Other | Attending: Family Medicine | Admitting: *Deleted

## 2016-06-13 DIAGNOSIS — Z029 Encounter for administrative examinations, unspecified: Secondary | ICD-10-CM | POA: Diagnosis present

## 2016-06-13 DIAGNOSIS — E118 Type 2 diabetes mellitus with unspecified complications: Secondary | ICD-10-CM

## 2016-06-16 ENCOUNTER — Encounter: Payer: Self-pay | Admitting: Family Medicine

## 2016-06-16 ENCOUNTER — Ambulatory Visit (INDEPENDENT_AMBULATORY_CARE_PROVIDER_SITE_OTHER): Payer: Medicare Other | Admitting: Family Medicine

## 2016-06-16 VITALS — BP 110/70 | HR 88 | Temp 97.7°F | Ht 68.0 in | Wt 252.0 lb

## 2016-06-16 DIAGNOSIS — F5102 Adjustment insomnia: Secondary | ICD-10-CM

## 2016-06-16 DIAGNOSIS — I2510 Atherosclerotic heart disease of native coronary artery without angina pectoris: Secondary | ICD-10-CM

## 2016-06-16 NOTE — Progress Notes (Signed)
Subjective:     Patient ID: Steve Charles, male   DOB: 03/20/1946, 70 y.o.   MRN: [id number]  HPI Patient here to discuss insomnia issues. Going on for about 3 months. He's had tremendous neck pains recently and just had C3-4 and C4-5 anterior cervical decompression and arthrodesis with structural allograft and anterior cervical plating. He noticed immediate relief of pain following surgery.  For at least couple months even prior surgery he's had difficulty with falling asleep and especially staying asleep. Sometimes only gets 2-3 hours per night. No regular alcohol use. He states minimal caffeine use though his daughter thinks he may be drinking some diet: Was that are caffeinated. He does frequently daytime nap. He is also finishing up taper of Decadron that he was placed on per neurosurgeon which may be exacerbating his sleep currently. He started melatonin several nights ago and did see some slight improvement. Just last night titrated dosage to 5 mg 2 tablets.  Past Medical History:  Diagnosis Date  . Arthritis    "entire spine; both of my hands" (06/04/2016)  . BPH (benign prostatic hypertrophy)   . CAD (coronary artery disease)   . Chronic lower back pain   . COPD (chronic obstructive pulmonary disease) ([place]) 05/01/2011   PFT 05/01/11>>FEV1 2.07(72%), FEV1% 65, TLC 6.79(111%), DLCO 69%, no BD  . DYSLIPIDEMIA 11/29/2009  . Dysrhythmia    SVT  . Exposure to TB 9 yrs ago   no active infection  . HYPERTENSION, ESSENTIAL 11/29/2009  . OSA on CPAP 03/26/2011   CPAP 9 cm H2O  . Osteoarthritis   . Pneumonia 1990s  . SVT (supraventricular tachycardia) ([place])   . Type II diabetes mellitus ([place])   . URTICARIA 02/06/2010   Past Surgical History:  Procedure Laterality Date  . ANTERIOR CERVICAL DECOMP/DISCECTOMY FUSION  06/04/2016   C3-4 and C4-5 anterior cervical decompression and arthrodesis with structural allograft and anterior cervical plating  . ANTERIOR CERVICAL DECOMP/DISCECTOMY  FUSION N/A 06/04/2016   Procedure: CERVICAL THREE-FOUR, CERVICAL FOUR-FIVE ANTERIOR CERVICAL DECOMPRESSION/DISCECTOMY FUSION;  Surgeon: Jovita Gamma, MD;  Location: [facility];  Service: Neurosurgery;  Laterality: N/A;  . APPENDECTOMY  1963  . BACK SURGERY    . BILATERAL CARPAL TUNNEL RELEASE Bilateral   . CARDIAC CATHETERIZATION     "[facility]"  . CATARACT EXTRACTION W/ INTRAOCULAR LENS  IMPLANT, BILATERAL Bilateral   . JOINT REPLACEMENT    . NASAL SINUS SURGERY  1989  . POSTERIOR LUMBAR FUSION     L4, L5  . TOTAL KNEE ARTHROPLASTY Left 2007  . TRANSURETHRAL RESECTION OF PROSTATE     bph  . ULNAR TUNNEL RELEASE Right     reports that he quit smoking about 6 weeks ago. His smoking use included Cigarettes. He has a 55.00 pack-year smoking history. He has never used smokeless tobacco. He reports that he drinks about 0.6 oz of alcohol per week . He reports that he does not use drugs. family history includes Arthritis in his other; Cancer in his father; Diabetes in his other; Prostate cancer in his brother. Allergies  Allergen Reactions  . Codeine Sulfate Hives  . Morphine And Related Hives    Oral morphine/ hives   . Propoxyphene N-Acetaminophen Hives  . Ramipril Hives  . Shellfish Allergy Hives     Review of Systems  Respiratory: Negative for shortness of breath.   Cardiovascular: Negative for chest pain.  Neurological: Negative for headaches.  Psychiatric/Behavioral: Positive for sleep disturbance. Negative for agitation and dysphoric mood.  The patient is not nervous/anxious.        Objective:   Physical Exam  Constitutional: He is oriented to person, place, and time. He appears well-developed and well-nourished. No distress.  Neck:  Patient has significant ecchymosis and hematoma left side of neck from recent surgery. Nontender. No warmth. No cellulitis changes.  Cardiovascular: Normal rate and regular rhythm.   Pulmonary/Chest: Effort normal and breath sounds normal.  No respiratory distress. He has no wheezes. He has no rales.  Neurological: He is alert and oriented to person, place, and time. No cranial nerve deficit.  Psychiatric: He has a normal mood and affect. His behavior is normal. Judgment and thought content normal.       Assessment:     Insomnia. Probably several factors contributing including current use of Decadron, daytime napping, caffeine use    Plan:     -Continue melatonin with recent increase in dose just yesterday and he is aware this may take a few days to see effect -Sleep hygiene discussed with handout given -Hopefully will improve as he comes off Decadron tomorrow -Avoid daytime napping -Avoid caffeine use or alcohol at night -If not improved with all of the above over the next few weeks consider trial of low-dose trazodone  [name] MD [facility]

## 2016-06-16 NOTE — Patient Instructions (Signed)
Insomnia Insomnia is a sleep disorder that makes it difficult to fall asleep or to stay asleep. Insomnia can cause tiredness (fatigue), low energy, difficulty concentrating, mood swings, and poor performance at work or school.  There are three different ways to classify insomnia:  Difficulty falling asleep.  Difficulty staying asleep.  Waking up too early in the morning. Any type of insomnia can be long-term (chronic) or short-term (acute). Both are common. Short-term insomnia usually lasts for three months or less. Chronic insomnia occurs at least three times a week for longer than three months. CAUSES  Insomnia may be caused by another condition, situation, or substance, such as:  Anxiety.  Certain medicines.  Gastroesophageal reflux disease (GERD) or other gastrointestinal conditions.  Asthma or other breathing conditions.  Restless legs syndrome, sleep apnea, or other sleep disorders.  Chronic pain.  Menopause. This may include hot flashes.  Stroke.  Abuse of alcohol, tobacco, or illegal drugs.  Depression.  Caffeine.   Neurological disorders, such as Alzheimer disease.  An overactive thyroid (hyperthyroidism). The cause of insomnia may not be known. RISK FACTORS Risk factors for insomnia include:  Gender. Women are more commonly affected than men.  Age. Insomnia is more common as you get older.  Stress. This may involve your professional or personal life.  Income. Insomnia is more common in people with lower income.  Lack of exercise.   Irregular work schedule or night shifts.  Traveling between different time zones. SIGNS AND SYMPTOMS If you have insomnia, trouble falling asleep or trouble staying asleep is the main symptom. This may lead to other symptoms, such as:  Feeling fatigued.  Feeling nervous about going to sleep.  Not feeling rested in the morning.  Having trouble concentrating.  Feeling irritable, anxious, or depressed. TREATMENT   Treatment for insomnia depends on the cause. If your insomnia is caused by an underlying condition, treatment will focus on addressing the condition. Treatment may also include:   Medicines to help you sleep.  Counseling or therapy.  Lifestyle adjustments. HOME CARE INSTRUCTIONS   Take medicines only as directed by your health care provider.  Keep regular sleeping and waking hours. Avoid naps.  Keep a sleep diary to help you and your health care provider figure out what could be causing your insomnia. Include:   When you sleep.  When you wake up during the night.  How well you sleep.   How rested you feel the next day.  Any side effects of medicines you are taking.  What you eat and drink.   Make your bedroom a comfortable place where it is easy to fall asleep:  Put up shades or special blackout curtains to block light from outside.  Use a white noise machine to block noise.  Keep the temperature cool.   Exercise regularly as directed by your health care provider. Avoid exercising right before bedtime.  Use relaxation techniques to manage stress. Ask your health care provider to suggest some techniques that may work well for you. These may include:  Breathing exercises.  Routines to release muscle tension.  Visualizing peaceful scenes.  Cut back on alcohol, caffeinated beverages, and cigarettes, especially close to bedtime. These can disrupt your sleep.  Do not overeat or eat spicy foods right before bedtime. This can lead to digestive discomfort that can make it hard for you to sleep.  Limit screen use before bedtime. This includes:  Watching TV.  Using your smartphone, tablet, and computer.  Stick to a routine. This   can help you fall asleep faster. Try to do a quiet activity, brush your teeth, and go to bed at the same time each night.  Get out of bed if you are still awake after 15 minutes of trying to sleep. Keep the lights down, but try reading or  doing a quiet activity. When you feel sleepy, go back to bed.  Make sure that you drive carefully. Avoid driving if you feel very sleepy.  Keep all follow-up appointments as directed by your health care provider. This is important. SEEK MEDICAL CARE IF:   You are tired throughout the day or have trouble in your daily routine due to sleepiness.  You continue to have sleep problems or your sleep problems get worse. SEEK IMMEDIATE MEDICAL CARE IF:   You have serious thoughts about hurting yourself or someone else.   This information is not intended to replace advice given to you by your health care provider. Make sure you discuss any questions you have with your health care provider.   Document Released: 10/17/2000 Document Revised: 07/11/2015 Document Reviewed: 07/21/2014 Elsevier Interactive Patient Education 2016 [employer]  

## 2016-06-16 NOTE — Progress Notes (Signed)
Pre visit review using our clinic review tool, if applicable. No additional management support is needed unless otherwise documented below in the visit note. 

## 2016-06-24 DIAGNOSIS — M5030 Other cervical disc degeneration, unspecified cervical region: Secondary | ICD-10-CM | POA: Diagnosis not present

## 2016-06-24 DIAGNOSIS — M4722 Other spondylosis with radiculopathy, cervical region: Secondary | ICD-10-CM | POA: Diagnosis not present

## 2016-06-24 DIAGNOSIS — Z6838 Body mass index (BMI) 38.0-38.9, adult: Secondary | ICD-10-CM | POA: Diagnosis not present

## 2016-06-24 DIAGNOSIS — M5020 Other cervical disc displacement, unspecified cervical region: Secondary | ICD-10-CM | POA: Diagnosis not present

## 2016-06-24 DIAGNOSIS — Z981 Arthrodesis status: Secondary | ICD-10-CM | POA: Diagnosis not present

## 2016-06-26 NOTE — Patient Instructions (Signed)
Plan:  Continue to aim for 3 Carb Choices per meal (45 grams) +/- 1 either way  Continue to aim for 0-2 Carbs per snack if hungry  Continue reading food labels for Total Carbohydrate and Fat Grams of foods Continue with your activity level daily as tolerated Continue checking BG at alternate times per day on occasion Continue taking Lantus at night as directed by your MD     

## 2016-06-26 NOTE — Progress Notes (Signed)
Medical Nutrition Therapy: 06/13/2016  Appt start time:  0730    end time:  0800  Patient states his last A1c was 7.0 % but states his MD explained it is probably lower due to him having an elevated Hgb levels  Assessment:  Primary concerns today: patient here for follow up diabetes education. He states that since he was hospitalized for severe back and leg pain he has been able to resume some of his walking. He has rejoined the [facility] and plans to go several days a week. He is here with his grand daughter who lives in [place], and she is assisting him by driving him to appointments. She had several questions regarding his management of his Diabetes, which I answered.   TANITA  BODY COMP RESULTS on 07/21/13  Weight 255.5 lb   BMI (kg/m^2) 38.8   Fat Mass (lbs) 97.5 lb   Fat Free Mass (lbs) 158.0 lb   Total Body Water (lbs) 115.5 lb   TANITA  BODY COMP RESULTS on 10/21/13  Weight 257.5 lb   BMI (kg/m^2) 39.2 lb   Fat Mass (lbs) 97.5 lb   Fat Free Mass (lbs) 160.0 lb   Total Body Water (lbs) 117.0 lb   TANITA  BODY COMP RESULTS on 01/20/14  Weight 248.5 lb   BMI (kg/m^2) 37.8   Fat Mass (lbs) 96.0 lb   Fat Free Mass (lbs) 152.5 lb   Total Body Water (lbs) 111.5 lb   TANITA  BODY COMP RESULTS  Weight 250.0 lb   BMI (kg/m^2) 38.0   Fat Mass (lbs) 100.0 lb   Fat Free Mass (lbs) 150.0 lb   Total Body Water (lbs) 110.0 lb   TANITA  BODY COMP RESULTS  07/27/2014 Weight 249.0 lb   BMI (kg/m^2) 37.9   Fat Mass (lbs) 103.5   Fat Free Mass (lbs) 145.5   Total Body Water (lbs) 106.5 lb   TANITA  BODY COMP RESULTS 10/12/2014  Weight 251 lb   BMI (kg/m^2) 38.2   Fat Mass (lbs) 92.0 lb   Fat Free Mass (lbs) 159.0 lb   Total Body Water (lbs) 116.5 lb   TANITA  BODY COMP RESULTS 12/07/2014   Weight 258 lb   BMI (kg/m^2) 39.2   Fat Mass (lbs) 86.5 lb   Fat Free Mass (lbs) 171.5 lb   Total Body Water (lbs) 125.5 lb    TANITA  BODY COMP RESULTS 05/10/15  Weight (lbs) 241.0 lb   BMI  (kg/m^2) 36.6   Fat Mass (lbs) 93.5   Fat Free Mass (lbs) 147.5   Total Body Water (lbs) 108.0   TANITA  BODY COMP RESULTS 08/30/15  Weight Lbs) 235.0 lb   BMI (kg/m^2) 35.7   Fat Mass (lbs) 84.5   Fat Free Mass (lbs) 150.5   Total Body Water (lbs) 110.0   TANITA  BODY COMP RESULTS 12/06/15  Weight (lbs) 237.5   BMI (kg/m^2) 36.1   Fat Mass (lbs) 89.0   Fat Free Mass (lbs) 148.5   Total Body Water (lbs) 108.5   TANITA  BODY COMP RESULTS 03/07/16  Weight (lbs) 252.5 lb   BMI (kg/m^2) 38.4   Fat Mass (lbs) 95.5   Fat Free Mass (lbs) 157.0   Total Body Water (lbs) 115.0   TANITA  BODY COMP RESULTS 06/13/2016  Weight (lbs) 250.0   BMI (kg/m^2) 38.0   Fat Mass (lbs) 93.0   Fat Free Mass (lbs) 157.0   Total Body Water (lbs) 115.0  Weigh t loss of 2.5 pounds of fat mass noted.   MEDICATIONS: see list, Diabetes medications are now Invokana, Metformin and Lantus insulin   Usual physical activity: increasing to normal levels gradually    Intervention: Acknowledged his continued success with controlling his BG effectively and continuing to work on weight loss  Plan:  Continue to aim for 3 Carb Choices per meal (45 grams) +/- 1 either way  Continue to aim for 0-2 Carbs per snack if hungry  Continue reading food labels for Total Carbohydrate and Fat Grams of foods Continue with your activity level daily as tolerated Continue checking BG at alternate times per day on occasion Continue taking Lantus at night as directed by your MD    Handouts given during visit include: [place] Scale results handout   Monitoring/Evaluation:  Dietary intake, exercise, reading food labels, and body weight in 3 months.

## 2016-07-02 NOTE — Progress Notes (Signed)
HPI: FU CAD and SVT. Patient states that he has had atrial fibrillation after surgeries in the past. Cardiac cath in April of 2012 revealed normal LV function, normal pulmonary pressures and pulmonary capillary wedge pressure and nonobstructive coronary disease with the most significant lesion being a 50% LAD. Carotid Dopplers in May of 2013 were normal. Echocardiogram repeated in Jan of 2015. This revealed normal LV function, grade 1 diastolic dysfunction. Nuclear study 7/15 showed EF 64 and normal perfusion. Abdominal ultrasound October 2016 showed no aneurysm. Patient also with SVT treated with verapamil. Holter monitor November 2016 showed sinus rhythm with frequent PVCs and PACs. There are occasional ventricular couplets. Since last seen, the patient denies any dyspnea on exertion, orthopnea, PND, pedal edema, palpitations, syncope or chest pain.   Current Outpatient Prescriptions  Medication Sig Dispense Refill  . aspirin 81 MG tablet Take 81 mg by mouth every evening.     . Canagliflozin (INVOKANA) 300 MG TABS Take 1 tablet by mouth daily.    . CHROMIUM-CINNAMON PO Take 2,000 mg by mouth daily.    . Cyanocobalamin (VITAMIN B 12 PO) Take 1 tablet by mouth daily.    . furosemide (LASIX) 40 MG tablet Take 40 mg by mouth daily as needed (swelling).    . gabapentin (NEURONTIN) 300 MG capsule Take 300 mg by mouth 3 (three) times daily.    Marland Kitchen glimepiride (AMARYL) 1 MG tablet Take 1 mg by mouth 2 (two) times daily. Reported on 11/21/2015  4  . LANTUS SOLOSTAR 100 UNIT/ML Solostar Pen Inject 10 Units into the skin daily at 10 pm.   1  . metFORMIN (GLUCOPHAGE) 500 MG tablet Take 1,000 mg by mouth 2 (two) times daily with a meal.     . pravastatin (PRAVACHOL) 40 MG tablet TAKE ONE TABLET EVERY EVENING 90 tablet 1  . Turmeric 450 MG CAPS Take 450 mg by mouth daily.    . verapamil (CALAN-SR) 240 MG CR tablet TAKE ONE TABLET EACH DAY 90 tablet 1   No current facility-administered medications for  this visit.      Past Medical History:  Diagnosis Date  . Arthritis    "entire spine; both of my hands" (06/04/2016)  . BPH (benign prostatic hypertrophy)   . CAD (coronary artery disease)   . Chronic lower back pain   . COPD (chronic obstructive pulmonary disease) ([place]) 05/01/2011   PFT 05/01/11>>FEV1 2.07(72%), FEV1% 65, TLC 6.79(111%), DLCO 69%, no BD  . DYSLIPIDEMIA 11/29/2009  . Dysrhythmia    SVT  . Exposure to TB 9 yrs ago   no active infection  . HYPERTENSION, ESSENTIAL 11/29/2009  . OSA on CPAP 03/26/2011   CPAP 9 cm H2O  . Osteoarthritis   . Pneumonia 1990s  . SVT (supraventricular tachycardia) ([place])   . Type II diabetes mellitus ([place])   . URTICARIA 02/06/2010    Past Surgical History:  Procedure Laterality Date  . ANTERIOR CERVICAL DECOMP/DISCECTOMY FUSION  06/04/2016   C3-4 and C4-5 anterior cervical decompression and arthrodesis with structural allograft and anterior cervical plating  . ANTERIOR CERVICAL DECOMP/DISCECTOMY FUSION N/A 06/04/2016   Procedure: CERVICAL THREE-FOUR, CERVICAL FOUR-FIVE ANTERIOR CERVICAL DECOMPRESSION/DISCECTOMY FUSION;  Surgeon: Jovita Gamma, MD;  Location: [facility];  Service: Neurosurgery;  Laterality: N/A;  . APPENDECTOMY  1963  . BACK SURGERY    . BILATERAL CARPAL TUNNEL RELEASE Bilateral   . CARDIAC CATHETERIZATION     "[facility]"  . CATARACT EXTRACTION W/ INTRAOCULAR LENS  IMPLANT,  BILATERAL Bilateral   . JOINT REPLACEMENT    . NASAL SINUS SURGERY  1989  . POSTERIOR LUMBAR FUSION     L4, L5  . TOTAL KNEE ARTHROPLASTY Left 2007  . TRANSURETHRAL RESECTION OF PROSTATE     bph  . ULNAR TUNNEL RELEASE Right     Social History   Social History  . Marital status: Married    Spouse name: N/A  . Number of children: 3  . Years of education: N/A   Occupational History  . Retired Other   Social History Main Topics  . Smoking status: Former Smoker    Packs/day: 1.00    Years: 55.00    Types: Cigarettes    Quit date:  04/29/2016  . Smokeless tobacco: Never Used  . Alcohol use 0.6 oz/week    1 Shots of liquor per week  . Drug use: No  . Sexual activity: Not Currently   Other Topics Concern  . Not on file   Social History Narrative  . No narrative on file    Family History  Problem Relation Age of Onset  . Prostate cancer Brother   . Cancer Father   . Arthritis Other   . Diabetes Other     ROS: no fevers or chills, productive cough, hemoptysis, dysphasia, odynophagia, melena, hematochezia, dysuria, hematuria, rash, seizure activity, orthopnea, PND, pedal edema, claudication. Remaining systems are negative.  Physical Exam: Well-developed obese in no acute distress.  Skin is warm and dry.  HEENT is normal.  Neck is supple.  Chest is clear to auscultation with normal expansion.  Cardiovascular exam is regular rate and rhythm.  Abdominal exam nontender or distended. No masses palpated. Extremities show trace edema. neuro grossly intact  ECG-Sinus rhythm, left axis deviation, no ST changes.  A/P  1 Coronary artery disease-continue aspirin and statin.  2 hyperlipidemia-continue statin.  3 hypertension-blood pressure controlled. Continue present medications.  4 SVT-continue verapamil. Symptoms are reasonably well controlled at present.  5 tobacco abuse-patient counseled on discontinuing.  6 edema-continue present dose of Lasix.  Kirk Ruths, MD

## 2016-07-03 ENCOUNTER — Ambulatory Visit (INDEPENDENT_AMBULATORY_CARE_PROVIDER_SITE_OTHER): Payer: Medicare Other | Admitting: Cardiology

## 2016-07-03 ENCOUNTER — Encounter: Payer: Self-pay | Admitting: Cardiology

## 2016-07-03 VITALS — BP 104/68 | HR 94 | Ht 68.0 in | Wt 247.0 lb

## 2016-07-03 DIAGNOSIS — E785 Hyperlipidemia, unspecified: Secondary | ICD-10-CM | POA: Diagnosis not present

## 2016-07-03 DIAGNOSIS — I2510 Atherosclerotic heart disease of native coronary artery without angina pectoris: Secondary | ICD-10-CM

## 2016-07-03 DIAGNOSIS — I471 Supraventricular tachycardia: Secondary | ICD-10-CM | POA: Diagnosis not present

## 2016-07-03 DIAGNOSIS — I10 Essential (primary) hypertension: Secondary | ICD-10-CM | POA: Diagnosis not present

## 2016-07-03 NOTE — Patient Instructions (Signed)
Medication Instructions:   NO CHANGES.  Follow-Up: Your physician wants you to follow-up in: 12 MONTHS WITH DR CRENSHAW. You will receive a reminder letter in the mail two months in advance. If you don't receive a letter, please call our office to schedule the follow-up appointment.   If you need a refill on your cardiac medications before your next appointment, please call your pharmacy.   

## 2016-07-10 NOTE — Progress Notes (Addendum)
Subjective:   Steve Charles is a 70 y.o. male who presents for Medicare Annual/Subsequent preventive examination.  HRA assessment completed during this visit with Steve Charles  The Patient was informed that the wellness visit is to identify future health risk and educate and initiate measures that can reduce risk for increased disease through the lifespan.    NO ROS; Medicare Wellness Visit Describes health as good, fair or great?  Good   Lives with spouse;  3 children; one here and one is [place] and one [place]  Mother in law helped with children   Social History   Social History  . Marital status: Married    Spouse name: N/A  . Number of children: 3  . Years of education: N/A   Occupational History  . Retired Other   Social History Main Topics  . Smoking status: Former Smoker    Packs/day: 1.00    Years: 55.00    Types: Cigarettes    Quit date: 04/29/2016  . Smokeless tobacco: Never Used  . Alcohol use 0.6 oz/week    1 Shots of liquor per week  . Drug use: No  . Sexual activity: Not Currently   Other Topics Concern  . Not on file   Social History Narrative  . No narrative on file   Quit smoking recently;  Has been 3 months; doing well    DM2- amaryl; metformin; last a1c was 7.0 05/2016  Invokana; doing well; under endo's care  OA risk for falls  Hyperlipidemia HDL 34; LDL 79; trig 79; chol 129  Surgical: cervical discectomy; 06/2016 C3 and C4  Had pain in hair and elbows hurt; MRI; no more disc in cervical area; woke up from surgery with no pain  Pain free now   TKR risk for falls and lack of exercise   Family Hx; Father had cancer; brother had prostate cancer;   Metabolic risk Controllable  risk for heart disease reviewed for goal setting: Includes; family hx; BP control <140/80; monitoring renal disease; obesity (BMI >30); sedentary lifestyle Educated regarding Metabolic syndrome / Excess weight around the waist;  Waist circumference for Men  >40  HDL < 50 BP > 130/85 Glucose > 100 Plan Lifestyle changes Dr. Kerry Fort stated he was in good shape Problem with HTN; chol  Medications reviewed for issues; very motivated patient and compliant  BMI: 37   Diet; he counting carbs;  Calories; around 2000;  Started off at 330lbs; and took 6 years to lose/ 2 weeks on steroids was a challenge  Is seeing dietician   Dental work: having new upper plate   Exercise;   Has been limited in exercise Was up to 6 miles prior to surgery Up to 2 miles at day;  Working his way back up  Go back to the gym;  walks at 4 am in the morning   Goal is 200 lbs;    Fall hx; no  States he wasn't paying attention and fell and hit calf and went to the wound center;  Neuropathy  Due to back issues  No vascular ulcers  Given education on "Fall Prevention in the Home" for more safety tips the patient can apply as appropriate.   Personal safety issues reviewed:  1.  for risk such as safe community/ 2.  smoke detector/ yes 3.  firearms safety if applicable  4. protection when in the sun; wears hat 5. driving safety for seniors or any recent accidents. No    Depression;  anxiety or mood issues assessed   Cognitive screen completed; MMSE documented or assessed for failures or issues with the AD8 screen below:   Ad8 score reviewed for issues;  Issues making decisions; no  Less interest in hobbies / activities" no  Repeats questions, stories; family complaining: NO  Trouble using ordinary gadgets; microwave; computer: no  Forgets the month or year: no  Mismanaging finances: no  Missing apt: no but does write them down  Daily problems with thinking of memory NO Ad8 score is 0  MMSE not appropriate unless AD8 score is > 2   Advanced Directive reviewed for completion or educated regarding [facility] form; the electing a health care agent and completing the Living Will.   Assessed for Preventive Heath Gaps and completion;  Health  Maintenance Due  Topic Date Due  . URINE MICROALBUMIN  08/11/2015  . FOOT EXAM  03/28/2016  . INFLUENZA VACCINE  06/03/2016   Note Urine Microalbumin; labs by dr. Chalmers Cater and are on file;  Bun/ Creatinine was normal. Bun creat ratio; good GFr very good  Dr. Chalmers Cater did not do a urine microalbumin with blood work   Dr. Chalmers Cater did foot exam July per the patient's report and is managing the blood sugar   Eye exam completed 01/2016  Has no diabetic retinopathy   Colonoscopy 11/2008 / repeat 11/2008 EKG 06/2016   Future due dates reviewed with the patient for accuracy.    Established and updated Risk reviewed and appropriate referral made or health recommendations as appropriate based on individual needs and choices;   Current Care Team reviewed and updated     Last office visit 06/16/2016 in to see Dr. Elease Hashimoto   Cardiac Risk Factors include: advanced age (>82men, >27 women);obesity (BMI >30kg/m2)     Objective:    Vitals: BP (!) 100/50   Pulse 99   Ht 5\' 8"  (1.727 m)   Wt 253 lb (114.8 kg)   SpO2 94%   BMI 38.47 kg/m   Body mass index is 38.47 kg/m.  Tobacco History  Smoking Status  . Former Smoker  . Packs/day: 1.00  . Years: 55.00  . Types: Cigarettes  . Quit date: 04/29/2016  Smokeless Tobacco  . Never Used     Counseling given: Yes   Past Medical History:  Diagnosis Date  . Arthritis    "entire spine; both of my hands" (06/04/2016)  . BPH (benign prostatic hypertrophy)   . CAD (coronary artery disease)   . Chronic lower back pain   . COPD (chronic obstructive pulmonary disease) ([place]) 05/01/2011   PFT 05/01/11>>FEV1 2.07(72%), FEV1% 65, TLC 6.79(111%), DLCO 69%, no BD  . DYSLIPIDEMIA 11/29/2009  . Dysrhythmia    SVT  . Exposure to TB 9 yrs ago   no active infection  . HYPERTENSION, ESSENTIAL 11/29/2009  . OSA on CPAP 03/26/2011   CPAP 9 cm H2O  . Osteoarthritis   . Pneumonia 1990s  . SVT (supraventricular tachycardia) ([place])   . Type II diabetes  mellitus ([place])   . URTICARIA 02/06/2010   Past Surgical History:  Procedure Laterality Date  . ANTERIOR CERVICAL DECOMP/DISCECTOMY FUSION  06/04/2016   C3-4 and C4-5 anterior cervical decompression and arthrodesis with structural allograft and anterior cervical plating  . ANTERIOR CERVICAL DECOMP/DISCECTOMY FUSION N/A 06/04/2016   Procedure: CERVICAL THREE-FOUR, CERVICAL FOUR-FIVE ANTERIOR CERVICAL DECOMPRESSION/DISCECTOMY FUSION;  Surgeon: Jovita Gamma, MD;  Location: [facility];  Service: Neurosurgery;  Laterality: N/A;  . APPENDECTOMY  1963  .  BACK SURGERY    . BILATERAL CARPAL TUNNEL RELEASE Bilateral   . CARDIAC CATHETERIZATION     "[place]"  . CATARACT EXTRACTION W/ INTRAOCULAR LENS  IMPLANT, BILATERAL Bilateral   . JOINT REPLACEMENT    . NASAL SINUS SURGERY  1989  . POSTERIOR LUMBAR FUSION     L4, L5  . TOTAL KNEE ARTHROPLASTY Left 2007  . TRANSURETHRAL RESECTION OF PROSTATE     bph  . ULNAR TUNNEL RELEASE Right    Family History  Problem Relation Age of Onset  . Prostate cancer Brother   . Cancer Father   . Arthritis Other   . Diabetes Other    History  Sexual Activity  . Sexual activity: Not Currently    Outpatient Encounter Prescriptions as of 07/11/2016  Medication Sig  . aspirin 81 MG tablet Take 81 mg by mouth every evening.   . Canagliflozin (INVOKANA) 300 MG TABS Take 1 tablet by mouth daily.  . CHROMIUM-CINNAMON PO Take 2,000 mg by mouth daily.  . Cyanocobalamin (VITAMIN B 12 PO) Take 1 tablet by mouth daily.  . furosemide (LASIX) 40 MG tablet Take 40 mg by mouth daily as needed (swelling).  . gabapentin (NEURONTIN) 300 MG capsule Take 300 mg by mouth 3 (three) times daily.  Marland Kitchen glimepiride (AMARYL) 1 MG tablet Take 1 mg by mouth 2 (two) times daily. Reported on 11/21/2015  . LANTUS SOLOSTAR 100 UNIT/ML Solostar Pen Inject 10 Units into the skin daily at 10 pm.   . metFORMIN (GLUCOPHAGE) 500 MG tablet Take 1,000 mg by mouth 2 (two) times daily with a meal.     . pravastatin (PRAVACHOL) 40 MG tablet TAKE ONE TABLET EVERY EVENING  . Turmeric 450 MG CAPS Take 450 mg by mouth daily.  . verapamil (CALAN-SR) 240 MG CR tablet TAKE ONE TABLET EACH DAY   No facility-administered encounter medications on file as of 07/11/2016.     Activities of Daily Living In your present state of health, do you have any difficulty performing the following activities: 07/11/2016 06/04/2016  Hearing? N -  Vision? N -  Difficulty concentrating or making decisions? N -  Walking or climbing stairs? N -  Dressing or bathing? N -  Doing errands, shopping? N N  Preparing Food and eating ? N -  Using the Toilet? N -  In the past six months, have you accidently leaked urine? N -  Do you have problems with loss of bowel control? N -  Managing your Medications? N -  Managing your Finances? N -  Housekeeping or managing your Housekeeping? N -  Some recent data might be hidden    Patient Care Team: [name], MD as PCP - General Lelon Perla, MD as Referring Physician (Cardiology) Jacelyn Pi, MD as Consulting Physician (Endocrinology)   Assessment:     Exercise Activities and Dietary recommendations Current Exercise Habits: Home exercise routine, Type of exercise: walking, Time (Minutes): > 60, Frequency (Times/Week): 6, Weekly Exercise (Minutes/Week): 0, Intensity: Moderate  Goals    . Weight (lb) < 200 lb (90.7 kg)          Will continue to see the dietician and see increase exercise as tolerated.      Fall Risk Fall Risk  07/11/2016 03/07/2016 12/12/2015 08/30/2015 03/29/2015  Falls in the past year? No No No No No   Depression Screen PHQ 2/9 Scores 07/11/2016 03/07/2016 12/12/2015 08/30/2015  PHQ - 2 Score 0 0 0 0  Cognitive Testing MMSE - Mini Mental State Exam 07/11/2016  Not completed: (No Data)    Immunization History  Administered Date(s) Administered  . Influenza Split 09/09/2011, 08/19/2012  . Influenza Whole 09/23/2010  . Influenza, High  Dose Seasonal PF 07/11/2016  . Influenza,inj,Quad PF,36+ Mos 08/12/2013, 08/24/2014, 08/01/2015  . Pneumococcal Conjugate-13 02/01/2014  . Pneumococcal Polysaccharide-23 09/23/2010  . Td 09/23/2010  . Tdap 04/24/2016   Screening Tests Health Maintenance  Topic Date Due  . URINE MICROALBUMIN  08/11/2015  . FOOT EXAM  03/28/2016  . INFLUENZA VACCINE  06/03/2016  . HEMOGLOBIN A1C  11/12/2016  . OPHTHALMOLOGY EXAM  01/23/2017  . COLONOSCOPY  11/03/2018  . TETANUS/TDAP  04/24/2026  . ZOSTAVAX  Addressed  . Hepatitis C Screening  Completed  . PNA vac Low Risk Adult  Completed      Plan:     Will take high does flu shot today  Will draw urine micro-albumin today as Dr. Chalmers Cater did not do this specifically in July. Is not on an ace/Arb  Deferred discussion of LDCT in lieu of smoking hx; will defer to pulmonology  AAA completed   Foot exam deferred to Dr. Chalmers Cater;  States he has to take his socks and shoes off at every visit.   During the course of the visit the patient was educated and counseled about the following appropriate screening and preventive services:   Vaccines to include Pneumoccal, Influenza, Hepatitis B, Td, Zostavax, HCV  Electrocardiogram  Cardiovascular Disease/ deferred to cardiology   Colorectal cancer screening/due 2020  Diabetes screening/ managed well at present  Prostate Cancer Screening/ deferred   Glaucoma screening/ neg and also neg for diabetic retinopathy per the patients report   Nutrition counseling; seeing dietician   Smoking cessation counseling/ recently quit; doing well   Patient Instructions (the written plan) was given to the patient.    Wynetta Fines, RN  07/11/2016   Agree with assessment and plan as above per Wynetta Fines, RN  [name] MD [facility]

## 2016-07-11 ENCOUNTER — Ambulatory Visit (INDEPENDENT_AMBULATORY_CARE_PROVIDER_SITE_OTHER): Payer: Medicare Other

## 2016-07-11 ENCOUNTER — Other Ambulatory Visit (HOSPITAL_COMMUNITY)
Admit: 2016-07-11 | Discharge: 2016-07-11 | Disposition: A | Discharge status: Home / Self Care | Payer: Medicare Other | Attending: Family Medicine | Admitting: Family Medicine

## 2016-07-11 VITALS — BP 100/50 | HR 99 | Ht 68.0 in | Wt 253.0 lb

## 2016-07-11 DIAGNOSIS — Z0000 Encounter for general adult medical examination without abnormal findings: Secondary | ICD-10-CM

## 2016-07-11 DIAGNOSIS — E119 Type 2 diabetes mellitus without complications: Secondary | ICD-10-CM

## 2016-07-11 DIAGNOSIS — Z23 Encounter for immunization: Secondary | ICD-10-CM | POA: Diagnosis not present

## 2016-07-11 NOTE — Patient Instructions (Addendum)
Steve Charles , Thank you for taking time to come for your Medicare Wellness Visit. I appreciate your ongoing commitment to your health goals. Please review the following plan we discussed and let me know if I can assist you in the future.   Will take high does flu shot today  Will draw urine micro-albumin today as Dr. Chalmers Cater did not do this specifically in July.    These are the goals we discussed: Goals    . Weight (lb) < 200 lb (90.7 kg)          Will continue to see the dietician and see increase exercise as tolerated.       This is a list of the screening recommended for you and due dates:  Health Maintenance  Topic Date Due  . Urine Protein Check  08/11/2015  . Complete foot exam   03/28/2016  . Flu Shot  06/03/2016  . Hemoglobin A1C  11/12/2016  . Eye exam for diabetics  01/23/2017  . Colon Cancer Screening  11/03/2018  . Tetanus Vaccine  04/24/2026  . Shingles Vaccine  Addressed  .  Hepatitis C: One time screening is recommended by Center for Disease Control  (CDC) for  adults born from [birth year] through 1966.   Completed  . Pneumonia vaccines  Completed       Fall Prevention in the Home  Falls can cause injuries. They can happen to people of all ages. There are many things you can do to make your home safe and to help prevent falls.  WHAT CAN I DO ON THE OUTSIDE OF MY HOME?  Regularly fix the edges of walkways and driveways and fix any cracks.  Remove anything that might make you trip as you walk through a door, such as a raised step or threshold.  Trim any bushes or trees on the path to your home.  Use bright outdoor lighting.  Clear any walking paths of anything that might make someone trip, such as rocks or tools.  Regularly check to see if handrails are loose or broken. Make sure that both sides of any steps have handrails.  Any raised decks and porches should have guardrails on the edges.  Have any leaves, snow, or ice cleared regularly.  Use sand or  salt on walking paths during winter.  Clean up any spills in your garage right away. This includes oil or grease spills. WHAT CAN I DO IN THE BATHROOM?   Use night lights.  Install grab bars by the toilet and in the tub and shower. Do not use towel bars as grab bars.  Use non-skid mats or decals in the tub or shower.  If you need to sit down in the shower, use a plastic, non-slip stool.  Keep the floor dry. Clean up any water that spills on the floor as soon as it happens.  Remove soap buildup in the tub or shower regularly.  Attach bath mats securely with double-sided non-slip rug tape.  Do not have throw rugs and other things on the floor that can make you trip. WHAT CAN I DO IN THE BEDROOM?  Use night lights.  Make sure that you have a light by your bed that is easy to reach.  Do not use any sheets or blankets that are too big for your bed. They should not hang down onto the floor.  Have a firm chair that has side arms. You can use this for support while you get dressed.  Do not  have throw rugs and other things on the floor that can make you trip. WHAT CAN I DO IN THE KITCHEN?  Clean up any spills right away.  Avoid walking on wet floors.  Keep items that you use a lot in easy-to-reach places.  If you need to reach something above you, use a strong step stool that has a grab bar.  Keep electrical cords out of the way.  Do not use floor polish or wax that makes floors slippery. If you must use wax, use non-skid floor wax.  Do not have throw rugs and other things on the floor that can make you trip. WHAT CAN I DO WITH MY STAIRS?  Do not leave any items on the stairs.  Make sure that there are handrails on both sides of the stairs and use them. Fix handrails that are broken or loose. Make sure that handrails are as long as the stairways.  Check any carpeting to make sure that it is firmly attached to the stairs. Fix any carpet that is loose or worn.  Avoid having  throw rugs at the top or bottom of the stairs. If you do have throw rugs, attach them to the floor with carpet tape.  Make sure that you have a light switch at the top of the stairs and the bottom of the stairs. If you do not have them, ask someone to add them for you. WHAT ELSE CAN I DO TO HELP PREVENT FALLS?  Wear shoes that:  Do not have high heels.  Have rubber bottoms.  Are comfortable and fit you well.  Are closed at the toe. Do not wear sandals.  If you use a stepladder:  Make sure that it is fully opened. Do not climb a closed stepladder.  Make sure that both sides of the stepladder are locked into place.  Ask someone to hold it for you, if possible.  Clearly mark and make sure that you can see:  Any grab bars or handrails.  First and last steps.  Where the edge of each step is.  Use tools that help you move around (mobility aids) if they are needed. These include:  Canes.  Walkers.  Scooters.  Crutches.  Turn on the lights when you go into a dark area. Replace any light bulbs as soon as they burn out.  Set up your furniture so you have a clear path. Avoid moving your furniture around.  If any of your floors are uneven, fix them.  If there are any pets around you, be aware of where they are.  Review your medicines with your doctor. Some medicines can make you feel dizzy. This can increase your chance of falling. Ask your doctor what other things that you can do to help prevent falls.   This information is not intended to replace advice given to you by your health care provider. Make sure you discuss any questions you have with your health care provider.   Document Released: 08/16/2009 Document Revised: 03/06/2015 Document Reviewed: 11/24/2014 Elsevier Interactive Patient Education 2016 [place] Maintenance, Male A healthy lifestyle and preventative care can promote health and wellness.  Maintain regular health, dental, and eye  exams.  Eat a healthy diet. Foods like vegetables, fruits, whole grains, low-fat dairy products, and lean protein foods contain the nutrients you need and are low in calories. Decrease your intake of foods high in solid fats, added sugars, and salt. Get information about a proper diet from your health  care provider, if necessary.  Regular physical exercise is one of the most important things you can do for your health. Most adults should get at least 150 minutes of moderate-intensity exercise (any activity that increases your heart rate and causes you to sweat) each week. In addition, most adults need muscle-strengthening exercises on 2 or more days a week.   Maintain a healthy weight. The body mass index (BMI) is a screening tool to identify possible weight problems. It provides an estimate of body fat based on height and weight. Your health care provider can find your BMI and can help you achieve or maintain a healthy weight. For males 20 years and older:  A BMI below 18.5 is considered underweight.  A BMI of 18.5 to 24.9 is normal.  A BMI of 25 to 29.9 is considered overweight.  A BMI of 30 and above is considered obese.  Maintain normal blood lipids and cholesterol by exercising and minimizing your intake of saturated fat. Eat a balanced diet with plenty of fruits and vegetables. Blood tests for lipids and cholesterol should begin at age 56 and be repeated every 5 years. If your lipid or cholesterol levels are high, you are over age 63, or you are at high risk for heart disease, you may need your cholesterol levels checked more frequently.Ongoing high lipid and cholesterol levels should be treated with medicines if diet and exercise are not working.  If you smoke, find out from your health care provider how to quit. If you do not use tobacco, do not start.  Lung cancer screening is recommended for adults aged 28-80 years who are at high risk for developing lung cancer because of a history  of smoking. A yearly low-dose CT scan of the lungs is recommended for people who have at least a 30-pack-year history of smoking and are current smokers or have quit within the past 15 years. A pack year of smoking is smoking an average of 1 pack of cigarettes a day for 1 year (for example, a 30-pack-year history of smoking could mean smoking 1 pack a day for 30 years or 2 packs a day for 15 years). Yearly screening should continue until the smoker has stopped smoking for at least 15 years. Yearly screening should be stopped for people who develop a health problem that would prevent them from having lung cancer treatment.  If you choose to drink alcohol, do not have more than 2 drinks per day. One drink is considered to be 12 oz (360 mL) of beer, 5 oz (150 mL) of wine, or 1.5 oz (45 mL) of liquor.  Avoid the use of street drugs. Do not share needles with anyone. Ask for help if you need support or instructions about stopping the use of drugs.  High blood pressure causes heart disease and increases the risk of stroke. High blood pressure is more likely to develop in:  People who have blood pressure in the end of the normal range (100-139/85-89 mm Hg).  People who are overweight or obese.  People who are African American.  If you are 66-54 years of age, have your blood pressure checked every 3-5 years. If you are 62 years of age or older, have your blood pressure checked every year. You should have your blood pressure measured twice--once when you are at a hospital or clinic, and once when you are not at a hospital or clinic. Record the average of the two measurements. To check your blood pressure when you  are not at a hospital or clinic, you can use:  An automated blood pressure machine at a pharmacy.  A home blood pressure monitor.  If you are 74-93 years old, ask your health care provider if you should take aspirin to prevent heart disease.  Diabetes screening involves taking a blood sample to  check your fasting blood sugar level. This should be done once every 3 years after age 16 if you are at a normal weight and without risk factors for diabetes. Testing should be considered at a younger age or be carried out more frequently if you are overweight and have at least 1 risk factor for diabetes.  Colorectal cancer can be detected and often prevented. Most routine colorectal cancer screening begins at the age of 63 and continues through age 9. However, your health care provider may recommend screening at an earlier age if you have risk factors for colon cancer. On a yearly basis, your health care provider may provide home test kits to check for hidden blood in the stool. A small camera at the end of a tube may be used to directly examine the colon (sigmoidoscopy or colonoscopy) to detect the earliest forms of colorectal cancer. Talk to your health care provider about this at age 56 when routine screening begins. A direct exam of the colon should be repeated every 5-10 years through age 61, unless early forms of precancerous polyps or small growths are found.  People who are at an increased risk for hepatitis B should be screened for this virus. You are considered at high risk for hepatitis B if:  You were born in a country where hepatitis B occurs often. Talk with your health care provider about which countries are considered high risk.  Your parents were born in a high-risk country and you have not received a shot to protect against hepatitis B (hepatitis B vaccine).  You have HIV or AIDS.  You use needles to inject street drugs.  You live with, or have sex with, someone who has hepatitis B.  You are a man who has sex with other men (MSM).  You get hemodialysis treatment.  You take certain medicines for conditions like cancer, organ transplantation, and autoimmune conditions.  Hepatitis C blood testing is recommended for all people born from [birth year] through 1965 and any individual with  known risk factors for hepatitis C.  Healthy men should no longer receive prostate-specific antigen (PSA) blood tests as part of routine cancer screening. Talk to your health care provider about prostate cancer screening.  Testicular cancer screening is not recommended for adolescents or adult males who have no symptoms. Screening includes self-exam, a health care provider exam, and other screening tests. Consult with your health care provider about any symptoms you have or any concerns you have about testicular cancer.  Practice safe sex. Use condoms and avoid high-risk sexual practices to reduce the spread of sexually transmitted infections (STIs).  You should be screened for STIs, including gonorrhea and chlamydia if:  You are sexually active and are younger than 24 years.  You are older than 24 years, and your health care provider tells you that you are at risk for this type of infection.  Your sexual activity has changed since you were last screened, and you are at an increased risk for chlamydia or gonorrhea. Ask your health care provider if you are at risk.  If you are at risk of being infected with HIV, it is recommended that you take a  prescription medicine daily to prevent HIV infection. This is called pre-exposure prophylaxis (PrEP). You are considered at risk if:  You are a man who has sex with other men (MSM).  You are a heterosexual man who is sexually active with multiple partners.  You take drugs by injection.  You are sexually active with a partner who has HIV.  Talk with your health care provider about whether you are at high risk of being infected with HIV. If you choose to begin PrEP, you should first be tested for HIV. You should then be tested every 3 months for as long as you are taking PrEP.  Use sunscreen. Apply sunscreen liberally and repeatedly throughout the day. You should seek shade when your shadow is shorter than you. Protect yourself by wearing long  sleeves, pants, a wide-brimmed hat, and sunglasses year round whenever you are outdoors.  Tell your health care provider of new moles or changes in moles, especially if there is a change in shape or color. Also, tell your health care provider if a mole is larger than the size of a pencil eraser.  A one-time screening for abdominal aortic aneurysm (AAA) and surgical repair of large AAAs by ultrasound is recommended for men aged 71-75 years who are current or former smokers.  Stay current with your vaccines (immunizations).   This information is not intended to replace advice given to you by your health care provider. Make sure you discuss any questions you have with your health care provider.   Document Released: 04/17/2008 Document Revised: 11/10/2014 Document Reviewed: 03/17/2011 Elsevier Interactive Patient Education [employer].

## 2016-07-12 LAB — MICROALBUMIN, URINE: MICROALB UR: 5.2 ug/mL — AB

## 2016-07-14 ENCOUNTER — Ambulatory Visit: Payer: Medicare Other | Admitting: Cardiology

## 2016-07-15 ENCOUNTER — Telehealth: Payer: Self-pay | Admitting: *Deleted

## 2016-07-15 NOTE — Telephone Encounter (Signed)
Pt is needing clearance for C/3-4, C/4-5 anterior cervical fusion, Will forward for dr Stanford Breed review

## 2016-07-15 NOTE — Telephone Encounter (Signed)
Will forward this note to the number provided. 

## 2016-07-15 NOTE — Telephone Encounter (Signed)
Ok for surgery Brian Crenshaw  

## 2016-07-23 ENCOUNTER — Ambulatory Visit: Payer: Medicare Other | Admitting: Cardiology

## 2016-07-28 DIAGNOSIS — Z23 Encounter for immunization: Secondary | ICD-10-CM | POA: Diagnosis not present

## 2016-07-28 DIAGNOSIS — J449 Chronic obstructive pulmonary disease, unspecified: Secondary | ICD-10-CM | POA: Diagnosis not present

## 2016-07-28 DIAGNOSIS — Z122 Encounter for screening for malignant neoplasm of respiratory organs: Secondary | ICD-10-CM | POA: Diagnosis not present

## 2016-07-28 DIAGNOSIS — S81802D Unspecified open wound, left lower leg, subsequent encounter: Secondary | ICD-10-CM | POA: Diagnosis not present

## 2016-07-28 DIAGNOSIS — I10 Essential (primary) hypertension: Secondary | ICD-10-CM | POA: Diagnosis not present

## 2016-07-28 DIAGNOSIS — R609 Edema, unspecified: Secondary | ICD-10-CM | POA: Diagnosis not present

## 2016-07-28 DIAGNOSIS — G4733 Obstructive sleep apnea (adult) (pediatric): Secondary | ICD-10-CM | POA: Diagnosis not present

## 2016-07-28 DIAGNOSIS — Z125 Encounter for screening for malignant neoplasm of prostate: Secondary | ICD-10-CM | POA: Diagnosis not present

## 2016-08-03 IMAGING — CR DG CERVICAL SPINE 2 OR 3 VIEWS
2 series · 2 of 2 positions shown · non-contrast
Comparison: 05/13/2016

CLINICAL DATA: Cervical fusion at [DATE] and 4 5

EXAM:
CERVICAL SPINE - 2-3 VIEW

[AP (1 of 2)]
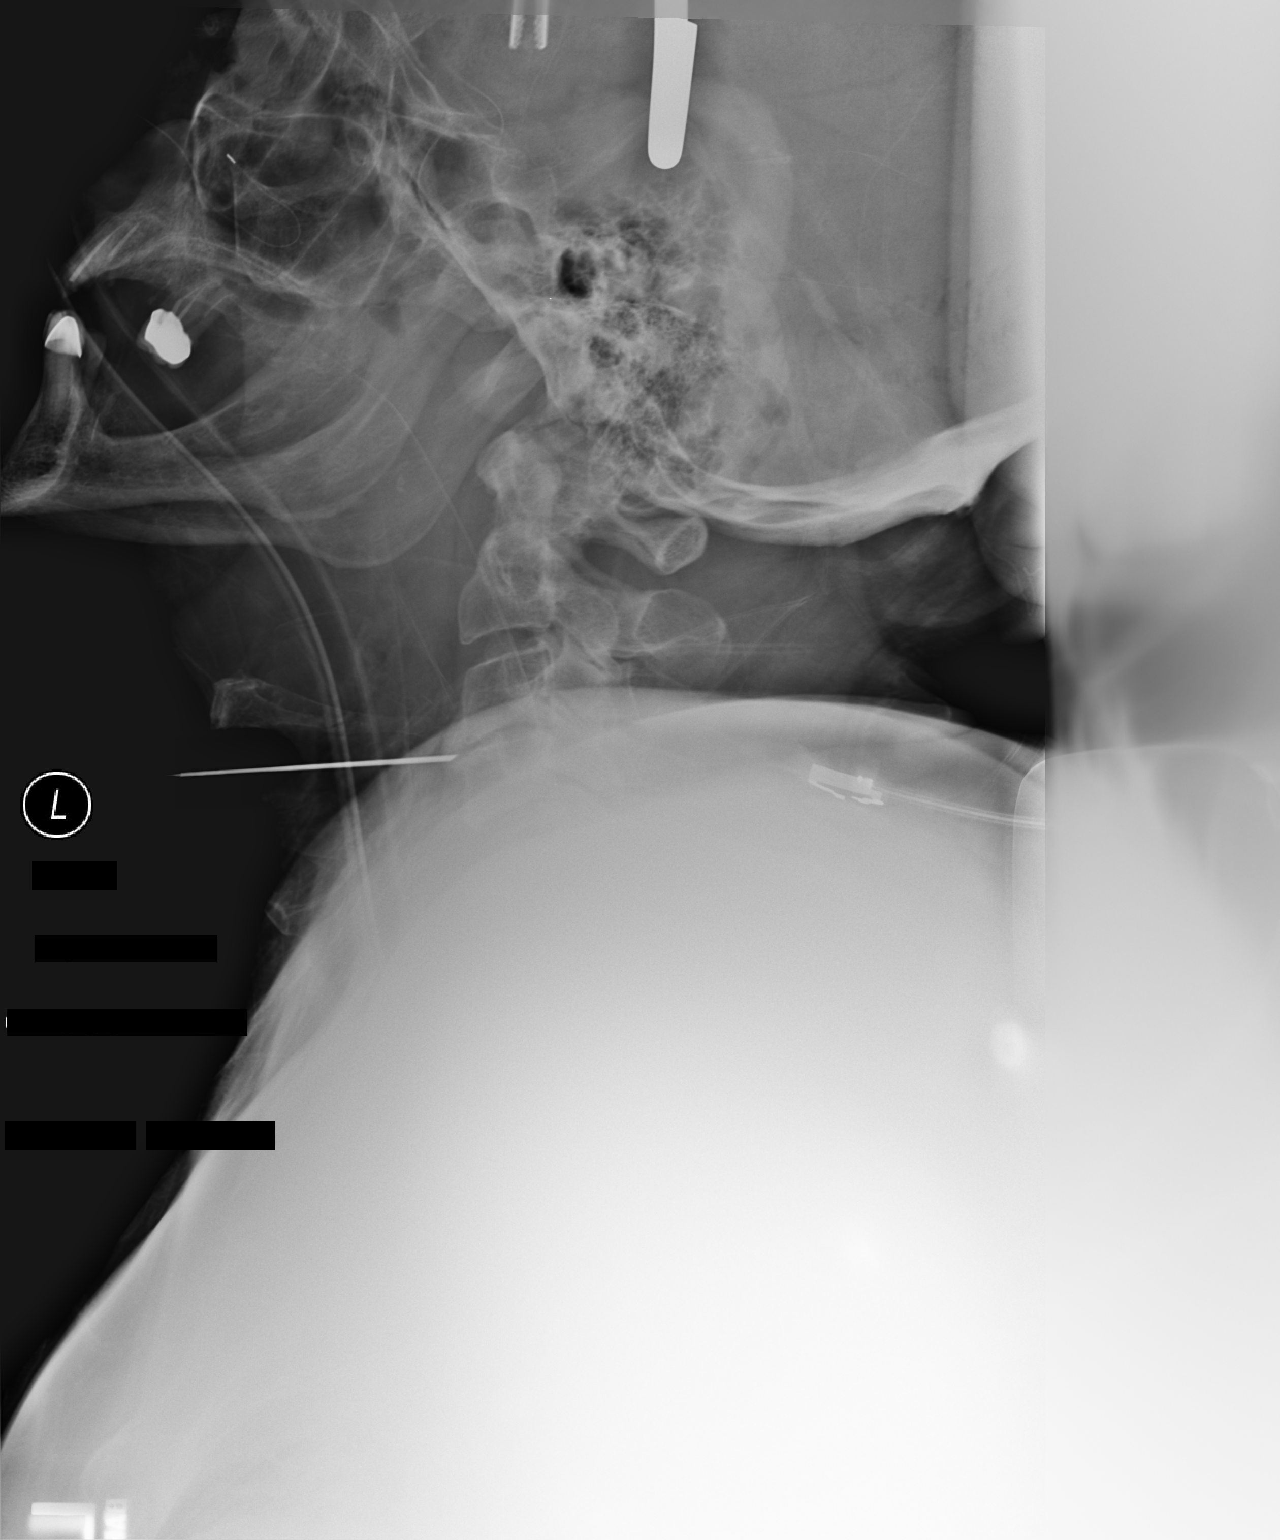

[AP (2 of 2)]
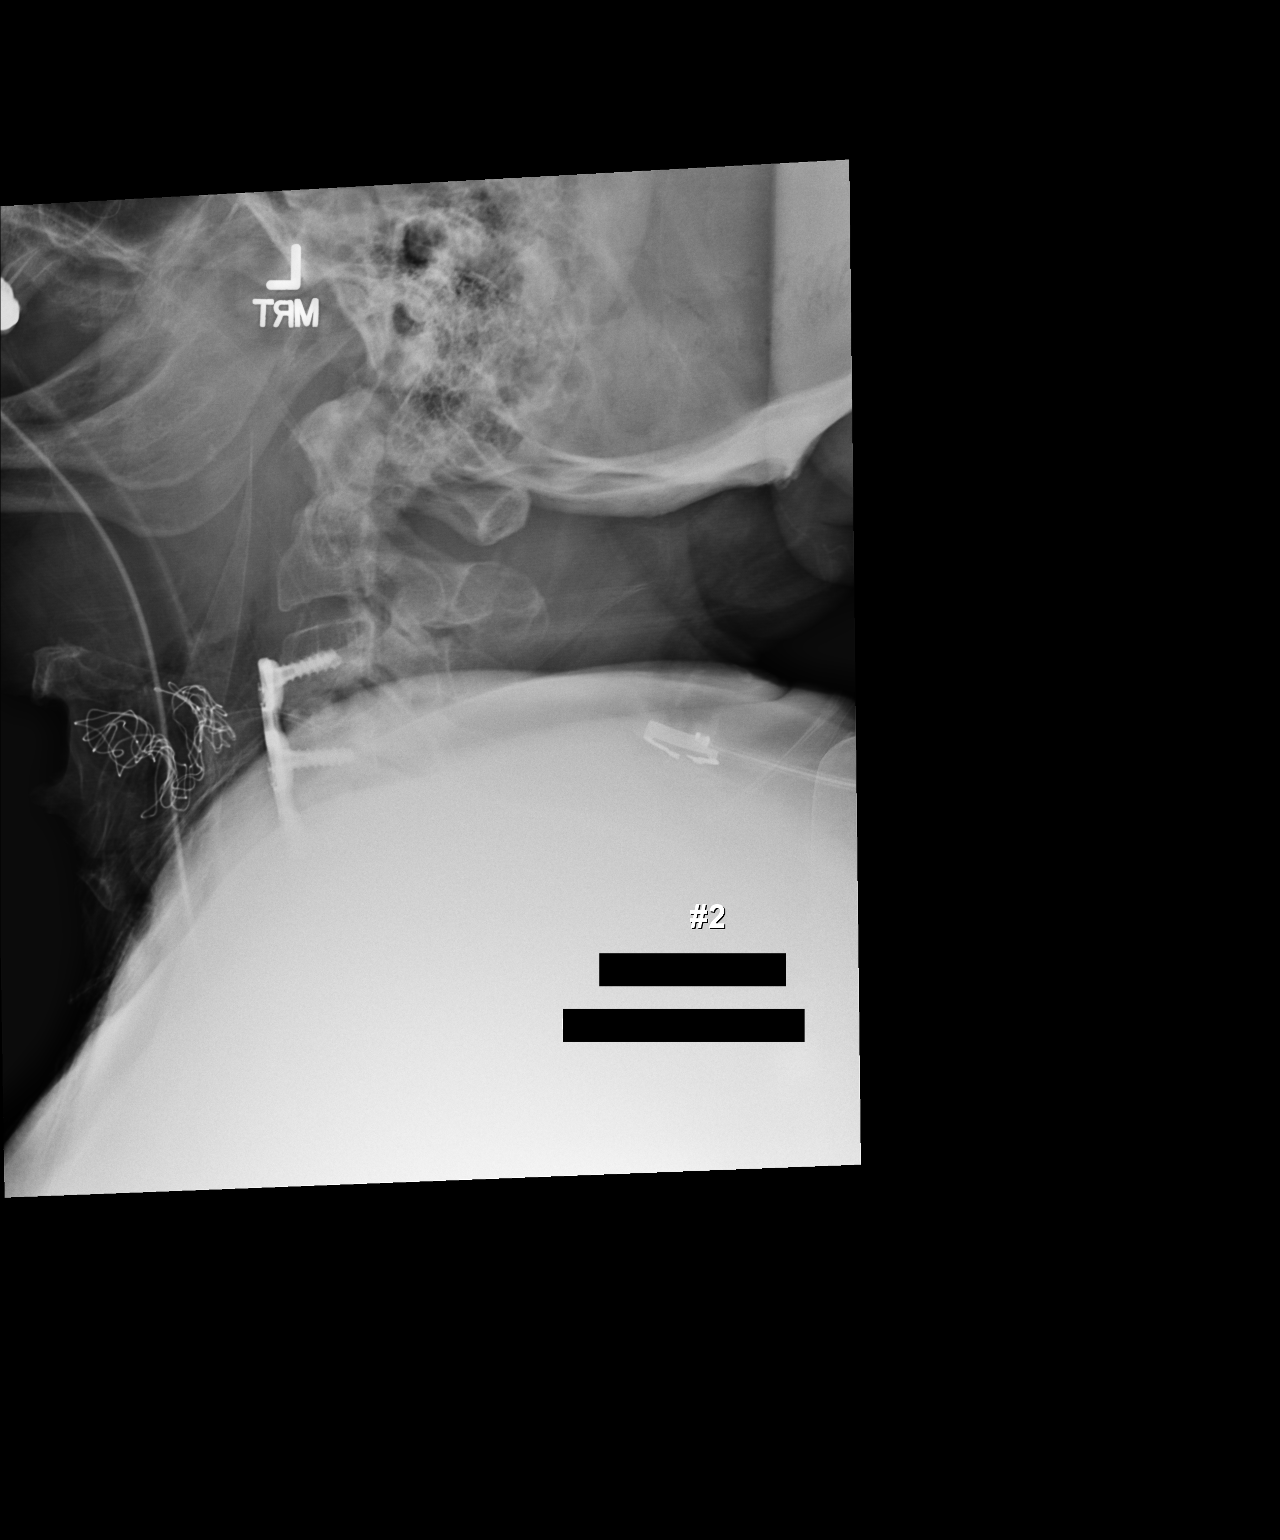

[2 of 2 positions shown; findings below may reference images not displayed]

FINDINGS: Lateral view of the cervical spine was obtained intraoperatively at
7414 hours and reveals a needle at the anterior aspect of the C3-4
interspace. The second image was obtained following completion which
demonstrates fusion at C3-4 and C4-5 with anterior fixation.
Radiopaque sponge is noted in the surgical bed.
IMPRESSION: Cervical fusion at C3-4 and C4-5.

## 2016-08-15 DIAGNOSIS — M7581 Other shoulder lesions, right shoulder: Secondary | ICD-10-CM | POA: Diagnosis not present

## 2016-08-19 DIAGNOSIS — R269 Unspecified abnormalities of gait and mobility: Secondary | ICD-10-CM | POA: Diagnosis not present

## 2016-08-19 DIAGNOSIS — M5136 Other intervertebral disc degeneration, lumbar region: Secondary | ICD-10-CM | POA: Diagnosis not present

## 2016-08-19 DIAGNOSIS — R2689 Other abnormalities of gait and mobility: Secondary | ICD-10-CM | POA: Diagnosis not present

## 2016-08-19 DIAGNOSIS — R238 Other skin changes: Secondary | ICD-10-CM | POA: Diagnosis not present

## 2016-08-19 DIAGNOSIS — M549 Dorsalgia, unspecified: Secondary | ICD-10-CM | POA: Diagnosis not present

## 2016-08-19 DIAGNOSIS — R42 Dizziness and giddiness: Secondary | ICD-10-CM | POA: Diagnosis not present

## 2016-08-19 DIAGNOSIS — F17210 Nicotine dependence, cigarettes, uncomplicated: Secondary | ICD-10-CM | POA: Diagnosis not present

## 2016-08-19 DIAGNOSIS — H539 Unspecified visual disturbance: Secondary | ICD-10-CM | POA: Diagnosis not present

## 2016-08-19 DIAGNOSIS — M1990 Unspecified osteoarthritis, unspecified site: Secondary | ICD-10-CM | POA: Diagnosis not present

## 2016-08-19 DIAGNOSIS — E1140 Type 2 diabetes mellitus with diabetic neuropathy, unspecified: Secondary | ICD-10-CM | POA: Diagnosis not present

## 2016-08-19 DIAGNOSIS — M5030 Other cervical disc degeneration, unspecified cervical region: Secondary | ICD-10-CM | POA: Diagnosis not present

## 2016-08-21 DIAGNOSIS — Z85118 Personal history of other malignant neoplasm of bronchus and lung: Secondary | ICD-10-CM | POA: Diagnosis not present

## 2016-08-21 DIAGNOSIS — Z122 Encounter for screening for malignant neoplasm of respiratory organs: Secondary | ICD-10-CM | POA: Diagnosis not present

## 2016-08-21 DIAGNOSIS — R918 Other nonspecific abnormal finding of lung field: Secondary | ICD-10-CM | POA: Diagnosis not present

## 2016-08-21 DIAGNOSIS — I2510 Atherosclerotic heart disease of native coronary artery without angina pectoris: Secondary | ICD-10-CM | POA: Diagnosis not present

## 2016-08-21 DIAGNOSIS — F17210 Nicotine dependence, cigarettes, uncomplicated: Secondary | ICD-10-CM | POA: Diagnosis not present

## 2016-08-29 DIAGNOSIS — Z981 Arthrodesis status: Secondary | ICD-10-CM | POA: Diagnosis not present

## 2016-08-29 DIAGNOSIS — M5030 Other cervical disc degeneration, unspecified cervical region: Secondary | ICD-10-CM | POA: Diagnosis not present

## 2016-08-29 DIAGNOSIS — M4722 Other spondylosis with radiculopathy, cervical region: Secondary | ICD-10-CM | POA: Diagnosis not present

## 2016-08-29 DIAGNOSIS — Z6837 Body mass index (BMI) 37.0-37.9, adult: Secondary | ICD-10-CM | POA: Diagnosis not present

## 2016-08-31 DIAGNOSIS — S61209A Unspecified open wound of unspecified finger without damage to nail, initial encounter: Secondary | ICD-10-CM | POA: Diagnosis not present

## 2016-09-05 ENCOUNTER — Ambulatory Visit: Payer: Medicare Other | Admitting: *Deleted

## 2016-09-17 ENCOUNTER — Other Ambulatory Visit: Payer: Self-pay | Admitting: Internal Medicine

## 2016-09-18 DIAGNOSIS — D751 Secondary polycythemia: Secondary | ICD-10-CM | POA: Diagnosis not present

## 2016-09-18 DIAGNOSIS — I10 Essential (primary) hypertension: Secondary | ICD-10-CM | POA: Diagnosis not present

## 2016-09-18 DIAGNOSIS — E1165 Type 2 diabetes mellitus with hyperglycemia: Secondary | ICD-10-CM | POA: Diagnosis not present

## 2016-09-18 DIAGNOSIS — E669 Obesity, unspecified: Secondary | ICD-10-CM | POA: Diagnosis not present

## 2016-09-18 DIAGNOSIS — R609 Edema, unspecified: Secondary | ICD-10-CM | POA: Diagnosis not present

## 2016-09-18 NOTE — Telephone Encounter (Signed)
This is Dr. Crenshaw pt 

## 2016-09-18 NOTE — Telephone Encounter (Signed)
Rx(s) sent to pharmacy electronically.  

## 2016-09-19 ENCOUNTER — Encounter: Payer: Medicare Other | Attending: Endocrinology | Admitting: *Deleted

## 2016-09-19 DIAGNOSIS — Z713 Dietary counseling and surveillance: Secondary | ICD-10-CM | POA: Diagnosis not present

## 2016-09-19 DIAGNOSIS — E119 Type 2 diabetes mellitus without complications: Secondary | ICD-10-CM | POA: Diagnosis not present

## 2016-09-19 DIAGNOSIS — E118 Type 2 diabetes mellitus with unspecified complications: Secondary | ICD-10-CM

## 2016-09-19 NOTE — Patient Instructions (Signed)
Plan:  Continue to aim for 3 Carb Choices per meal (45 grams) +/- 1 either way  Continue to aim for 0-2 Carbs per snack if hungry  Continue reading food labels for Total Carbohydrate and Fat Grams of foods Continue with your activity level daily as tolerated Continue checking BG at alternate times per day on occasion Continue taking Lantus at night as directed by your MD     

## 2016-09-19 NOTE — Progress Notes (Signed)
Medical Nutrition Therapy: 09/18/2016  Appt start time:  0730    end time:  0800  Patient states his last A1c was 6.9 % but states his MD explained it is actually 5.9 due to him having an elevated Hgb levels  Assessment:  Primary concerns today: patient here for follow up diabetes education. He is pleased with continued weight loss and improving A1c. He walks 4000 steps per day, limited by neurologist at this time.   TANITA  BODY COMP RESULTS on 07/21/13  Weight 255.5 lb   BMI (kg/m^2) 38.8   Fat Mass (lbs) 97.5 lb   Fat Free Mass (lbs) 158.0 lb   Total Body Water (lbs) 115.5 lb   TANITA  BODY COMP RESULTS on 10/21/13  Weight 257.5 lb   BMI (kg/m^2) 39.2 lb   Fat Mass (lbs) 97.5 lb   Fat Free Mass (lbs) 160.0 lb   Total Body Water (lbs) 117.0 lb   TANITA  BODY COMP RESULTS on 01/20/14  Weight 248.5 lb   BMI (kg/m^2) 37.8   Fat Mass (lbs) 96.0 lb   Fat Free Mass (lbs) 152.5 lb   Total Body Water (lbs) 111.5 lb   TANITA  BODY COMP RESULTS  Weight 250.0 lb   BMI (kg/m^2) 38.0   Fat Mass (lbs) 100.0 lb   Fat Free Mass (lbs) 150.0 lb   Total Body Water (lbs) 110.0 lb   TANITA  BODY COMP RESULTS  07/27/2014 Weight 249.0 lb   BMI (kg/m^2) 37.9   Fat Mass (lbs) 103.5   Fat Free Mass (lbs) 145.5   Total Body Water (lbs) 106.5 lb   TANITA  BODY COMP RESULTS 10/12/2014  Weight 251 lb   BMI (kg/m^2) 38.2   Fat Mass (lbs) 92.0 lb   Fat Free Mass (lbs) 159.0 lb   Total Body Water (lbs) 116.5 lb   TANITA  BODY COMP RESULTS 12/07/2014   Weight 258 lb   BMI (kg/m^2) 39.2   Fat Mass (lbs) 86.5 lb   Fat Free Mass (lbs) 171.5 lb   Total Body Water (lbs) 125.5 lb    TANITA  BODY COMP RESULTS 05/10/15  Weight (lbs) 241.0 lb   BMI (kg/m^2) 36.6   Fat Mass (lbs) 93.5   Fat Free Mass (lbs) 147.5   Total Body Water (lbs) 108.0   TANITA  BODY COMP RESULTS 08/30/15  Weight Lbs) 235.0 lb   BMI (kg/m^2) 35.7   Fat Mass (lbs) 84.5   Fat Free Mass (lbs) 150.5   Total Body Water (lbs)  110.0   TANITA  BODY COMP RESULTS 12/06/15  Weight (lbs) 237.5   BMI (kg/m^2) 36.1   Fat Mass (lbs) 89.0   Fat Free Mass (lbs) 148.5   Total Body Water (lbs) 108.5   TANITA  BODY COMP RESULTS 03/07/16  Weight (lbs) 252.5 lb   BMI (kg/m^2) 38.4   Fat Mass (lbs) 95.5   Fat Free Mass (lbs) 157.0   Total Body Water (lbs) 115.0   TANITA  BODY COMP RESULTS 06/13/2016  Weight (lbs) 250.0   BMI (kg/m^2) 38.0   Fat Mass (lbs) 93.0   Fat Free Mass (lbs) 157.0   Total Body Water (lbs) 115.0   TANITA  BODY COMP RESULTS 09/18/2016  Weight (lbs) 242.0   BMI (kg/m^2) 36.8   Fat Mass (lbs) 102   Fat Free Mass (lbs) 139   Total Body Water (lbs) 106    Weigh t loss of 8 pounds noted   MEDICATIONS:  see list, Diabetes medications are now Invokana, Amaryl, Metformin and Lantus insulin Patient states he was directed by his MD yesterday to start weaning off of Lantus by decreasing the dose by 1 unit every 2 weeks as tolerated   Usual physical activity: increasing to normal levels gradually    Intervention: Acknowledged his continued success with controlling his BG effectively and continuing to work on weight loss  Plan:  Continue to aim for 3 Carb Choices per meal (45 grams) +/- 1 either way  Continue to aim for 0-2 Carbs per snack if hungry  Continue reading food labels for Total Carbohydrate and Fat Grams of foods Continue with your activity level daily as tolerated Continue checking BG at alternate times per day on occasion Continue taking Lantus at night as directed by your MD    Handouts given during visit include: [place] Scale results handout A1c handout   Monitoring/Evaluation:  Dietary intake, exercise, reading food labels, and body weight in 3 months.

## 2016-10-02 ENCOUNTER — Other Ambulatory Visit: Payer: Self-pay | Admitting: Cardiology

## 2016-11-24 ENCOUNTER — Ambulatory Visit (INDEPENDENT_AMBULATORY_CARE_PROVIDER_SITE_OTHER): Payer: Medicare Other | Admitting: Family Medicine

## 2016-11-24 ENCOUNTER — Encounter: Payer: Self-pay | Admitting: Family Medicine

## 2016-11-24 VITALS — BP 130/76 | HR 99 | Ht 68.0 in | Wt 250.0 lb

## 2016-11-24 DIAGNOSIS — S60416A Abrasion of right little finger, initial encounter: Secondary | ICD-10-CM

## 2016-11-24 MED ORDER — CEPHALEXIN 500 MG PO CAPS: 500.0000 mg | ORAL_CAPSULE | Freq: Four times a day (QID) | ORAL | 0 refills | Status: DC

## 2016-11-24 NOTE — Progress Notes (Signed)
Pre visit review using our clinic review tool, if applicable. No additional management support is needed unless otherwise documented below in the visit note. 

## 2016-11-24 NOTE — Patient Instructions (Signed)
Try some warm salt water soaks Start the Keflex if you note any increased redness, warmth, swelling or fever.

## 2016-11-24 NOTE — Progress Notes (Signed)
Subjective:     Patient ID: Steve Charles, male   DOB: [birth year], 71 y.o.   MRN: [id number]  HPI Patient seen with possible early cellulitis changes right fifth finger. He has type 2 diabetes. On 11/14/16 he was going up some stairs and slipped and went to grab handrail and had abrasion right fifth finger just proximal to the nail. This bled some. This seemed to be doing well till couple days ago in no some mild redness and slight discomfort. No purulent drainage. No fevers or chills.  Past Medical History:  Diagnosis Date  . Arthritis    "entire spine; both of my hands" (06/04/2016)  . BPH (benign prostatic hypertrophy)   . CAD (coronary artery disease)   . Chronic lower back pain   . COPD (chronic obstructive pulmonary disease) ([place]) 05/01/2011   PFT 05/01/11>>FEV1 2.07(72%), FEV1% 65, TLC 6.79(111%), DLCO 69%, no BD  . DYSLIPIDEMIA 11/29/2009  . Dysrhythmia    SVT  . Exposure to TB 9 yrs ago   no active infection  . HYPERTENSION, ESSENTIAL 11/29/2009  . OSA on CPAP 03/26/2011   CPAP 9 cm H2O  . Osteoarthritis   . Pneumonia 1990s  . SVT (supraventricular tachycardia) ([place])   . Type II diabetes mellitus ([place])   . URTICARIA 02/06/2010   Past Surgical History:  Procedure Laterality Date  . ANTERIOR CERVICAL DECOMP/DISCECTOMY FUSION  06/04/2016   C3-4 and C4-5 anterior cervical decompression and arthrodesis with structural allograft and anterior cervical plating  . ANTERIOR CERVICAL DECOMP/DISCECTOMY FUSION N/A 06/04/2016   Procedure: CERVICAL THREE-FOUR, CERVICAL FOUR-FIVE ANTERIOR CERVICAL DECOMPRESSION/DISCECTOMY FUSION;  Surgeon: Jovita Gamma, MD;  Location: [facility];  Service: Neurosurgery;  Laterality: N/A;  . APPENDECTOMY  1963  . BACK SURGERY    . BILATERAL CARPAL TUNNEL RELEASE Bilateral   . CARDIAC CATHETERIZATION     "Crenshaw"  . CATARACT EXTRACTION W/ INTRAOCULAR LENS  IMPLANT, BILATERAL Bilateral   . JOINT REPLACEMENT    . NASAL SINUS SURGERY  1989  . POSTERIOR  LUMBAR FUSION     L4, L5  . TOTAL KNEE ARTHROPLASTY Left 2007  . TRANSURETHRAL RESECTION OF PROSTATE     bph  . ULNAR TUNNEL RELEASE Right     reports that he quit smoking about 6 months ago. His smoking use included Cigarettes. He has a 55.00 pack-year smoking history. He has never used smokeless tobacco. He reports that he drinks about 0.6 oz of alcohol per week . He reports that he does not use drugs. family history includes Arthritis in his other; Cancer in his father; Diabetes in his other; Prostate cancer in his brother. Allergies  Allergen Reactions  . Codeine Sulfate Hives  . Morphine And Related Hives    Oral morphine/ hives   . Propoxyphene N-Acetaminophen Hives  . Ramipril Hives  . Shellfish Allergy Hives     Review of Systems  Constitutional: Negative for chills and fever.       Objective:   Physical Exam  Constitutional: He appears well-developed and well-nourished.  Cardiovascular: Normal rate and regular rhythm.   Pulmonary/Chest: Effort normal and breath sounds normal. No respiratory distress. He has no wheezes. He has no rales.  Neurological: He exhibits normal muscle tone.  Skin:     Patient small abrasion right fifth digit dorsally at the base of the nail. He has some very minimal surrounding erythema but no fluctuance. No purulence. Not warm to touch. Minimally tender       Assessment:  Small abrasion right fifth finger dorsally. Possible very early cellulitis changes. No evidence for abscess    Plan:     -Warm saltwater soaks 2-3 times daily -Start Keflex 500 mg 4 times a day for 7 days if he has any progressive erythema, warmth, or tenderness  [name] MD [facility]

## 2016-12-26 DIAGNOSIS — M5030 Other cervical disc degeneration, unspecified cervical region: Secondary | ICD-10-CM | POA: Diagnosis not present

## 2016-12-26 DIAGNOSIS — Z981 Arthrodesis status: Secondary | ICD-10-CM | POA: Diagnosis not present

## 2016-12-26 DIAGNOSIS — M4722 Other spondylosis with radiculopathy, cervical region: Secondary | ICD-10-CM | POA: Diagnosis not present

## 2016-12-30 ENCOUNTER — Other Ambulatory Visit: Payer: Self-pay | Admitting: Neurosurgery

## 2016-12-30 DIAGNOSIS — M48062 Spinal stenosis, lumbar region with neurogenic claudication: Secondary | ICD-10-CM

## 2016-12-31 ENCOUNTER — Ambulatory Visit
Admission: RE | Admit: 2016-12-31 | Discharge: 2016-12-31 | Disposition: A | Discharge status: Home / Self Care | Payer: Medicare Other | Source: Ambulatory Visit | Attending: Neurosurgery | Admitting: Neurosurgery

## 2016-12-31 DIAGNOSIS — M48062 Spinal stenosis, lumbar region with neurogenic claudication: Secondary | ICD-10-CM

## 2016-12-31 DIAGNOSIS — M47817 Spondylosis without myelopathy or radiculopathy, lumbosacral region: Secondary | ICD-10-CM | POA: Diagnosis not present

## 2016-12-31 MED ORDER — METHYLPREDNISOLONE ACETATE 40 MG/ML INJ SUSP (RADIOLOG
120.0000 mg | Freq: Once | INTRAMUSCULAR | Status: AC
  Administered 2016-12-31: 120 mg via EPIDURAL

## 2016-12-31 MED ORDER — IOPAMIDOL (ISOVUE-M 200) INJECTION 41%
1.0000 mL | Freq: Once | INTRAMUSCULAR | Status: AC
  Administered 2016-12-31: 1 mL via EPIDURAL

## 2016-12-31 NOTE — Discharge Instructions (Signed)

## 2017-01-12 DIAGNOSIS — D751 Secondary polycythemia: Secondary | ICD-10-CM | POA: Diagnosis not present

## 2017-01-12 DIAGNOSIS — E1165 Type 2 diabetes mellitus with hyperglycemia: Secondary | ICD-10-CM | POA: Diagnosis not present

## 2017-01-12 LAB — BASIC METABOLIC PANEL
BUN: 13 mg/dL (ref 4–21)
CREATININE: 0.8 mg/dL (ref 0.6–1.3)
Glucose: 135 mg/dL
Potassium: 4 mmol/L (ref 3.4–5.3)
Sodium: 140 mmol/L (ref 137–147)

## 2017-01-12 LAB — CBC AND DIFFERENTIAL
HCT: 47 % (ref 41–53)
HEMOGLOBIN: 15.7 g/dL (ref 13.5–17.5)
NEUTROS ABS: 66 /uL
PLATELETS: 290 10*3/uL (ref 150–399)
WBC: 9.8 10*3/mL

## 2017-01-12 LAB — HEMOGLOBIN A1C: Hemoglobin A1C: 7.1

## 2017-01-15 ENCOUNTER — Encounter: Payer: Self-pay | Admitting: Family Medicine

## 2017-01-19 DIAGNOSIS — R609 Edema, unspecified: Secondary | ICD-10-CM | POA: Diagnosis not present

## 2017-01-19 DIAGNOSIS — E1165 Type 2 diabetes mellitus with hyperglycemia: Secondary | ICD-10-CM | POA: Diagnosis not present

## 2017-01-19 DIAGNOSIS — I10 Essential (primary) hypertension: Secondary | ICD-10-CM | POA: Diagnosis not present

## 2017-01-19 DIAGNOSIS — E7800 Pure hypercholesterolemia, unspecified: Secondary | ICD-10-CM | POA: Diagnosis not present

## 2017-01-22 ENCOUNTER — Encounter: Payer: Medicare Other | Attending: Endocrinology | Admitting: *Deleted

## 2017-01-22 DIAGNOSIS — Z713 Dietary counseling and surveillance: Secondary | ICD-10-CM | POA: Insufficient documentation

## 2017-01-22 DIAGNOSIS — E119 Type 2 diabetes mellitus without complications: Secondary | ICD-10-CM | POA: Insufficient documentation

## 2017-01-22 DIAGNOSIS — E118 Type 2 diabetes mellitus with unspecified complications: Secondary | ICD-10-CM

## 2017-01-22 NOTE — Progress Notes (Signed)
Medical Nutrition Therapy: 01/22/2017  Appt start time:  0730    end time:  0800  Patient states his last A1c was 7.1 % as of last week  Assessment:  Primary concerns today: patient here for follow up diabetes education. Has cut back on desserts, increased walking with dog he is taking care of for a few weeks and he has started chair yoga. He has also started walking in the pool.    TANITA  BODY COMP RESULTS on 07/21/13  Weight 255.5 lb   BMI (kg/m^2) 38.8   Fat Mass (lbs) 97.5 lb   Fat Free Mass (lbs) 158.0 lb   Total Body Water (lbs) 115.5 lb   TANITA  BODY COMP RESULTS on 10/21/13  Weight 257.5 lb   BMI (kg/m^2) 39.2 lb   Fat Mass (lbs) 97.5 lb   Fat Free Mass (lbs) 160.0 lb   Total Body Water (lbs) 117.0 lb   TANITA  BODY COMP RESULTS on 01/20/14  Weight 248.5 lb   BMI (kg/m^2) 37.8   Fat Mass (lbs) 96.0 lb   Fat Free Mass (lbs) 152.5 lb   Total Body Water (lbs) 111.5 lb   TANITA  BODY COMP RESULTS  Weight 250.0 lb   BMI (kg/m^2) 38.0   Fat Mass (lbs) 100.0 lb   Fat Free Mass (lbs) 150.0 lb   Total Body Water (lbs) 110.0 lb   TANITA  BODY COMP RESULTS  07/27/2014 Weight 249.0 lb   BMI (kg/m^2) 37.9   Fat Mass (lbs) 103.5   Fat Free Mass (lbs) 145.5   Total Body Water (lbs) 106.5 lb   TANITA  BODY COMP RESULTS 10/12/2014  Weight 251 lb   BMI (kg/m^2) 38.2   Fat Mass (lbs) 92.0 lb   Fat Free Mass (lbs) 159.0 lb   Total Body Water (lbs) 116.5 lb   TANITA  BODY COMP RESULTS 12/07/2014   Weight 258 lb   BMI (kg/m^2) 39.2   Fat Mass (lbs) 86.5 lb   Fat Free Mass (lbs) 171.5 lb   Total Body Water (lbs) 125.5 lb    TANITA  BODY COMP RESULTS 05/10/15  Weight (lbs) 241.0 lb   BMI (kg/m^2) 36.6   Fat Mass (lbs) 93.5   Fat Free Mass (lbs) 147.5   Total Body Water (lbs) 108.0   TANITA  BODY COMP RESULTS 08/30/15  Weight Lbs) 235.0 lb   BMI (kg/m^2) 35.7   Fat Mass (lbs) 84.5   Fat Free Mass (lbs) 150.5   Total Body Water (lbs) 110.0   TANITA  BODY COMP RESULTS  12/06/15  Weight (lbs) 237.5   BMI (kg/m^2) 36.1   Fat Mass (lbs) 89.0   Fat Free Mass (lbs) 148.5   Total Body Water (lbs) 108.5   TANITA  BODY COMP RESULTS 03/07/16  Weight (lbs) 252.5 lb   BMI (kg/m^2) 38.4   Fat Mass (lbs) 95.5   Fat Free Mass (lbs) 157.0   Total Body Water (lbs) 115.0   TANITA  BODY COMP RESULTS 06/13/2016  Weight (lbs) 250.0   BMI (kg/m^2) 38.0   Fat Mass (lbs) 93.0   Fat Free Mass (lbs) 157.0   Total Body Water (lbs) 115.0   TANITA  BODY COMP RESULTS 09/18/2016  Weight (lbs) 242.0   BMI (kg/m^2) 36.8   Fat Mass (lbs) 102   Fat Free Mass (lbs) 139   Total Body Water (lbs) 106   TANITA  BODY COMP RESULTS 01/22/2017  Weight (lbs) 245.2  BMI (kg/m^2) 37.3   Fat Mass (lbs) 104.8   Fat Free Mass (lbs) 140.4   Total Body Water (lbs) 44.0     MEDICATIONS: see list, Diabetes medications are now Amaryl, Metformin and Lantus insulin Patient states he has continued to reduce Lantus as directed by Dr. Chalmers Cater and is currenlty taking only 5 units/day.   Usual physical activity: increasing to normal levels gradually    Intervention: Acknowledged his continued success with controlling his BG effectively and continuing to work on weight loss  Plan:  Continue to aim for 3 Carb Choices per meal (45 grams) +/- 1 either way  Continue to aim for 0-2 Carbs per snack if hungry  Continue reading food labels for Total Carbohydrate and Fat Grams of foods Continue with your activity level daily as tolerated Continue checking BG at alternate times per day on occasion Continue taking Lantus at night as directed by your MD    Handouts given during visit include: Tanita Scale results handout  Monitoring/Evaluation:  Dietary intake, exercise, reading food labels, and body weight in 3 months.

## 2017-01-26 DIAGNOSIS — R310 Gross hematuria: Secondary | ICD-10-CM | POA: Diagnosis not present

## 2017-01-26 DIAGNOSIS — N400 Enlarged prostate without lower urinary tract symptoms: Secondary | ICD-10-CM | POA: Diagnosis not present

## 2017-01-28 ENCOUNTER — Encounter: Payer: Self-pay | Admitting: Family Medicine

## 2017-01-28 ENCOUNTER — Ambulatory Visit (INDEPENDENT_AMBULATORY_CARE_PROVIDER_SITE_OTHER): Payer: Medicare Other | Admitting: Family Medicine

## 2017-01-28 VITALS — BP 110/70 | HR 109 | Temp 98.1°F | Ht 66.5 in | Wt 248.3 lb

## 2017-01-28 DIAGNOSIS — E1140 Type 2 diabetes mellitus with diabetic neuropathy, unspecified: Secondary | ICD-10-CM | POA: Diagnosis not present

## 2017-01-28 DIAGNOSIS — Z0000 Encounter for general adult medical examination without abnormal findings: Secondary | ICD-10-CM

## 2017-01-28 DIAGNOSIS — Z720 Tobacco use: Secondary | ICD-10-CM | POA: Diagnosis not present

## 2017-01-28 DIAGNOSIS — I10 Essential (primary) hypertension: Secondary | ICD-10-CM | POA: Diagnosis not present

## 2017-01-28 DIAGNOSIS — E785 Hyperlipidemia, unspecified: Secondary | ICD-10-CM | POA: Diagnosis not present

## 2017-01-28 NOTE — Progress Notes (Signed)
Subjective:     Patient ID: Steve Charles, male   DOB: 02-04-1946, 71 y.o.   MRN: [id number]  HPI Patient is seen for medical follow-up. He was initially scheduled for "physical" but he had Medicare subsequent annual wellness visit in September and has Medicare as his primary insurance.Marland Kitchen His chronic problems include history of obesity, obstructive sleep apnea, osteoarthritis, type 2 diabetes, history of TIA, ongoing nicotine use, and chronic back pain. He is followed by cardiology and endocrinology. Recent A1c 7.1%. Lipids have been well controlled.  His history of BPH and has had previous TURP. He had episode of gross hematuria about a week ago. He has scheduled cystoscopy with urology soon. Still smoking about half pack cigarettes per day. Over 30 pack year history.  Immunizations up-to-date. He is undecided regarding new shingles vaccine. Will need repeat colonoscopy in 2 years.  Medications reviewed and compliant with all. Blood pressure tend to run on the low side but he has no dizziness whatsoever. He has frequent systolic readings around 431. Stays well-hydrated.  Past Medical History:  Diagnosis Date  . Arthritis    "entire spine; both of my hands" (06/04/2016)  . BPH (benign prostatic hypertrophy)   . CAD (coronary artery disease)   . Chronic lower back pain   . COPD (chronic obstructive pulmonary disease) ([place]) 05/01/2011   PFT 05/01/11>>FEV1 2.07(72%), FEV1% 65, TLC 6.79(111%), DLCO 69%, no BD  . DYSLIPIDEMIA 11/29/2009  . Dysrhythmia    SVT  . Exposure to TB 9 yrs ago   no active infection  . HYPERTENSION, ESSENTIAL 11/29/2009  . OSA on CPAP 03/26/2011   CPAP 9 cm H2O  . Osteoarthritis   . Pneumonia 1990s  . SVT (supraventricular tachycardia) ([place])   . Type II diabetes mellitus ([place])   . URTICARIA 02/06/2010   Past Surgical History:  Procedure Laterality Date  . ANTERIOR CERVICAL DECOMP/DISCECTOMY FUSION  06/04/2016   C3-4 and C4-5 anterior cervical decompression and  arthrodesis with structural allograft and anterior cervical plating  . ANTERIOR CERVICAL DECOMP/DISCECTOMY FUSION N/A 06/04/2016   Procedure: CERVICAL THREE-FOUR, CERVICAL FOUR-FIVE ANTERIOR CERVICAL DECOMPRESSION/DISCECTOMY FUSION;  Surgeon: Jovita Gamma, MD;  Location: [facility] [facility];  Service: Neurosurgery;  Laterality: N/A;  . APPENDECTOMY  1963  . BACK SURGERY    . BILATERAL CARPAL TUNNEL RELEASE Bilateral   . CARDIAC CATHETERIZATION     "[facility]"  . CATARACT EXTRACTION W/ INTRAOCULAR LENS  IMPLANT, BILATERAL Bilateral   . JOINT REPLACEMENT    . NASAL SINUS SURGERY  1989  . POSTERIOR LUMBAR FUSION     L4, L5  . TOTAL KNEE ARTHROPLASTY Left 2007  . TRANSURETHRAL RESECTION OF PROSTATE     bph  . ULNAR TUNNEL RELEASE Right     reports that he quit smoking about 9 months ago. His smoking use included Cigarettes. He has a 55.00 pack-year smoking history. He has never used smokeless tobacco. He reports that he drinks about 0.6 oz of alcohol per week . He reports that he does not use drugs. family history includes Arthritis in his other; Cancer in his father; Diabetes in his other; Prostate cancer in his brother. Allergies  Allergen Reactions  . Codeine Sulfate Hives  . Morphine And Related Hives    Oral morphine/ hives   . Propoxyphene N-Acetaminophen Hives  . Ramipril Hives  . Shellfish Allergy Hives     Review of Systems  Constitutional: Negative for fatigue.  HENT: Negative for trouble swallowing.   Eyes: Negative for visual  disturbance.  Respiratory: Negative for cough, chest tightness and shortness of breath.   Cardiovascular: Negative for chest pain, palpitations and leg swelling.  Gastrointestinal: Negative for abdominal pain.  Endocrine: Negative for polydipsia and polyuria.  Genitourinary: Positive for hematuria. Negative for dysuria and frequency.  Musculoskeletal: Positive for arthralgias and back pain.  Neurological: Negative for dizziness, syncope, weakness,  light-headedness and headaches.       Objective:   Physical Exam  Constitutional: He is oriented to person, place, and time. He appears well-developed and well-nourished. No distress.  HENT:  Head: Normocephalic and atraumatic.  Right Ear: External ear normal.  Left Ear: External ear normal.  Mouth/Throat: Oropharynx is clear and moist.  Eyes: Conjunctivae and EOM are normal. Pupils are equal, round, and reactive to light.  Neck: Normal range of motion. Neck supple. No thyromegaly present.  Cardiovascular: Normal rate, regular rhythm and normal heart sounds.   No murmur heard. Pulmonary/Chest: No respiratory distress. He has no wheezes. He has no rales.  Abdominal: Soft. Bowel sounds are normal. He exhibits no distension and no mass. There is no tenderness. There is no rebound and no guarding.  Genitourinary:  Genitourinary Comments: Recent exam per urology  Musculoskeletal: He exhibits no edema.  Lymphadenopathy:    He has no cervical adenopathy.  Neurological: He is alert and oriented to person, place, and time. He displays normal reflexes. No cranial nerve deficit.  Skin: No rash noted.  Psychiatric: He has a normal mood and affect.       Assessment:     #1 type 2 diabetes with good control with recent A1c 7.1%  #2 hypertension stable and at goal  #3 recent gross hematuria currently in process of workup per urology  #4 ongoing nicotine use    Plan:     -Discussed pros and cons of low-dose CT lung cancer screening and he is interested in pursuing this. He states he has been screened once previously -Continue close follow-up with urology -Repeat colonoscopy in 2 years -Is strongly encouraged to stop smoking  [name] MD [facility]

## 2017-01-28 NOTE — Patient Instructions (Signed)
We will set up referral for low dose CT lung cancer screen.

## 2017-01-28 NOTE — Progress Notes (Signed)
Pre visit review using our clinic review tool, if applicable. No additional management support is needed unless otherwise documented below in the visit note. 

## 2017-02-02 DIAGNOSIS — N400 Enlarged prostate without lower urinary tract symptoms: Secondary | ICD-10-CM | POA: Diagnosis not present

## 2017-02-02 DIAGNOSIS — R310 Gross hematuria: Secondary | ICD-10-CM | POA: Diagnosis not present

## 2017-02-03 ENCOUNTER — Other Ambulatory Visit: Payer: Self-pay | Admitting: Acute Care

## 2017-02-03 DIAGNOSIS — F17210 Nicotine dependence, cigarettes, uncomplicated: Principal | ICD-10-CM

## 2017-02-09 DIAGNOSIS — E119 Type 2 diabetes mellitus without complications: Secondary | ICD-10-CM | POA: Diagnosis not present

## 2017-02-09 LAB — HM DIABETES EYE EXAM

## 2017-02-11 ENCOUNTER — Ambulatory Visit (INDEPENDENT_AMBULATORY_CARE_PROVIDER_SITE_OTHER)
Admission: RE | Admit: 2017-02-11 | Discharge: 2017-02-11 | Disposition: A | Discharge status: Home / Self Care | Payer: Medicare Other | Source: Ambulatory Visit | Attending: Acute Care | Admitting: Acute Care

## 2017-02-11 ENCOUNTER — Encounter: Payer: Self-pay | Admitting: Acute Care

## 2017-02-11 ENCOUNTER — Ambulatory Visit (INDEPENDENT_AMBULATORY_CARE_PROVIDER_SITE_OTHER): Payer: Medicare Other | Admitting: Acute Care

## 2017-02-11 DIAGNOSIS — F17210 Nicotine dependence, cigarettes, uncomplicated: Secondary | ICD-10-CM | POA: Diagnosis not present

## 2017-02-11 DIAGNOSIS — Z87891 Personal history of nicotine dependence: Secondary | ICD-10-CM

## 2017-02-11 NOTE — Progress Notes (Signed)
Shared Decision Making Visit Lung Cancer Screening Program [phone])   Eligibility:  Age 71 y.o.  Pack Years Smoking History Calculation 72 pack year smoker (# packs/per year x # years smoked)  Recent History of coughing up blood  no  Unexplained weight loss? no ( >Than 15 pounds within the last 6 months )  Prior History Lung / other cancer no (Diagnosis within the last 5 years already requiring surveillance chest CT Scans).  Smoking Status Current Smoker  Former Smokers: Years since quit: NA  Quit Date: NA  Visit Components:  Discussion included one or more decision making aids. yes  Discussion included risk/benefits of screening. yes  Discussion included potential follow up diagnostic testing for abnormal scans. yes  Discussion included meaning and risk of over diagnosis. yes  Discussion included meaning and risk of False Positives. yes  Discussion included meaning of total radiation exposure. yes  Counseling Included:  Importance of adherence to annual lung cancer LDCT screening. yes  Impact of comorbidities on ability to participate in the program. yes  Ability and willingness to under diagnostic treatment. yes  Smoking Cessation Counseling:  Current Smokers:   Discussed importance of smoking cessation. yes  Information about tobacco cessation classes and interventions provided to patient. yes  Patient provided with "ticket" for LDCT Scan. yes  Symptomatic Patient. no  Counseling  Diagnosis Code: Tobacco Use Z72.0  Asymptomatic Patient yes  Counseling (Intermediate counseling: > three minutes counseling) H4742  Former Smokers:   Discussed the importance of maintaining cigarette abstinence. yes  Diagnosis Code: Personal History of Nicotine Dependence. V95.638  Information about tobacco cessation classes and interventions provided to patient. Yes  Patient provided with "ticket" for LDCT Scan. yes  Written Order for Lung Cancer Screening with  LDCT placed in Epic. Yes (CT Chest Lung Cancer Screening Low Dose W/O CM) [id number] Z12.2-Screening of respiratory organs Z87.891-Personal history of nicotine dependence  I have spent 25 minutes of face to face time with Steve Charles  discussing the risks and benefits of lung cancer screening. We viewed a power point together that explained in detail the above noted topics. We paused at intervals to allow for questions to be asked and answered to ensure understanding.We discussed that the single most powerful action that he can take to decrease his risk of developing lung cancer is to quit smoking. We discussed whether or not he is ready to commit to setting a quit date. He is currently not ready to set a quit date. We discussed options for tools to aid in quitting smoking including nicotine replacement therapy, non-nicotine medications, support groups, Quit Smart classes, and behavior modification. We discussed that often times setting smaller, more achievable goals, such as eliminating 1 cigarette a day for a week and then 2 cigarettes a day for a week can be helpful in slowly decreasing the number of cigarettes smoked. This allows for a sense of accomplishment as well as providing a clinical benefit. I gave him the " Be Stronger Than Your Excuses" card with contact information for community resources, classes, free nicotine replacement therapy, and access to mobile apps, text messaging, and on-line smoking cessation help. I have also given him my card and contact information in the event he needs to contact me. We discussed the time and location of the scan, and that either Selinda Flavin, RN or I will call with the results within 24-48 hours of receiving them. I have provided him with a copy of the power point we viewed  as a resource in the event they need reinforcement of the concepts we discussed today in the office. The patient verbalized understanding of all of  the above and had no further questions  upon leaving the office. They have my contact information in the event they have any further questions.   I spent 3-4 minutes counseling on smoking cessation during this visit.  I explained to the patient that there has been a high incidence of coronary artery disease noted on these scans. I explained that this is a non-gated exam therefore degree or severity of disease cannot be determined. The patient is currently on statin therapy per his primary care provider. I explained that we will send a copy of the results to his primary care provider for completeness of his medical record. He verbalized understanding of the above.   Steve Spatz, NP 02/11/2017

## 2017-02-18 ENCOUNTER — Encounter: Payer: Self-pay | Admitting: Family Medicine

## 2017-02-18 ENCOUNTER — Other Ambulatory Visit: Payer: Self-pay | Admitting: Acute Care

## 2017-02-18 DIAGNOSIS — F17210 Nicotine dependence, cigarettes, uncomplicated: Principal | ICD-10-CM

## 2017-02-26 ENCOUNTER — Encounter: Payer: Self-pay | Admitting: Cardiology

## 2017-02-26 ENCOUNTER — Ambulatory Visit (INDEPENDENT_AMBULATORY_CARE_PROVIDER_SITE_OTHER): Payer: Medicare Other | Admitting: Cardiology

## 2017-02-26 VITALS — BP 122/68 | HR 102 | Ht 66.0 in | Wt 238.0 lb

## 2017-02-26 DIAGNOSIS — Z720 Tobacco use: Secondary | ICD-10-CM

## 2017-02-26 DIAGNOSIS — I471 Supraventricular tachycardia: Secondary | ICD-10-CM | POA: Diagnosis not present

## 2017-02-26 DIAGNOSIS — I2510 Atherosclerotic heart disease of native coronary artery without angina pectoris: Secondary | ICD-10-CM

## 2017-02-26 DIAGNOSIS — I10 Essential (primary) hypertension: Secondary | ICD-10-CM

## 2017-02-26 NOTE — Patient Instructions (Signed)
Medication Instructions:   NO CHANGE  Testing/Procedures:  Your physician has requested that you have en exercise stress myoview. For further information please visit [email or web address]. Please follow instruction sheet, as given.TAKE ALL MEDICATIONS    Follow-Up:  Your physician wants you to follow-up in: [place] will receive a reminder letter in the mail two months in advance. If you don't receive a letter, please call our office to schedule the follow-up appointment.   If you need a refill on your cardiac medications before your next appointment, please call your pharmacy.

## 2017-02-26 NOTE — Progress Notes (Signed)
HPI: FU CAD and SVT. Patient states that he has had atrial fibrillation after surgeries in the past. Cardiac cath in April of 2012 revealed normal LV function, normal pulmonary pressures and pulmonary capillary wedge pressure and nonobstructive coronary disease with the most significant lesion being a 50% LAD. Carotid Dopplers in May of 2013 were normal. Echocardiogram repeated in Jan of 2015. This revealed normal LV function, grade 1 diastolic dysfunction. Nuclear study 7/15 showed EF 64 and normal perfusion. Abdominal ultrasound October 2016 showed no aneurysm. Patient also with SVT treated with verapamil. Holter monitor November 2016 showed sinus rhythm with frequent PVCs and PACs. There are occasional ventricular couplets. Since last seen, patient denies dyspnea, chest pain, palpitations or syncope. He describes intermittent cold feet but denies claudication. He had a screening CT scan recently for lung cancer which revealed calcium in his coronaries.  Current Outpatient Prescriptions  Medication Sig Dispense Refill  . aspirin 81 MG tablet Take 81 mg by mouth every evening.     . CHROMIUM-CINNAMON PO Take 2,000 mg by mouth daily.    . Cyanocobalamin (VITAMIN B 12 PO) Take 1 tablet by mouth daily.    . furosemide (LASIX) 40 MG tablet Take 40 mg by mouth daily as needed (swelling).    . gabapentin (NEURONTIN) 300 MG capsule Take 300 mg by mouth 3 (three) times daily.    Marland Kitchen glimepiride (AMARYL) 1 MG tablet Take 1 mg by mouth 2 (two) times daily. Reported on 11/21/2015  4  . LANTUS SOLOSTAR 100 UNIT/ML Solostar Pen Inject 15 Units into the skin daily at 10 pm.   1  . metFORMIN (GLUCOPHAGE) 500 MG tablet Take 1,000 mg by mouth 2 (two) times daily with a meal.     . pravastatin (PRAVACHOL) 40 MG tablet TAKE ONE TABLET EVERY EVENING 90 tablet 2  . Turmeric 450 MG CAPS Take 450 mg by mouth daily.    . verapamil (CALAN-SR) 240 MG CR tablet TAKE ONE TABLET EACH DAY 90 tablet 2   No current  facility-administered medications for this visit.      Past Medical History:  Diagnosis Date  . Arthritis    "entire spine; both of my hands" (06/04/2016)  . BPH (benign prostatic hypertrophy)   . CAD (coronary artery disease)   . Chronic lower back pain   . COPD (chronic obstructive pulmonary disease) ([place]) 05/01/2011   PFT 05/01/11>>FEV1 2.07(72%), FEV1% 65, TLC 6.79(111%), DLCO 69%, no BD  . DYSLIPIDEMIA 11/29/2009  . Dysrhythmia    SVT  . Exposure to TB 9 yrs ago   no active infection  . HYPERTENSION, ESSENTIAL 11/29/2009  . OSA on CPAP 03/26/2011   CPAP 9 cm H2O  . Osteoarthritis   . Pneumonia 1990s  . SVT (supraventricular tachycardia) ([place])   . Type II diabetes mellitus ([place])   . URTICARIA 02/06/2010    Past Surgical History:  Procedure Laterality Date  . ANTERIOR CERVICAL DECOMP/DISCECTOMY FUSION  06/04/2016   C3-4 and C4-5 anterior cervical decompression and arthrodesis with structural allograft and anterior cervical plating  . ANTERIOR CERVICAL DECOMP/DISCECTOMY FUSION N/A 06/04/2016   Procedure: CERVICAL THREE-FOUR, CERVICAL FOUR-FIVE ANTERIOR CERVICAL DECOMPRESSION/DISCECTOMY FUSION;  Surgeon: Jovita Gamma, MD;  Location: [place];  Service: Neurosurgery;  Laterality: N/A;  . APPENDECTOMY  1963  . BACK SURGERY    . BILATERAL CARPAL TUNNEL RELEASE Bilateral   . CARDIAC CATHETERIZATION     "[facility]"  . CATARACT EXTRACTION W/ INTRAOCULAR LENS  IMPLANT,  BILATERAL Bilateral   . JOINT REPLACEMENT    . NASAL SINUS SURGERY  1989  . POSTERIOR LUMBAR FUSION     L4, L5  . TOTAL KNEE ARTHROPLASTY Left 2007  . TRANSURETHRAL RESECTION OF PROSTATE     bph  . ULNAR TUNNEL RELEASE Right     Social History   Social History  . Marital status: Married    Spouse name: N/A  . Number of children: 3  . Years of education: N/A   Occupational History  . Retired Other   Social History Main Topics  . Smoking status: Former Smoker    Packs/day: 1.00    Years: 57.00     Types: Cigarettes    Quit date: 04/29/2016  . Smokeless tobacco: Never Used  . Alcohol use 0.6 oz/week    1 Shots of liquor per week  . Drug use: No  . Sexual activity: Not Currently   Other Topics Concern  . Not on file   Social History Narrative  . No narrative on file    Family History  Problem Relation Age of Onset  . Prostate cancer Brother   . Cancer Father   . Arthritis Other   . Diabetes Other     ROS: Cold feet but no fevers or chills, productive cough, hemoptysis, dysphasia, odynophagia, melena, hematochezia, dysuria, hematuria, rash, seizure activity, orthopnea, PND, pedal edema, claudication. Remaining systems are negative.  Physical Exam: Well-developed obese in no acute distress.  Skin is warm and dry.  HEENT is normal.  Neck is supple. No bruits Chest is clear to auscultation with normal expansion.  Cardiovascular exam is regular rate and rhythm. No murmur Abdominal exam nontender or distended. No masses palpated. Extremities show no edema. Varicosities noted neuro grossly intact  ECG- Sinus tachycardia with occasional PAC and PVC. No ST changes. Left axis deviation. personally reviewed  A/P  1 Coronary artery disease-continue aspirin and statin. Patient is concerned about calcium noted in his coronaries. He has been 3 years since his last stress test. We will arrange a stress nuclear study for risk stratification.  2 supraventricular tachycardia-continue verapamil.  3 hypertension-blood pressure controlled. Continue present medications.  4 hyperlipidemia-continue statin. Check lipids and liver.  5 tobacco abuse-patient counseled on discontinuing.  6 edema-continue present dose of Lasix. Laboratories from March 2018 reviewed. Creatinine 0.8 and potassium 4.0.   Kirk Ruths, MD

## 2017-03-03 ENCOUNTER — Telehealth (HOSPITAL_COMMUNITY): Payer: Self-pay

## 2017-03-03 NOTE — Telephone Encounter (Signed)
Encounter complete. 

## 2017-03-05 ENCOUNTER — Ambulatory Visit (HOSPITAL_COMMUNITY)
Admission: RE | Admit: 2017-03-05 | Discharge: 2017-03-05 | Disposition: A | Discharge status: Home / Self Care | Payer: Medicare Other | Source: Ambulatory Visit | Attending: Cardiology | Admitting: Cardiology

## 2017-03-05 DIAGNOSIS — I2510 Atherosclerotic heart disease of native coronary artery without angina pectoris: Secondary | ICD-10-CM

## 2017-03-05 LAB — MYOCARDIAL PERFUSION IMAGING
CHL CUP MPHR: 148 {beats}/min
CHL CUP NUCLEAR SRS: 3
CHL CUP NUCLEAR SSS: 3
CSEPED: 6 min
CSEPEDS: 31 s
CSEPEW: 7.1 METS
LV dias vol: 90 mL (ref 62–150)
LVSYSVOL: 46 mL
Peak HR: 129 {beats}/min
Percent HR: 87 %
RPE: 18
Rest HR: 84 {beats}/min
SDS: 0
TID: 0.92

## 2017-03-05 MED ORDER — TECHNETIUM TC 99M TETROFOSMIN IV KIT
10.2000 | PACK | Freq: Once | INTRAVENOUS | Status: AC | PRN
  Administered 2017-03-05: 10.2 via INTRAVENOUS
  Filled 2017-03-05: qty 11

## 2017-03-05 MED ORDER — TECHNETIUM TC 99M TETROFOSMIN IV KIT
30.1000 | PACK | Freq: Once | INTRAVENOUS | Status: AC | PRN
  Administered 2017-03-05: 30.1 via INTRAVENOUS
  Filled 2017-03-05: qty 31

## 2017-03-23 ENCOUNTER — Ambulatory Visit (INDEPENDENT_AMBULATORY_CARE_PROVIDER_SITE_OTHER): Payer: Medicare Other | Admitting: Family Medicine

## 2017-03-23 ENCOUNTER — Encounter: Payer: Self-pay | Admitting: Family Medicine

## 2017-03-23 VITALS — BP 110/70 | HR 83 | Temp 98.3°F | Wt 253.7 lb

## 2017-03-23 DIAGNOSIS — L853 Xerosis cutis: Secondary | ICD-10-CM

## 2017-03-23 DIAGNOSIS — I2510 Atherosclerotic heart disease of native coronary artery without angina pectoris: Secondary | ICD-10-CM

## 2017-03-23 MED ORDER — TRIAMCINOLONE ACETONIDE 0.1 % EX CREA: 1.0000 "application " | TOPICAL_CREAM | Freq: Two times a day (BID) | CUTANEOUS | 1 refills | Status: DC | PRN

## 2017-03-23 NOTE — Progress Notes (Signed)
Subjective:     Patient ID: Steve Charles, male   DOB: 1946-05-05, 70 y.o.   MRN: [id number]  HPI Patient seen with itching involving both lower legs. Present for about one week. Denies any recent change of soaps. He's been scratching frequently. No recent change of medication. Denies any trunk or upper extremity symptoms. Severity at itching is moderate. No alleviating factors.  Past Medical History:  Diagnosis Date  . Arthritis    "entire spine; both of my hands" (06/04/2016)  . BPH (benign prostatic hypertrophy)   . CAD (coronary artery disease)   . Chronic lower back pain   . COPD (chronic obstructive pulmonary disease) ([place]) 05/01/2011   PFT 05/01/11>>FEV1 2.07(72%), FEV1% 65, TLC 6.79(111%), DLCO 69%, no BD  . DYSLIPIDEMIA 11/29/2009  . Dysrhythmia    SVT  . Exposure to TB 9 yrs ago   no active infection  . HYPERTENSION, ESSENTIAL 11/29/2009  . OSA on CPAP 03/26/2011   CPAP 9 cm H2O  . Osteoarthritis   . Pneumonia 1990s  . SVT (supraventricular tachycardia) ([place])   . Type II diabetes mellitus ([place])   . URTICARIA 02/06/2010   Past Surgical History:  Procedure Laterality Date  . ANTERIOR CERVICAL DECOMP/DISCECTOMY FUSION  06/04/2016   C3-4 and C4-5 anterior cervical decompression and arthrodesis with structural allograft and anterior cervical plating  . ANTERIOR CERVICAL DECOMP/DISCECTOMY FUSION N/A 06/04/2016   Procedure: CERVICAL THREE-FOUR, CERVICAL FOUR-FIVE ANTERIOR CERVICAL DECOMPRESSION/DISCECTOMY FUSION;  Surgeon: Jovita Gamma, MD;  Location: [place] [facility];  Service: Neurosurgery;  Laterality: N/A;  . APPENDECTOMY  1963  . BACK SURGERY    . BILATERAL CARPAL TUNNEL RELEASE Bilateral   . CARDIAC CATHETERIZATION     "[place]"  . CATARACT EXTRACTION W/ INTRAOCULAR LENS  IMPLANT, BILATERAL Bilateral   . JOINT REPLACEMENT    . NASAL SINUS SURGERY  1989  . POSTERIOR LUMBAR FUSION     L4, L5  . TOTAL KNEE ARTHROPLASTY Left 2007  . TRANSURETHRAL RESECTION OF PROSTATE      bph  . ULNAR TUNNEL RELEASE Right     reports that he quit smoking about 10 months ago. His smoking use included Cigarettes. He has a 57.00 pack-year smoking history. He has never used smokeless tobacco. He reports that he drinks about 0.6 oz of alcohol per week . He reports that he does not use drugs. family history includes Arthritis in his other; Cancer in his father; Diabetes in his other; Prostate cancer in his brother. Allergies  Allergen Reactions  . Codeine Sulfate Hives  . Morphine And Related Hives    Oral morphine/ hives   . Propoxyphene N-Acetaminophen Hives  . Ramipril Hives  . Shellfish Allergy Hives     Review of Systems  Constitutional: Negative for chills and fever.  Skin: Positive for rash.       Objective:   Physical Exam  Constitutional: He appears well-developed and well-nourished.  Cardiovascular: Normal rate and regular rhythm.   Pulmonary/Chest: Effort normal and breath sounds normal. No respiratory distress. He has no wheezes. He has no rales.  Skin:  Patient has relatively mild dryness involving both lower legs. He has a few excoriations. He does have some hyperpigmentation lower legs consistent with venous stasis but no evidence for likely stasis dermatitis        Assessment:     Probable dry skin dermatitis. Doubt venous stasis dermatitis    Plan:     -Triamcinolone 0.1% cream to use twice daily as needed -Avoid  scratching is much as possible -Also use concomitant moisturizer with approximately 50/50 ratio with triamcinolone  [name] MD [facility]

## 2017-03-23 NOTE — Patient Instructions (Signed)
Try to avoid scratching legs as much as possible Use medicated cream twice daily as needed

## 2017-03-24 ENCOUNTER — Telehealth: Payer: Self-pay | Admitting: Family Medicine

## 2017-03-24 MED ORDER — GABAPENTIN 300 MG PO CAPS: 300.0000 mg | ORAL_CAPSULE | Freq: Three times a day (TID) | ORAL | 1 refills | Status: DC

## 2017-03-24 NOTE — Telephone Encounter (Signed)
OK to refill for 6 months 

## 2017-03-24 NOTE — Telephone Encounter (Signed)
Pt was prescribed gabapentin 300 mg from dr Sherwood Gambler and would like dr burchette to take over prescribing this med. Brown gardiner. Pt would like 90 day supply w/refills

## 2017-04-07 ENCOUNTER — Ambulatory Visit (INDEPENDENT_AMBULATORY_CARE_PROVIDER_SITE_OTHER): Payer: Medicare Other | Admitting: Student

## 2017-04-07 ENCOUNTER — Telehealth: Payer: Self-pay

## 2017-04-07 ENCOUNTER — Encounter: Payer: Self-pay | Admitting: Student

## 2017-04-07 VITALS — BP 120/60 | HR 78 | Ht 67.0 in | Wt 254.0 lb

## 2017-04-07 DIAGNOSIS — I10 Essential (primary) hypertension: Secondary | ICD-10-CM | POA: Diagnosis not present

## 2017-04-07 DIAGNOSIS — R002 Palpitations: Secondary | ICD-10-CM | POA: Diagnosis not present

## 2017-04-07 DIAGNOSIS — I471 Supraventricular tachycardia, unspecified: Secondary | ICD-10-CM

## 2017-04-07 DIAGNOSIS — E785 Hyperlipidemia, unspecified: Secondary | ICD-10-CM

## 2017-04-07 DIAGNOSIS — I2510 Atherosclerotic heart disease of native coronary artery without angina pectoris: Secondary | ICD-10-CM

## 2017-04-07 MED ORDER — METOPROLOL TARTRATE 25 MG PO TABS: 25.0000 mg | ORAL_TABLET | Freq: Two times a day (BID) | ORAL | 5 refills | Status: AC

## 2017-04-07 NOTE — Patient Instructions (Signed)
Medication Instructions:  START METOPROLOL 25MG  TWICE DAILY AS NEEDED FOR PALPITATIONS  If you need a refill on your cardiac medications before your next appointment, please call your pharmacy.  Labwork: BMP AND MAG TODAY HERE IN OUR OFFICE AT [facility]  Follow-Up: Your physician wants you to follow-up in: [place] ON July 13,2018 @9AM .   Thank you for choosing [facility] at [facility]!!

## 2017-04-07 NOTE — Telephone Encounter (Signed)
Patient walked in office complaining of fast heart beat.Stated yesterday he noticed heart started flipping.He gets lightheaded.No chest pain.No sob.Stated for the past 2 weeks he has been having episodes of heart beating fast,just not feeling good.EKG was done rate 84.B/P 100/64.Stated he is feeling better at present.Appointment scheduled with Bernerd Pho PA this afternoon at 1:30 pm.

## 2017-04-07 NOTE — Progress Notes (Signed)
Cardiology Office Note    Date:  04/07/2017   ID:  Steve Charles, DOB 10/24/1946, MRN [id number]  PCP:  [name], MD  Cardiologist: Dr. Stanford Breed   Chief Complaint  Patient presents with  . Palpitations    History of Present Illness:    Steve Charles is a 71 y.o. male with past medical history of pSVT, post-op atrial fibrillation, CAD (50% LAD stenosis by cath in 2012), HTN, HLD, and tobacco use who presents to the office today as an add-on for evaluation of palpitations.   He was last examined by Dr. Stanford Breed on 02/26/2017 and reported doing well from a cardiac perspective at that time. He denied any recent chest pain, palpitations, dyspnea or presyncope. Recent stress test on 03/05/2017 showed no evidence of ischemia and was overall low-risk.   In talking with the patient today, he reports developing palpitations starting yesterday afternoon. His symptoms lasted for over 6 hours and spontaneously resolved. He denies any associated chest discomfort, dyspnea, lightheadedness, dizziness, or presyncope. Upon waking this morning, his palpitations returned and remain present at this time. He says he does not feel his heart racing but notes that it is "flipping". Reports this feels different from prior episodes of SVT or atrial fibrillation. Reports his heart rate has been controlled in the 70's to 80's.  At the time he walked in earlier this morning, an EKG was obtained which had baseline artifact and was difficult to discern as a junctional rhythm versus sinus rhythm. The rhythm was regular and rate was well-controlled at 84. A repeat EKG along with a rhythm strip was obtained this afternoon and shows definitive normal sinus rhythm with P-waves noted throughout. No PACs or PVCs are picked up. He reports having the same "flipping" sensation in his heart at the time these tracings were performed.   Past Medical History:  Diagnosis Date  . Arthritis    "entire spine; both of my  hands" (06/04/2016)  . BPH (benign prostatic hypertrophy)   . CAD (coronary artery disease)    a. 50% LAD stenosis by cath in 2012 b. 03/2017: NST showing no evidence of ischemia, overall low-risk.   . Chronic lower back pain   . COPD (chronic obstructive pulmonary disease) ([place]) 05/01/2011   PFT 05/01/11>>FEV1 2.07(72%), FEV1% 65, TLC 6.79(111%), DLCO 69%, no BD  . DYSLIPIDEMIA 11/29/2009  . Dysrhythmia    SVT  . Exposure to TB 9 yrs ago   no active infection  . HYPERTENSION, ESSENTIAL 11/29/2009  . OSA on CPAP 03/26/2011   CPAP 9 cm H2O  . Osteoarthritis   . Pneumonia 1990s  . SVT (supraventricular tachycardia) ([place])   . Type II diabetes mellitus ([place])   . URTICARIA 02/06/2010    Past Surgical History:  Procedure Laterality Date  . ANTERIOR CERVICAL DECOMP/DISCECTOMY FUSION  06/04/2016   C3-4 and C4-5 anterior cervical decompression and arthrodesis with structural allograft and anterior cervical plating  . ANTERIOR CERVICAL DECOMP/DISCECTOMY FUSION N/A 06/04/2016   Procedure: CERVICAL THREE-FOUR, CERVICAL FOUR-FIVE ANTERIOR CERVICAL DECOMPRESSION/DISCECTOMY FUSION;  Surgeon: Jovita Gamma, MD;  Location: [facility];  Service: Neurosurgery;  Laterality: N/A;  . APPENDECTOMY  1963  . BACK SURGERY    . BILATERAL CARPAL TUNNEL RELEASE Bilateral   . CARDIAC CATHETERIZATION     "[place]"  . CATARACT EXTRACTION W/ INTRAOCULAR LENS  IMPLANT, BILATERAL Bilateral   . JOINT REPLACEMENT    . NASAL SINUS SURGERY  1989  . POSTERIOR LUMBAR FUSION  L4, L5  . TOTAL KNEE ARTHROPLASTY Left 2007  . TRANSURETHRAL RESECTION OF PROSTATE     bph  . ULNAR TUNNEL RELEASE Right     Current Medications: Outpatient Medications Prior to Visit  Medication Sig Dispense Refill  . aspirin 81 MG tablet Take 81 mg by mouth every evening.     . CHROMIUM-CINNAMON PO Take 2,000 mg by mouth daily.    . Cyanocobalamin (VITAMIN B 12 PO) Take 1 tablet by mouth daily.    . finasteride (PROSCAR) 5 MG tablet  Take 5 mg by mouth daily.    Marland Kitchen gabapentin (NEURONTIN) 300 MG capsule Take 1 capsule (300 mg total) by mouth 3 (three) times daily. 270 capsule 1  . glimepiride (AMARYL) 1 MG tablet Take 1 mg by mouth 2 (two) times daily. Reported on 11/21/2015  4  . LANTUS SOLOSTAR 100 UNIT/ML Solostar Pen Inject 15 Units into the skin daily at 10 pm.   1  . metFORMIN (GLUCOPHAGE) 500 MG tablet Take 1,000 mg by mouth 2 (two) times daily with a meal.     . pravastatin (PRAVACHOL) 40 MG tablet TAKE ONE TABLET EVERY EVENING 90 tablet 2  . triamcinolone cream (KENALOG) 0.1 % Apply 1 application topically 2 (two) times daily as needed. 30 g 1  . Turmeric 450 MG CAPS Take 450 mg by mouth daily.    . verapamil (CALAN-SR) 240 MG CR tablet TAKE ONE TABLET EACH DAY 90 tablet 2  . furosemide (LASIX) 40 MG tablet Take 40 mg by mouth daily as needed (swelling).     No facility-administered medications prior to visit.      Allergies:   Shellfish-derived products; Codeine sulfate; Morphine and related; Propoxyphene n-acetaminophen; Ramipril; and Shellfish allergy   Social History   Social History  . Marital status: Married    Spouse name: N/A  . Number of children: 3  . Years of education: N/A   Occupational History  . Retired Other   Social History Main Topics  . Smoking status: Former Smoker    Packs/day: 1.00    Years: 57.00    Types: Cigarettes    Quit date: 04/29/2016  . Smokeless tobacco: Never Used  . Alcohol use 0.6 oz/week    1 Shots of liquor per week  . Drug use: No  . Sexual activity: Not Currently   Other Topics Concern  . None   Social History Narrative  . None     Family History:  The patient's family history includes Arthritis in his other; Cancer in his father; Diabetes in his other; Prostate cancer in his brother.   Review of Systems:   Please see the history of present illness.     General:  No chills, fever, night sweats or weight changes.  Cardiovascular:  No chest pain,  dyspnea on exertion, edema, orthopnea, paroxysmal nocturnal dyspnea. Positive for palpitations.  Dermatological: No rash, lesions/masses Respiratory: No cough, dyspnea Urologic: No hematuria, dysuria Abdominal:   No nausea, vomiting, diarrhea, bright red blood per rectum, melena, or hematemesis Neurologic:  No visual changes, wkns, changes in mental status. All other systems reviewed and are otherwise negative except as noted above.   Physical Exam:    VS:  BP 120/60   Pulse 78   Ht 5\' 7"  (1.702 m)   Wt 254 lb (115.2 kg)   BMI 39.78 kg/m    General: Well developed, well nourished,male appearing in no acute distress. Head: Normocephalic, atraumatic, sclera non-icteric, no xanthomas, nares are  without discharge.  Neck: No carotid bruits. JVD not elevated.  Lungs: Respirations regular and unlabored, without wheezes or rales.  Heart: Regular rate and rhythm with no ectopic beats appreciated. No S3 or S4.  No murmur, no rubs, or gallops appreciated. Abdomen: Soft, non-tender, non-distended with normoactive bowel sounds. No hepatomegaly. No rebound/guarding. No obvious abdominal masses. Msk:  Strength and tone appear normal for age. No joint deformities or effusions. Extremities: No clubbing or cyanosis. No lower extremity edema.  Distal pedal pulses are 2+ bilaterally. Neuro: Alert and oriented X 3. Moves all extremities spontaneously. No focal deficits noted. Psych:  Responds to questions appropriately with a normal affect. Skin: No rashes or lesions noted  Wt Readings from Last 3 Encounters:  04/07/17 254 lb (115.2 kg)  03/23/17 253 lb 11.2 oz (115.1 kg)  03/05/17 238 lb (108 kg)     Studies/Labs Reviewed:   EKG:  EKG is ordered today.  The ekg ordered today demonstrates NSR, HR 78, with no acute ST or T-wave changes.   Recent Labs: 05/12/2016: ALT 26 01/12/2017: BUN 13; Creatinine 0.8; Hemoglobin 15.7; Platelets 290; Potassium 4.0; Sodium 140   Lipid Panel    Component Value  Date/Time   CHOL 129 05/12/2016   TRIG 79 05/12/2016   HDL 34 (A) 05/12/2016   CHOLHDL 4 03/29/2015 1055   VLDL 20.4 03/29/2015 1055   LDLCALC 79 05/12/2016   LDLDIRECT 75.0 03/27/2011 1146    Additional studies/ records that were reviewed today include:   Holter Monitor: 10/03/2015 1. NSR with frequent PVC's and PAC's. 2. Ventricular couplets are present 3. No significant brady or tachy arrhythmias  NST: 03/05/2017  Normal perfusion  Nuclear stress EF: 49%.  Blood pressure demonstrated a normal response to exercise.  There was no ST segment deviation noted during stress.  The study is normal.  This is a low risk study.  Assessment:    1. Heart palpitations   2. SVT (supraventricular tachycardia) ([place])   3. Coronary artery disease involving native coronary artery of native heart without angina pectoris   4. Essential hypertension   5. Dyslipidemia      Plan:   In order of problems listed above:  1. Palpitations - He reports new onset palpitations starting yesterday afternoon and spontaneously resolving after several hours. The symptoms represented this morning and remain present at this time. He denies any associated chest discomfort, dyspnea, lightheadedness, dizziness, or presyncope. Describes this as his heart "flipping".  - His EKG and rhythm strip are obtained this afternoon at the time he is still reporting symptoms and shows NSR with no acute changes, PAC's, or PVC's.  - we discussed a repeat monitor but he wishes to avoid this if possible. With his symptoms occurring at the time of his rhythm strip and showing no acute abnormalities, I do not see where a event monitor would be of increased utility. - will check BMET and Mg. TSH recently checked by PCP and normal at that time. Will provide Rx for PRN Lopressor to take up to two times daily as needed for palpitations. If symptoms persist and do not improve, would recommend event monitor at that time.   2.  pSVT - reports his current symptoms do not feel similar to prior episodes of SVT.  - continue Verapamil 240mg  daily.   3. CAD - he has known CAD with 50% LAD stenosis by cath in 2012. - recent NST on 03/05/2017 showed no evidence of ischemia and was overall a low-risk  study. - continue ASA and statin therapy.   4. Essential HTN - BP remains well-controlled at 120/60 during today's visit.  - continue Verapamil 240mg  daily.   5. HLD - followed by PCP.  - Lipid Panel in 2017 showed total cholesterol of 129, HDL 34, and LDL 79. Close to goal of LDL < 70 with known CAD. Remains on Pravastatin 40mg  daily.   Medication Adjustments/Labs and Tests Ordered: Current medicines are reviewed at length with the patient today.  Concerns regarding medicines are outlined above.  Medication changes, Labs and Tests ordered today are listed in the Patient Instructions below. Patient Instructions  Medication Instructions:  START METOPROLOL 25MG  TWICE DAILY AS NEEDED FOR PALPITATIONS  If you need a refill on your cardiac medications before your next appointment, please call your pharmacy.  Labwork: BMP AND MAG TODAY HERE IN OUR OFFICE AT [facility]  Follow-Up: Your physician wants you to follow-up in: [place] ON July 13,2018 @9AM .   Thank you for choosing [facility] at [facility], [address], [place]  04/07/2017 2:53 PM    [address], [place], [place]  [address] Phone: [phone]; Fax: [phone]  [address], [place], [place] [address] Phone: [phone]

## 2017-04-08 ENCOUNTER — Other Ambulatory Visit: Payer: Self-pay | Admitting: Student

## 2017-04-08 ENCOUNTER — Other Ambulatory Visit: Payer: Self-pay | Admitting: Cardiology

## 2017-04-08 ENCOUNTER — Encounter: Payer: Self-pay | Admitting: Student

## 2017-04-08 LAB — BASIC METABOLIC PANEL
BUN/Creatinine Ratio: 13 (ref 10–24)
BUN: 11 mg/dL (ref 8–27)
CALCIUM: 9.4 mg/dL (ref 8.6–10.2)
CHLORIDE: 94 mmol/L — AB (ref 96–106)
CO2: 28 mmol/L (ref 18–29)
CREATININE: 0.83 mg/dL (ref 0.76–1.27)
GFR calc Af Amer: 102 mL/min/{1.73_m2} (ref >59)
GFR calc non Af Amer: 88 mL/min/{1.73_m2} (ref >59)
GLUCOSE: 221 mg/dL — AB (ref 65–99)
Potassium: 3.7 mmol/L (ref 3.5–5.2)
Sodium: 141 mmol/L (ref 134–144)

## 2017-04-08 LAB — MAGNESIUM: MAGNESIUM: 1.5 mg/dL — AB (ref 1.6–2.3)

## 2017-04-08 NOTE — Progress Notes (Unsigned)
mag

## 2017-04-28 DIAGNOSIS — Z6837 Body mass index (BMI) 37.0-37.9, adult: Secondary | ICD-10-CM | POA: Diagnosis not present

## 2017-04-28 DIAGNOSIS — M48062 Spinal stenosis, lumbar region with neurogenic claudication: Secondary | ICD-10-CM | POA: Diagnosis not present

## 2017-04-28 DIAGNOSIS — R030 Elevated blood-pressure reading, without diagnosis of hypertension: Secondary | ICD-10-CM | POA: Diagnosis not present

## 2017-04-28 DIAGNOSIS — M5416 Radiculopathy, lumbar region: Secondary | ICD-10-CM | POA: Diagnosis not present

## 2017-04-28 DIAGNOSIS — M5440 Lumbago with sciatica, unspecified side: Secondary | ICD-10-CM | POA: Diagnosis not present

## 2017-04-28 DIAGNOSIS — M4726 Other spondylosis with radiculopathy, lumbar region: Secondary | ICD-10-CM | POA: Diagnosis not present

## 2017-04-28 DIAGNOSIS — M5136 Other intervertebral disc degeneration, lumbar region: Secondary | ICD-10-CM | POA: Diagnosis not present

## 2017-05-01 NOTE — Progress Notes (Signed)
HPI: FU CAD and SVT. Patient states that he has had atrial fibrillation after surgeries in the past. Cardiac cath in April of 2012 revealed normal LV function, normal pulmonary pressures and pulmonary capillary wedge pressure and nonobstructive coronary disease with the most significant lesion being a 50% LAD. Carotid Dopplers in May of 2013 were normal. Echocardiogram repeated in Jan of 2015. This revealed normal LV function, grade 1 diastolic dysfunction. Abdominal ultrasound October 2016 showed no aneurysm. Patient also with SVT treated with verapamil. Holter monitor November 2016 showed sinus rhythm with frequent PVCs and PACs. There are occasional ventricular couplets. Nuclear study 5/18 showed EF 49 and normal perfusion. Seen 6/18 with palpitations. Since last seen, the patient denies any dyspnea on exertion, orthopnea, PND, pedal edema, palpitations, syncope or chest pain.   Current Outpatient Prescriptions  Medication Sig Dispense Refill  . aspirin 81 MG tablet Take 81 mg by mouth every evening.     . CHROMIUM-CINNAMON PO Take 2,000 mg by mouth daily.    . Cyanocobalamin (VITAMIN B 12 PO) Take 1 tablet by mouth daily.    . diclofenac sodium (VOLTAREN) 1 % GEL Apply 2 g topically 4 (four) times daily. 100 g 1  . finasteride (PROSCAR) 5 MG tablet Take 5 mg by mouth daily.    . furosemide (LASIX) 40 MG tablet Take 40 mg by mouth 2 (two) times daily.    Marland Kitchen gabapentin (NEURONTIN) 300 MG capsule Take 1 capsule (300 mg total) by mouth 3 (three) times daily. 270 capsule 1  . glimepiride (AMARYL) 1 MG tablet Take 1 mg by mouth 2 (two) times daily. Reported on 11/21/2015  4  . LANTUS SOLOSTAR 100 UNIT/ML Solostar Pen Inject 15 Units into the skin daily at 10 pm.   1  . metFORMIN (GLUCOPHAGE) 500 MG tablet Take 1,000 mg by mouth 2 (two) times daily with a meal.     . metoprolol tartrate (LOPRESSOR) 25 MG tablet Take 1 tablet (25 mg total) by mouth 2 (two) times daily. Twice daily as needed for  palpitations 60 tablet 5  . pravastatin (PRAVACHOL) 40 MG tablet TAKE ONE TABLET EVERY EVENING 90 tablet 2  . triamcinolone cream (KENALOG) 0.1 % Apply 1 application topically 2 (two) times daily as needed. 30 g 1  . Turmeric 450 MG CAPS Take 450 mg by mouth daily.    . verapamil (CALAN-SR) 240 MG CR tablet TAKE ONE TABLET EACH DAY 90 tablet 3   No current facility-administered medications for this visit.      Past Medical History:  Diagnosis Date  . Arthritis    "entire spine; both of my hands" (06/04/2016)  . BPH (benign prostatic hypertrophy)   . CAD (coronary artery disease)    a. 50% LAD stenosis by cath in 2012 b. 03/2017: NST showing no evidence of ischemia, overall low-risk.   . Chronic lower back pain   . COPD (chronic obstructive pulmonary disease) ([place]) 05/01/2011   PFT 05/01/11>>FEV1 2.07(72%), FEV1% 65, TLC 6.79(111%), DLCO 69%, no BD  . DYSLIPIDEMIA 11/29/2009  . Dysrhythmia    SVT  . Exposure to TB 9 yrs ago   no active infection  . HYPERTENSION, ESSENTIAL 11/29/2009  . OSA on CPAP 03/26/2011   CPAP 9 cm H2O  . Osteoarthritis   . Pneumonia 1990s  . SVT (supraventricular tachycardia) ([place])   . Type II diabetes mellitus ([place])   . URTICARIA 02/06/2010    Past Surgical History:  Procedure Laterality Date  .  ANTERIOR CERVICAL DECOMP/DISCECTOMY FUSION  06/04/2016   C3-4 and C4-5 anterior cervical decompression and arthrodesis with structural allograft and anterior cervical plating  . ANTERIOR CERVICAL DECOMP/DISCECTOMY FUSION N/A 06/04/2016   Procedure: CERVICAL THREE-FOUR, CERVICAL FOUR-FIVE ANTERIOR CERVICAL DECOMPRESSION/DISCECTOMY FUSION;  Surgeon: Jovita Gamma, MD;  Location: [facility];  Service: Neurosurgery;  Laterality: N/A;  . APPENDECTOMY  1963  . BACK SURGERY    . BILATERAL CARPAL TUNNEL RELEASE Bilateral   . CARDIAC CATHETERIZATION     "[place]"  . CATARACT EXTRACTION W/ INTRAOCULAR LENS  IMPLANT, BILATERAL Bilateral   . JOINT REPLACEMENT    . NASAL  SINUS SURGERY  1989  . POSTERIOR LUMBAR FUSION     L4, L5  . TOTAL KNEE ARTHROPLASTY Left 2007  . TRANSURETHRAL RESECTION OF PROSTATE     bph  . ULNAR TUNNEL RELEASE Right     Social History   Social History  . Marital status: Married    Spouse name: N/A  . Number of children: 3  . Years of education: N/A   Occupational History  . Retired Other   Social History Main Topics  . Smoking status: Current Every Day Smoker    Packs/day: 0.50    Years: 57.00    Types: Cigarettes    Last attempt to quit: 04/29/2016  . Smokeless tobacco: Never Used  . Alcohol use 0.6 oz/week    1 Shots of liquor per week  . Drug use: No  . Sexual activity: Not Currently   Other Topics Concern  . Not on file   Social History Narrative  . No narrative on file    Family History  Problem Relation Age of Onset  . Prostate cancer Brother   . Cancer Father   . Arthritis Other   . Diabetes Other     ROS: no fevers or chills, productive cough, hemoptysis, dysphasia, odynophagia, melena, hematochezia, dysuria, hematuria, rash, seizure activity, orthopnea, PND, pedal edema, claudication. Remaining systems are negative.  Physical Exam: Well-developed well-nourished in no acute distress.  Skin is warm and dry.  HEENT is normal.  Neck is supple.  Chest is clear to auscultation with normal expansion.  Cardiovascular exam is regular rate and rhythm.  Abdominal exam nontender or distended. No masses palpated. Extremities show no edema. neuro grossly intact   A/P  1 Coronary artery disease-continue aspirin and statin. He is not having chest pain. Recent nuclear study showed no ischemia. Plan medical therapy.  2 hypertension-blood pressure is controlled. Continue present medications.  3 hyperlipidemia-continue statin. Lipids and liver monitored by primary care.  4 SVT-symptoms are recently well controlled. Continue verapamil.  5 tobacco abuse-patient counseled on discontinuing.  6 lower  extremity edema-we will continue with present dose of diuretic.  Kirk Ruths, MD

## 2017-05-04 DIAGNOSIS — N400 Enlarged prostate without lower urinary tract symptoms: Secondary | ICD-10-CM | POA: Diagnosis not present

## 2017-05-07 ENCOUNTER — Telehealth: Payer: Self-pay | Admitting: Pulmonary Disease

## 2017-05-07 NOTE — Telephone Encounter (Signed)
Left message for patient to bring CPAP machine with cord or SD card to appointment on 05/08/17. Nothing further is needed.

## 2017-05-08 ENCOUNTER — Encounter: Payer: Self-pay | Admitting: Family Medicine

## 2017-05-08 ENCOUNTER — Ambulatory Visit (INDEPENDENT_AMBULATORY_CARE_PROVIDER_SITE_OTHER): Payer: Medicare Other | Admitting: Pulmonary Disease

## 2017-05-08 ENCOUNTER — Encounter: Payer: Self-pay | Admitting: Pulmonary Disease

## 2017-05-08 ENCOUNTER — Ambulatory Visit (INDEPENDENT_AMBULATORY_CARE_PROVIDER_SITE_OTHER): Payer: Medicare Other | Admitting: Family Medicine

## 2017-05-08 VITALS — BP 122/74 | HR 87 | Ht 67.0 in | Wt 251.0 lb

## 2017-05-08 VITALS — BP 110/70 | HR 98 | Temp 98.3°F | Wt 253.9 lb

## 2017-05-08 DIAGNOSIS — G4733 Obstructive sleep apnea (adult) (pediatric): Secondary | ICD-10-CM

## 2017-05-08 DIAGNOSIS — Z720 Tobacco use: Secondary | ICD-10-CM

## 2017-05-08 DIAGNOSIS — M1812 Unilateral primary osteoarthritis of first carpometacarpal joint, left hand: Secondary | ICD-10-CM

## 2017-05-08 DIAGNOSIS — J439 Emphysema, unspecified: Secondary | ICD-10-CM

## 2017-05-08 DIAGNOSIS — I2510 Atherosclerotic heart disease of native coronary artery without angina pectoris: Secondary | ICD-10-CM

## 2017-05-08 MED ORDER — DICLOFENAC SODIUM 1 % TD GEL: 2.0000 g | Freq: Four times a day (QID) | TRANSDERMAL | 1 refills | Status: DC

## 2017-05-08 NOTE — Progress Notes (Signed)
Current Outpatient Prescriptions on File Prior to Visit  Medication Sig  . aspirin 81 MG tablet Take 81 mg by mouth every evening.   . CHROMIUM-CINNAMON PO Take 2,000 mg by mouth daily.  . Cyanocobalamin (VITAMIN B 12 PO) Take 1 tablet by mouth daily.  . finasteride (PROSCAR) 5 MG tablet Take 5 mg by mouth daily.  . furosemide (LASIX) 40 MG tablet Take 40 mg by mouth 2 (two) times daily.  Marland Kitchen gabapentin (NEURONTIN) 300 MG capsule Take 1 capsule (300 mg total) by mouth 3 (three) times daily.  Marland Kitchen glimepiride (AMARYL) 1 MG tablet Take 1 mg by mouth 2 (two) times daily. Reported on 11/21/2015  . LANTUS SOLOSTAR 100 UNIT/ML Solostar Pen Inject 15 Units into the skin daily at 10 pm.   . metFORMIN (GLUCOPHAGE) 500 MG tablet Take 1,000 mg by mouth 2 (two) times daily with a meal.   . metoprolol tartrate (LOPRESSOR) 25 MG tablet Take 1 tablet (25 mg total) by mouth 2 (two) times daily. Twice daily as needed for palpitations  . pravastatin (PRAVACHOL) 40 MG tablet TAKE ONE TABLET EVERY EVENING  . triamcinolone cream (KENALOG) 0.1 % Apply 1 application topically 2 (two) times daily as needed.  . Turmeric 450 MG CAPS Take 450 mg by mouth daily.  . verapamil (CALAN-SR) 240 MG CR tablet TAKE ONE TABLET EACH DAY   No current facility-administered medications on file prior to visit.      Chief Complaint  Patient presents with  . Follow-up    Pt states he is not currently using his cpap machine, he states he was having trouble with the mask coming off at night, he had no trouble with the pressure when he was on it, pt states he has no conerns about his sleep as of today     Sleep tests PSG 03/26/11>>AHI 34.5, SpO2 low 78%.  CPAP 9 cm>>AHI 8, +REM/supine CPAP 10/16/11 to 12/04/11>>Used on 50 of 50 nights with average 4 hrs 12 min.  Average AHI 3.8 with CPAP 12 cm H2O  Pulmonary tests PFT 05/01/11>>FEV1 2.07(72%), FEV1% 65, TLC 6.79(111%), RV 3.42(146%), DLCO 69%, no BD  Cardiac tests Echo 11/08/13 >> EF  55 to 60%, grade 1 DD  Past medical history  Past surgical history, Family history, Social history, Allergies all reviewed.  Vital Signs BP 122/74 (BP Location: Right Arm, Patient Position: Sitting, Cuff Size: Normal)   Pulse 87   Ht 5\' 7"  (1.702 m)   Wt 251 lb (113.9 kg)   SpO2 97%   BMI 39.31 kg/m   History of Present Illness Steve Charles is a 71 y.o. male smoker with OSA and Emphysema.  I last saw him in 2014.  He has lost about 60 lbs since he was started on CPAP.  He is sleeping better, and hasn't used CPAP in almost 1 year.  He is not snoring, and doesn't feel like he has any issues with his sleep.  He still smokes 1/2 ppd.  He is slowly decreasing.  He doesn't feel like he has any trouble with his breathing.  He denies cough, wheeze, sputum.  He walks 3 miles per day w/o difficulty.  He had low dose CT chest for lung cancer screening in April 2018 >> mild centrilobular emphysema, 5 mm subpleural nodule LLL.    Physical Exam  General - No distress ENT - No sinus tenderness, no oral exudate, no LAN Cardiac - s1s2 regular, no murmur Chest - No wheeze/rales/dullness Back - No  focal tenderness Abd - Soft, non-tender Ext - No edema Neuro - Normal strength Skin - No rashes Psych - normal mood, and behavior   Assessment/Plan  Obstructive sleep apnea. - improved with weight loss - monitor off CPAP  Lung nodule. - he has follow up CT chest in April 2019 with Eric Form as part of lung cancer screening program  Emphysema with tobacco abuse. - discussed importance of smoking cessation - he will continue trying to quit smoking on his own   Patient Instructions  Follow up as needed    Chesley Mires, MD [facility] Pager:  [phone] 05/08/2017, 11:09 AM

## 2017-05-08 NOTE — Patient Instructions (Signed)
Check on coverage for new shingles vaccine (Shingrix)- if interested. 

## 2017-05-08 NOTE — Progress Notes (Signed)
Subjective:     Patient ID: Steve Charles, male   DOB: [birth year], 71 y.o.   MRN: [id number]  HPI Patient is seen with left hand pain. On further questioning, he really has pain involving the thumb CMC and MCP joint. No injury. He states he is "ambidextrous ". He haad seen orthopedist years ago and apparently had steroid injections at that time which provided some temporary relief. He's taken some Tylenol and Ibuprofen without much relief. Worsening pain past few days. No injury. Also has some recent progressive left wrist pain.  Past Medical History:  Diagnosis Date  . Arthritis    "entire spine; both of my hands" (06/04/2016)  . BPH (benign prostatic hypertrophy)   . CAD (coronary artery disease)    a. 50% LAD stenosis by cath in 2012 b. 03/2017: NST showing no evidence of ischemia, overall low-risk.   . Chronic lower back pain   . COPD (chronic obstructive pulmonary disease) ([place]) 05/01/2011   PFT 05/01/11>>FEV1 2.07(72%), FEV1% 65, TLC 6.79(111%), DLCO 69%, no BD  . DYSLIPIDEMIA 11/29/2009  . Dysrhythmia    SVT  . Exposure to TB 9 yrs ago   no active infection  . HYPERTENSION, ESSENTIAL 11/29/2009  . OSA on CPAP 03/26/2011   CPAP 9 cm H2O  . Osteoarthritis   . Pneumonia 1990s  . SVT (supraventricular tachycardia) ([place])   . Type II diabetes mellitus ([place])   . URTICARIA 02/06/2010   Past Surgical History:  Procedure Laterality Date  . ANTERIOR CERVICAL DECOMP/DISCECTOMY FUSION  06/04/2016   C3-4 and C4-5 anterior cervical decompression and arthrodesis with structural allograft and anterior cervical plating  . ANTERIOR CERVICAL DECOMP/DISCECTOMY FUSION N/A 06/04/2016   Procedure: CERVICAL THREE-FOUR, CERVICAL FOUR-FIVE ANTERIOR CERVICAL DECOMPRESSION/DISCECTOMY FUSION;  Surgeon: Jovita Gamma, MD;  Location: [facility];  Service: Neurosurgery;  Laterality: N/A;  . APPENDECTOMY  1963  . BACK SURGERY    . BILATERAL CARPAL TUNNEL RELEASE Bilateral   . CARDIAC CATHETERIZATION     "[place]"  . CATARACT EXTRACTION W/ INTRAOCULAR LENS  IMPLANT, BILATERAL Bilateral   . JOINT REPLACEMENT    . NASAL SINUS SURGERY  1989  . POSTERIOR LUMBAR FUSION     L4, L5  . TOTAL KNEE ARTHROPLASTY Left 2007  . TRANSURETHRAL RESECTION OF PROSTATE     bph  . ULNAR TUNNEL RELEASE Right     reports that he has been smoking Cigarettes.  He has a 28.50 pack-year smoking history. He has never used smokeless tobacco. He reports that he drinks about 0.6 oz of alcohol per week . He reports that he does not use drugs. family history includes Arthritis in his other; Cancer in his father; Diabetes in his other; Prostate cancer in his brother. Allergies  Allergen Reactions  . Shellfish-Derived Products Hives    hives  . Codeine Sulfate Hives  . Morphine And Related Hives    Oral morphine/ hives   . Propoxyphene N-Acetaminophen Hives  . Ramipril Hives  . Shellfish Allergy Hives     Review of Systems  Musculoskeletal: Positive for arthralgias.       Objective:   Physical Exam  Constitutional: He appears well-developed and well-nourished.  Cardiovascular: Normal rate and regular rhythm.   Pulmonary/Chest: Effort normal and breath sounds normal. No respiratory distress. He has no wheezes. He has no rales.  Musculoskeletal:  Left hand no erythema or warmth. No ecchymosis. He has tenderness in palpating over the [facility] and MCP joint. Good range of motion.  Assessment:     Probable primary osteoarthritis left thumb involving CMC and MCP joints    Plan:     -Set up referral to hand surgeon per patient request -Trial diclofenac 1% gel to use 3-4 times daily as needed -avoid regular use of oral NSAIDS given his age and diabetes hx.  [name] MD  Primary Care at [facility]

## 2017-05-08 NOTE — Patient Instructions (Signed)
Follow up as needed

## 2017-05-11 ENCOUNTER — Telehealth: Payer: Self-pay

## 2017-05-11 NOTE — Telephone Encounter (Signed)
Received PA request for Diclofenac Sodium. PA submitted & pending. Key: [id number]

## 2017-05-13 DIAGNOSIS — R310 Gross hematuria: Secondary | ICD-10-CM | POA: Diagnosis not present

## 2017-05-15 ENCOUNTER — Encounter: Payer: Self-pay | Admitting: Cardiology

## 2017-05-15 ENCOUNTER — Ambulatory Visit (INDEPENDENT_AMBULATORY_CARE_PROVIDER_SITE_OTHER): Payer: Medicare Other | Admitting: Cardiology

## 2017-05-15 VITALS — BP 114/66 | HR 84 | Ht 67.0 in | Wt 255.0 lb

## 2017-05-15 DIAGNOSIS — E7800 Pure hypercholesterolemia, unspecified: Secondary | ICD-10-CM

## 2017-05-15 DIAGNOSIS — Z720 Tobacco use: Secondary | ICD-10-CM | POA: Diagnosis not present

## 2017-05-15 DIAGNOSIS — I471 Supraventricular tachycardia: Secondary | ICD-10-CM

## 2017-05-15 DIAGNOSIS — I2510 Atherosclerotic heart disease of native coronary artery without angina pectoris: Secondary | ICD-10-CM | POA: Diagnosis not present

## 2017-05-15 DIAGNOSIS — I10 Essential (primary) hypertension: Secondary | ICD-10-CM

## 2017-05-15 NOTE — Patient Instructions (Signed)
Your physician wants you to follow-up in: 6 MONTHS WITH DR CRENSHAW You will receive a reminder letter in the mail two months in advance. If you don't receive a letter, please call our office to schedule the follow-up appointment.   If you need a refill on your cardiac medications before your next appointment, please call your pharmacy.  

## 2017-05-15 NOTE — Telephone Encounter (Signed)
PA approved, form faxed back to pharmacy. 

## 2017-05-20 DIAGNOSIS — M1812 Unilateral primary osteoarthritis of first carpometacarpal joint, left hand: Secondary | ICD-10-CM | POA: Diagnosis not present

## 2017-06-01 DIAGNOSIS — R609 Edema, unspecified: Secondary | ICD-10-CM | POA: Diagnosis not present

## 2017-06-01 DIAGNOSIS — I10 Essential (primary) hypertension: Secondary | ICD-10-CM | POA: Diagnosis not present

## 2017-06-01 DIAGNOSIS — E1165 Type 2 diabetes mellitus with hyperglycemia: Secondary | ICD-10-CM | POA: Diagnosis not present

## 2017-06-01 DIAGNOSIS — E7800 Pure hypercholesterolemia, unspecified: Secondary | ICD-10-CM | POA: Diagnosis not present

## 2017-06-04 ENCOUNTER — Ambulatory Visit: Payer: Medicare Other | Admitting: *Deleted

## 2017-06-12 ENCOUNTER — Encounter: Payer: Medicare Other | Attending: Family Medicine | Admitting: *Deleted

## 2017-06-12 DIAGNOSIS — E118 Type 2 diabetes mellitus with unspecified complications: Secondary | ICD-10-CM

## 2017-06-12 DIAGNOSIS — Z713 Dietary counseling and surveillance: Secondary | ICD-10-CM | POA: Diagnosis not present

## 2017-06-12 DIAGNOSIS — E119 Type 2 diabetes mellitus without complications: Secondary | ICD-10-CM | POA: Diagnosis not present

## 2017-06-12 NOTE — Progress Notes (Signed)
  Medical Nutrition Therapy: 06/12/2017  Appt start time:  1100    end time:  1130  Assessment:  Primary concerns today: patient here for follow up diabetes education. Patient states his last A1c was 7.4 % He continues to exercise almost every day and has also started walking in the pool. He continues to check his BG daily in the AM with reported range of 77-130 mg/dl most day. He is taking his Lantus every night as prescribed and adjusts the dose when he gets a steroid injection. He wanted to know today what I thought of Weight Watchers as a method for weight loss. Tanita scale indicates a weight gain of 6 pounds since his last visit.    TANITA  BODY COMP RESULTS on 07/21/13  Weight 255.5 lb   BMI (kg/m^2) 38.8   Fat Mass (lbs) 97.5 lb   Fat Free Mass (lbs) 158.0 lb   Total Body Water (lbs) 115.5 lb    TANITA  BODY COMP RESULTS on 01/20/14  Weight 248.5 lb   BMI (kg/m^2) 37.8   Fat Mass (lbs) 96.0 lb   Fat Free Mass (lbs) 152.5 lb   Total Body Water (lbs) 111.5 lb    TANITA  BODY COMP RESULTS 12/07/2014   Weight 258 lb   BMI (kg/m^2) 39.2   Fat Mass (lbs) 86.5 lb   Fat Free Mass (lbs) 171.5 lb   Total Body Water (lbs) 125.5 lb     TANITA  BODY COMP RESULTS 12/06/15  Weight (lbs) 237.5   BMI (kg/m^2) 36.1   Fat Mass (lbs) 89.0   Fat Free Mass (lbs) 148.5   Total Body Water (lbs) 108.5    TANITA  BODY COMP RESULTS 01/22/2017  Weight (lbs) 245.2    BMI (kg/m^2) 37.3   Fat Mass (lbs) 104.8   Fat Free Mass (lbs) 140.4   Total Body Water (lbs) 44.0    TANITA  BODY COMP RESULTS 06/12/2017  Weight (lbs) 251.6   BMI (kg/m^2) 46.0   Fat Mass (lbs) 130.6   Fat Free Mass (lbs) 121.0   Total Body Water (lbs) NA    MEDICATIONS: see list, Diabetes medications are now Amaryl, Metformin and Lantus insulin  Usual physical activity: water exercises several days a week    Intervention: Acknowledged his continued success with controlling his BG effectively and continuing to work on [employer]  management I explained that Weight Watchers can be a good support system and that with his knowledge of Carb Counting and rationale of spreading his carb containing foods fairly evenly throughout the day, he would be able to manage their point system accordingly if he was interested.   Plan:  Continue to aim for 3 Carb Choices per meal (45 grams) +/- 1 either way  Continue to aim for 0-2 Carbs per snack if hungry  Continue reading food labels for Total Carbohydrate and Fat Grams of foods Continue with your activity level daily as tolerated Continue checking BG at alternate times per day on occasion Continue taking Lantus at night as directed by your MD    Handouts given during visit include: Tanita Scale results handout  Monitoring/Evaluation:  Dietary intake, exercise, reading food labels, and body weight in 3 months.

## 2017-06-16 ENCOUNTER — Other Ambulatory Visit: Payer: Self-pay | Admitting: Cardiology

## 2017-08-13 ENCOUNTER — Ambulatory Visit: Payer: Medicare Other

## 2017-08-13 ENCOUNTER — Ambulatory Visit (INDEPENDENT_AMBULATORY_CARE_PROVIDER_SITE_OTHER): Payer: Medicare Other

## 2017-08-13 DIAGNOSIS — Z23 Encounter for immunization: Secondary | ICD-10-CM

## 2017-08-25 ENCOUNTER — Encounter: Payer: Self-pay | Admitting: Cardiology

## 2017-08-26 ENCOUNTER — Ambulatory Visit (INDEPENDENT_AMBULATORY_CARE_PROVIDER_SITE_OTHER): Payer: Medicare Other | Admitting: Family Medicine

## 2017-08-26 ENCOUNTER — Encounter: Payer: Self-pay | Admitting: Family Medicine

## 2017-08-26 VITALS — BP 102/62 | HR 102 | Temp 98.4°F | Wt 254.5 lb

## 2017-08-26 DIAGNOSIS — I2510 Atherosclerotic heart disease of native coronary artery without angina pectoris: Secondary | ICD-10-CM

## 2017-08-26 DIAGNOSIS — R21 Rash and other nonspecific skin eruption: Secondary | ICD-10-CM

## 2017-08-26 MED ORDER — NYSTATIN 100000 UNIT/GM EX CREA: 1.0000 "application " | TOPICAL_CREAM | Freq: Two times a day (BID) | CUTANEOUS | 2 refills | Status: AC

## 2017-08-26 MED ORDER — FLUCONAZOLE 100 MG PO TABS: 100.0000 mg | ORAL_TABLET | Freq: Every day | ORAL | 0 refills | Status: DC

## 2017-08-26 NOTE — Progress Notes (Signed)
Subjective:     Patient ID: Steve Charles, male   DOB: 1946/03/23, 71 y.o.   MRN: [id number]  HPI Patient seen with persistent rash which has been plaguing hime off and on for 2 years. This started out mostly between the buttocks but now spread to the groin region and also he has a fold of skin under her pannus from where his abdominal skin hangs down. Itching is worse at night. He's been using triamcinolone cream without improvement. He has type 2 diabetes. Has not tried any recent antifungals.  Past Medical History:  Diagnosis Date  . Arthritis    "entire spine; both of my hands" (06/04/2016)  . BPH (benign prostatic hypertrophy)   . CAD (coronary artery disease)    a. 50% LAD stenosis by cath in 2012 b. 03/2017: NST showing no evidence of ischemia, overall low-risk.   . Chronic lower back pain   . COPD (chronic obstructive pulmonary disease) ([place]) 05/01/2011   PFT 05/01/11>>FEV1 2.07(72%), FEV1% 65, TLC 6.79(111%), DLCO 69%, no BD  . DYSLIPIDEMIA 11/29/2009  . Dysrhythmia    SVT  . Exposure to TB 9 yrs ago   no active infection  . HYPERTENSION, ESSENTIAL 11/29/2009  . OSA on CPAP 03/26/2011   CPAP 9 cm H2O  . Osteoarthritis   . Pneumonia 1990s  . SVT (supraventricular tachycardia) ([place])   . Type II diabetes mellitus ([place])   . URTICARIA 02/06/2010   Past Surgical History:  Procedure Laterality Date  . ANTERIOR CERVICAL DECOMP/DISCECTOMY FUSION  06/04/2016   C3-4 and C4-5 anterior cervical decompression and arthrodesis with structural allograft and anterior cervical plating  . ANTERIOR CERVICAL DECOMP/DISCECTOMY FUSION N/A 06/04/2016   Procedure: CERVICAL THREE-FOUR, CERVICAL FOUR-FIVE ANTERIOR CERVICAL DECOMPRESSION/DISCECTOMY FUSION;  Surgeon: Jovita Gamma, MD;  Location: [facility];  Service: Neurosurgery;  Laterality: N/A;  . APPENDECTOMY  1963  . BACK SURGERY    . BILATERAL CARPAL TUNNEL RELEASE Bilateral   . CARDIAC CATHETERIZATION     "[place]"  . CATARACT EXTRACTION  W/ INTRAOCULAR LENS  IMPLANT, BILATERAL Bilateral   . JOINT REPLACEMENT    . NASAL SINUS SURGERY  1989  . POSTERIOR LUMBAR FUSION     L4, L5  . TOTAL KNEE ARTHROPLASTY Left 2007  . TRANSURETHRAL RESECTION OF PROSTATE     bph  . ULNAR TUNNEL RELEASE Right     reports that he has been smoking Cigarettes.  He has a 28.50 pack-year smoking history. He has never used smokeless tobacco. He reports that he drinks about 0.6 oz of alcohol per week . He reports that he does not use drugs. family history includes Arthritis in his other; Cancer in his father; Diabetes in his other; Prostate cancer in his brother. Allergies  Allergen Reactions  . Shellfish-Derived Products Hives    hives  . Codeine Sulfate Hives  . Morphine And Related Hives    Oral morphine/ hives   . Propoxyphene N-Acetaminophen Hives  . Ramipril Hives  . Shellfish Allergy Hives     Review of Systems  Skin: Positive for rash.       Objective:   Physical Exam  Constitutional: He appears well-developed and well-nourished.  Cardiovascular: Normal rate.   Pulmonary/Chest: Effort normal and breath sounds normal. No respiratory distress. He has no wheezes. He has no rales.  Skin: Rash noted.  Patient has erythematous rash groin region bilaterally and this spreads into the perineum and between the buttocks. He has some excoriations on the buttock region. He  has similar erythema at the skin fold underneath abdominal pannus       Assessment:     Skin rash which is confined to moist areas as above and suspect Candida    Plan:     -Leave off steroid cream -Keep dry as possible -Nystatin cream twice daily -Fluconazole 100 mg once daily for 7 days -Reassess 2 weeks  [name] MD [facility] at [facility]

## 2017-08-26 NOTE — Patient Instructions (Signed)
Keep areas involved dry as possible. Let's plan on follow up in 2 weeks.

## 2017-09-01 ENCOUNTER — Other Ambulatory Visit: Payer: Self-pay | Admitting: Family Medicine

## 2017-09-09 ENCOUNTER — Encounter: Payer: Self-pay | Admitting: Family Medicine

## 2017-09-09 ENCOUNTER — Ambulatory Visit (INDEPENDENT_AMBULATORY_CARE_PROVIDER_SITE_OTHER): Payer: Medicare Other | Admitting: Family Medicine

## 2017-09-09 VITALS — BP 120/70 | HR 85 | Temp 98.3°F | Wt 255.3 lb

## 2017-09-09 DIAGNOSIS — I2510 Atherosclerotic heart disease of native coronary artery without angina pectoris: Secondary | ICD-10-CM

## 2017-09-09 DIAGNOSIS — B356 Tinea cruris: Secondary | ICD-10-CM | POA: Diagnosis not present

## 2017-09-09 NOTE — Progress Notes (Signed)
Subjective:     Patient ID: Steve Charles, male   DOB: [birth year], 71 y.o.   MRN: [id number]  HPI Follow-up regarding recent skin rash. He had involvement between the buttock region predominantly but also somewhat groin. Suspected tinea cruris. We gave him some topical nystatin cream and also fluconazole 100 mg daily for 7 days. His rash is completely clear at this time. He is very pleased overall.  Past Medical History:  Diagnosis Date  . Arthritis    "entire spine; both of my hands" (06/04/2016)  . BPH (benign prostatic hypertrophy)   . CAD (coronary artery disease)    a. 50% LAD stenosis by cath in 2012 b. 03/2017: NST showing no evidence of ischemia, overall low-risk.   . Chronic lower back pain   . COPD (chronic obstructive pulmonary disease) ([place]) 05/01/2011   PFT 05/01/11>>FEV1 2.07(72%), FEV1% 65, TLC 6.79(111%), DLCO 69%, no BD  . DYSLIPIDEMIA 11/29/2009  . Dysrhythmia    SVT  . Exposure to TB 9 yrs ago   no active infection  . HYPERTENSION, ESSENTIAL 11/29/2009  . OSA on CPAP 03/26/2011   CPAP 9 cm H2O  . Osteoarthritis   . Pneumonia 1990s  . SVT (supraventricular tachycardia) ([place])   . Type II diabetes mellitus ([place])   . URTICARIA 02/06/2010   Past Surgical History:  Procedure Laterality Date  . ANTERIOR CERVICAL DECOMP/DISCECTOMY FUSION  06/04/2016   C3-4 and C4-5 anterior cervical decompression and arthrodesis with structural allograft and anterior cervical plating  . APPENDECTOMY  1963  . BACK SURGERY    . BILATERAL CARPAL TUNNEL RELEASE Bilateral   . CARDIAC CATHETERIZATION     "[place]"  . CATARACT EXTRACTION W/ INTRAOCULAR LENS  IMPLANT, BILATERAL Bilateral   . JOINT REPLACEMENT    . NASAL SINUS SURGERY  1989  . POSTERIOR LUMBAR FUSION     L4, L5  . TOTAL KNEE ARTHROPLASTY Left 2007  . TRANSURETHRAL RESECTION OF PROSTATE     bph  . ULNAR TUNNEL RELEASE Right     reports that he has been smoking cigarettes.  He has a 28.50 pack-year smoking history. he  has never used smokeless tobacco. He reports that he drinks about 0.6 oz of alcohol per week. He reports that he does not use drugs. family history includes Arthritis in his other; Cancer in his father; Diabetes in his other; Prostate cancer in his brother. Allergies  Allergen Reactions  . Shellfish-Derived Products Hives    hives  . Codeine Sulfate Hives  . Morphine And Related Hives    Oral morphine/ hives   . Propoxyphene N-Acetaminophen Hives  . Ramipril Hives  . Shellfish Allergy Hives     Review of Systems  Constitutional: Negative for chills and fever.  Skin: Negative for rash.       Objective:   Physical Exam  Constitutional: He appears well-developed and well-nourished.  Cardiovascular: Normal rate and regular rhythm.  Skin:  Rash from last visit has essentially resolved       Assessment:     -Tinea cruris resolved with antifungal treatment    Plan:     -Keep area dry as possible -Touch base for any recurrence  [name] MD [facility] at [facility]

## 2017-09-17 DIAGNOSIS — E1142 Type 2 diabetes mellitus with diabetic polyneuropathy: Secondary | ICD-10-CM | POA: Diagnosis not present

## 2017-09-17 DIAGNOSIS — I872 Venous insufficiency (chronic) (peripheral): Secondary | ICD-10-CM | POA: Diagnosis not present

## 2017-09-17 DIAGNOSIS — M2022 Hallux rigidus, left foot: Secondary | ICD-10-CM | POA: Diagnosis not present

## 2017-09-29 DIAGNOSIS — E1165 Type 2 diabetes mellitus with hyperglycemia: Secondary | ICD-10-CM | POA: Diagnosis not present

## 2017-09-29 DIAGNOSIS — D751 Secondary polycythemia: Secondary | ICD-10-CM | POA: Diagnosis not present

## 2017-09-29 DIAGNOSIS — I10 Essential (primary) hypertension: Secondary | ICD-10-CM | POA: Diagnosis not present

## 2017-09-29 DIAGNOSIS — R609 Edema, unspecified: Secondary | ICD-10-CM | POA: Diagnosis not present

## 2017-10-06 DIAGNOSIS — E1165 Type 2 diabetes mellitus with hyperglycemia: Secondary | ICD-10-CM | POA: Diagnosis not present

## 2017-10-06 DIAGNOSIS — I10 Essential (primary) hypertension: Secondary | ICD-10-CM | POA: Diagnosis not present

## 2017-10-06 DIAGNOSIS — R609 Edema, unspecified: Secondary | ICD-10-CM | POA: Diagnosis not present

## 2017-10-06 DIAGNOSIS — E669 Obesity, unspecified: Secondary | ICD-10-CM | POA: Diagnosis not present

## 2017-10-06 DIAGNOSIS — D751 Secondary polycythemia: Secondary | ICD-10-CM | POA: Diagnosis not present

## 2017-10-06 DIAGNOSIS — E7800 Pure hypercholesterolemia, unspecified: Secondary | ICD-10-CM | POA: Diagnosis not present

## 2017-10-06 DIAGNOSIS — E1140 Type 2 diabetes mellitus with diabetic neuropathy, unspecified: Secondary | ICD-10-CM | POA: Diagnosis not present

## 2017-10-07 ENCOUNTER — Encounter: Payer: Medicare Other | Attending: Endocrinology | Admitting: *Deleted

## 2017-10-07 DIAGNOSIS — E1140 Type 2 diabetes mellitus with diabetic neuropathy, unspecified: Secondary | ICD-10-CM | POA: Insufficient documentation

## 2017-10-07 DIAGNOSIS — E118 Type 2 diabetes mellitus with unspecified complications: Secondary | ICD-10-CM

## 2017-10-07 DIAGNOSIS — Z794 Long term (current) use of insulin: Secondary | ICD-10-CM | POA: Diagnosis not present

## 2017-10-07 DIAGNOSIS — E1165 Type 2 diabetes mellitus with hyperglycemia: Secondary | ICD-10-CM | POA: Insufficient documentation

## 2017-10-07 NOTE — Patient Instructions (Signed)
Plan:  Continue to aim for 3 Carb Choices per meal (45 grams) +/- 1 either way  Continue to aim for 0-2 Carbs per snack if hungry  Continue reading food labels for Total Carbohydrate and Fat Grams of foods Continue with your activity level daily as tolerated Continue checking BG at alternate times per day on occasion Continue taking Lantus at night as directed by your MD

## 2017-10-07 NOTE — Progress Notes (Signed)
  Medical Nutrition Therapy: 10/07/2017  Appt start time:  1100    end time:  1130  Assessment:  Primary concerns today: patient here for follow up diabetes education. He is here today with his wife, Denny Peon, who participated in the visit. Patient states his last A1c was 7.0 % He continues to exercise almost every day and has also started walking in the pool. He continues to check his BG daily in the AM . He is taking his Lantus every night as prescribed and adjusts the dose if needed. He is pleased with his most recent A1c of 7.0% but is concerned over weight gain. He does acknowledge that muscle weighs more than fat. WE will review the Tanita Scale results today.   TANITA  BODY COMP RESULTS on 07/21/13  Weight 255.5 lb   BMI (kg/m^2) 38.8   Fat Mass (lbs) 97.5 lb   Fat Free Mass (lbs) 158.0 lb   Total Body Water (lbs) 115.5 lb    TANITA  BODY COMP RESULTS on 01/20/14  Weight 248.5 lb   BMI (kg/m^2) 37.8   Fat Mass (lbs) 96.0 lb   Fat Free Mass (lbs) 152.5 lb   Total Body Water (lbs) 111.5 lb    TANITA  BODY COMP RESULTS 12/07/2014   Weight 258 lb   BMI (kg/m^2) 39.2   Fat Mass (lbs) 86.5 lb   Fat Free Mass (lbs) 171.5 lb   Total Body Water (lbs) 125.5 lb     TANITA  BODY COMP RESULTS 12/06/15  Weight (lbs) 237.5   BMI (kg/m^2) 36.1   Fat Mass (lbs) 89.0   Fat Free Mass (lbs) 148.5   Total Body Water (lbs) 108.5    TANITA  BODY COMP RESULTS 01/22/2017  Weight (lbs) 245.2    BMI (kg/m^2) 37.3   Fat Mass (lbs) 104.8   Fat Free Mass (lbs) 140.4   Total Body Water (%) 107.8    TANITA  BODY COMP RESULTS 06/12/2017  Weight (lbs) 251.6   BMI (kg/m^2) 46.0   Fat Mass (lbs) 130.6   Fat Free Mass (lbs) 121.0   Total Body Water (lbs) NA   TANITA  BODY COMP RESULTS 10/07/2017  Weight (lbs)  257.8 lb   BMI (kg/m^2) 40.4   Fat Mass (lbs) 101.0   Fat Free Mass (lbs) 156.8   Total Body Water (lbs) 117.2   MEDICATIONS: see list, Diabetes medications are now Amaryl, Metformin and Lantus  insulin  Usual physical activity: water exercises several days a week    Intervention: Acknowledged his continued success with controlling his BG effectively. Also discussed his increase in Fat Free Mass and decrease in Fat Mass indicating further improved fitness level.  With his knowledge of Carb Counting and rationale of spreading his carb containing foods fairly evenly throughout the day, he appears to be managing his diabetes and health very well.    Plan:  Continue to aim for 3 Carb Choices per meal (45 grams) +/- 1 either way  Continue to aim for 0-2 Carbs per snack if hungry  Continue reading food labels for Total Carbohydrate and Fat Grams of foods Continue with your activity level daily as tolerated Continue checking BG at alternate times per day on occasion Continue taking Lantus at night as directed by your MD    Handouts given during visit include: Tanita Scale results handout  Monitoring/Evaluation:  Dietary intake, exercise, reading food labels, and body weight in 4 months.

## 2017-10-20 NOTE — Progress Notes (Addendum)
Subjective:   Steve Charles is a 71 y.o. male who presents for Medicare Annual/Subsequent preventive examination.  Lives with spouse;  3 children; one here and one is [place] and one [place]  A1c 01/2017 but sees Dr. Chalmers Cater;   Diet Is currently seeing a dietician  A1c 7.1  Is not as hungry  100 carbs instead of recommended 140 Eats more protein if he is hungry   BMI 43  Working with a dietician Noted that weight is muscle and his body fat is 20% less   Exercise Had a uneventful recovery  Pool x 3 days a week Walk 3 and 4 miles  60 minutes x 5 days a week and 180 min x 2 of those days Walks in the am   There are no preventive care reminders to display for this patient.    Goes a dietician Seen Dr Chalmers Cater x 2 weeks ago 12/4  A1c was 7 at Dr. Almetta Lovely; note was sent but not recorded The patient was a good historian  She does a foot exam every time she sees him as well as he seen a podiatrist at the end of November   Microalbumin   Trying to downsize now Thinking of moving to apt or other as they are tired of upkeep  Can't afford retirement communities  Children have an in-law suite when and if they need it  They live in [place]  Has a good relationship with children in [place] Good lifelong neighbors now    There are no preventive care reminders to display for this patient.     Objective:    Vitals: BP 100/60   Ht 5\' 7"  (1.702 m)   Wt 257 lb (116.6 kg)   BMI 40.25 kg/m   Body mass index is 40.25 kg/m.  Advanced Directives 10/21/2017 07/11/2016 06/04/2016 05/29/2016 04/21/2016 12/10/2015 11/26/2015  Does Patient Have a Medical Advance Directive? No Yes;No No No Yes No No  Type of Advance Directive - - - - Press photographer;Living will - -  Does patient want to make changes to medical advance directive? - Yes - information given - - - - -  Would patient like information on creating a medical advance directive? - - No - patient declined information Yes -  Scientist, clinical (histocompatibility and immunogenetics) given - - Yes - Educational materials given    Tobacco Social History   Tobacco Use  Smoking Status Current Every Day Smoker  . Packs/day: 0.50  . Years: 57.00  . Pack years: 28.50  . Types: Cigarettes  . Last attempt to quit: 04/29/2016  . Years since quitting: 1.4  Smokeless Tobacco Never Used     Ready to quit: Not Answered Counseling given: Yes   Tobacco quit 04/2016 p 55 years - Eric Form NP following with LDCT program; now smoking again  Had AAA 2016 Stress test went well; states he prepared for it    Past Medical History:  Diagnosis Date  . Arthritis    "entire spine; both of my hands" (06/04/2016)  . BPH (benign prostatic hypertrophy)   . CAD (coronary artery disease)    a. 50% LAD stenosis by cath in 2012 b. 03/2017: NST showing no evidence of ischemia, overall low-risk.   . Chronic lower back pain   . COPD (chronic obstructive pulmonary disease) ([place]) 05/01/2011   PFT 05/01/11>>FEV1 2.07(72%), FEV1% 65, TLC 6.79(111%), DLCO 69%, no BD  . DYSLIPIDEMIA 11/29/2009  . Dysrhythmia    SVT  . Exposure to  TB 9 yrs ago   no active infection  . HYPERTENSION, ESSENTIAL 11/29/2009  . OSA on CPAP 03/26/2011   CPAP 9 cm H2O  . Osteoarthritis   . Pneumonia 1990s  . SVT (supraventricular tachycardia) ([place])   . Type II diabetes mellitus ([place])   . URTICARIA 02/06/2010   Past Surgical History:  Procedure Laterality Date  . ANTERIOR CERVICAL DECOMP/DISCECTOMY FUSION  06/04/2016   C3-4 and C4-5 anterior cervical decompression and arthrodesis with structural allograft and anterior cervical plating  . ANTERIOR CERVICAL DECOMP/DISCECTOMY FUSION N/A 06/04/2016   Procedure: CERVICAL THREE-FOUR, CERVICAL FOUR-FIVE ANTERIOR CERVICAL DECOMPRESSION/DISCECTOMY FUSION;  Surgeon: Jovita Gamma, MD;  Location: [facility];  Service: Neurosurgery;  Laterality: N/A;  . APPENDECTOMY  1963  . BACK SURGERY    . BILATERAL CARPAL TUNNEL RELEASE Bilateral   . CARDIAC  CATHETERIZATION     "[place]"  . CATARACT EXTRACTION W/ INTRAOCULAR LENS  IMPLANT, BILATERAL Bilateral   . JOINT REPLACEMENT    . NASAL SINUS SURGERY  1989  . POSTERIOR LUMBAR FUSION     L4, L5  . TOTAL KNEE ARTHROPLASTY Left 2007  . TRANSURETHRAL RESECTION OF PROSTATE     bph  . ULNAR TUNNEL RELEASE Right    Family History  Problem Relation Age of Onset  . Prostate cancer Brother   . Cancer Father   . Arthritis Other   . Diabetes Other    Social History   Socioeconomic History  . Marital status: Married    Spouse name: Not on file  . Number of children: 3  . Years of education: Not on file  . Highest education level: Not on file  Social Needs  . Financial resource strain: Not on file  . Food insecurity - worry: Not on file  . Food insecurity - inability: Not on file  . Transportation needs - medical: Not on file  . Transportation needs - non-medical: Not on file  Occupational History  . Occupation: Retired    Fish farm manager: OTHER  Tobacco Use  . Smoking status: Current Every Day Smoker    Packs/day: 0.50    Years: 57.00    Pack years: 28.50    Types: Cigarettes    Last attempt to quit: 04/29/2016    Years since quitting: 1.4  . Smokeless tobacco: Never Used  Substance and Sexual Activity  . Alcohol use: Yes    Alcohol/week: 0.6 oz    Types: 1 Shots of liquor per week    Comment: occasionally   . Drug use: No  . Sexual activity: Not Currently  Other Topics Concern  . Not on file  Social History Narrative  . Not on file    Outpatient Encounter Medications as of 10/21/2017  Medication Sig  . aspirin 81 MG tablet Take 81 mg by mouth every evening.   . CHROMIUM-CINNAMON PO Take 2,000 mg by mouth daily.  . Cyanocobalamin (VITAMIN B 12 PO) Take 1 tablet by mouth daily.  . finasteride (PROSCAR) 5 MG tablet Take 5 mg by mouth daily.  . furosemide (LASIX) 40 MG tablet Take 40 mg by mouth 2 (two) times daily.  Marland Kitchen gabapentin (NEURONTIN) 300 MG capsule TAKE ONE CAPSULE  THREE TIMES DAILY  . glimepiride (AMARYL) 1 MG tablet Take 1 mg by mouth 2 (two) times daily. Reported on 11/21/2015  . LANTUS SOLOSTAR 100 UNIT/ML Solostar Pen Inject 15 Units into the skin daily at 10 pm.   . metFORMIN (GLUCOPHAGE) 500 MG tablet Take 1,000 mg by  mouth 2 (two) times daily with a meal.   . nystatin cream (MYCOSTATIN) Apply 1 application topically 2 (two) times daily.  . pravastatin (PRAVACHOL) 40 MG tablet TAKE ONE TABLET EVERY EVENING  . Turmeric 450 MG CAPS Take 450 mg by mouth daily.  . verapamil (CALAN-SR) 240 MG CR tablet TAKE ONE TABLET EACH DAY  . fluconazole (DIFLUCAN) 100 MG tablet Take 1 tablet (100 mg total) by mouth daily. (Patient not taking: Reported on 10/21/2017)  . metoprolol tartrate (LOPRESSOR) 25 MG tablet Take 1 tablet (25 mg total) by mouth 2 (two) times daily. Twice daily as needed for palpitations   No facility-administered encounter medications on file as of 10/21/2017.     Activities of Daily Living In your present state of health, do you have any difficulty performing the following activities: 10/21/2017  Hearing? N  Vision? N  Difficulty concentrating or making decisions? N  Comment manages his life as well as diabetes   Walking or climbing stairs? N  Dressing or bathing? N  Doing errands, shopping? N  Preparing Food and eating ? N  Using the Toilet? N  In the past six months, have you accidently leaked urine? Y  Comment goes to [facility]   Do you have problems with loss of bowel control? N  Managing your Medications? N  Managing your Finances? N  Housekeeping or managing your Housekeeping? N  Some recent data might be hidden    Patient Care Team: [name], MD as PCP - General Stanford Breed Denice Bors, MD as Referring Physician (Cardiology) Jacelyn Pi, MD as Consulting Physician (Endocrinology)    Dr. Jeffie Pollock in [facility]    Assessment:   This is a routine wellness examination for Emiliano.  Exercise Activities and Dietary  recommendations Current Exercise Habits: Home exercise routine;Structured exercise class, Type of exercise: strength training/weights;Other - see comments(pool), Time (Minutes): 60, Frequency (Times/Week): 5, Weekly Exercise (Minutes/Week): 300, Intensity: Moderate  Goals    . Weight (lb) < 200 lb (90.7 kg)     Will continue to see the dietician and see increase exercise as tolerated.    . Weight (lb) < 215 lb (97.5 kg)     Continue as you are        Fall Risk Fall Risk  10/21/2017 10/07/2017 06/12/2017 01/28/2017 09/19/2016  Falls in the past year? No No No No Yes  Number falls in past yr: - - - - 1     Depression Screen PHQ 2/9 Scores 10/21/2017 10/07/2017 06/12/2017 01/28/2017  PHQ - 2 Score 0 3 0 0    Cognitive Function MMSE - Mini Mental State Exam 10/21/2017 07/11/2016  Not completed: (No Data) (No Data)        Immunization History  Administered Date(s) Administered  . Influenza Split 09/09/2011, 08/19/2012  . Influenza Whole 09/23/2010  . Influenza, High Dose Seasonal PF 07/11/2016, 08/13/2017  . Influenza,inj,Quad PF,6+ Mos 08/12/2013, 08/24/2014, 08/01/2015  . Pneumococcal Conjugate-13 02/01/2014  . Pneumococcal Polysaccharide-23 09/23/2010  . Td 09/23/2010  . Tdap 04/24/2016  . Zoster Recombinat (Shingrix) 08/13/2017    Qualifies for Shingles Vaccine? Had one dose of shingrix 08/2017 Will have the next dose in January 2019   Screening Tests Health Maintenance  Topic Date Due  . OPHTHALMOLOGY EXAM  02/09/2018  . HEMOGLOBIN A1C  04/06/2018  . FOOT EXAM  10/06/2018  . URINE MICROALBUMIN  10/06/2018  . COLONOSCOPY  11/03/2018  . TETANUS/TDAP  04/24/2026  . INFLUENZA VACCINE  Completed  . Hepatitis C  Screening  Completed  . PNA vac Low Risk Adult  Completed    Plan:      PCP Notes   Health Maintenance Has AD at home but has not completed  Eye exam was normal this year  Has had one dose of Shingrix; will take the next in January   Following with  dietician and Dr. Chalmers Cater and doing well with diet and A1c  Updated metrics; see Dr. Almetta Lovely labs from 12/04   Abnormal Screens  BMI but working to reduce   Referrals  None  Patient concerns; Wants to stop asa and is consulting with his cardiologist  Nurse Concerns; As noted Very nice guy, extremely motivated to plan of care   Next PCP apt [facility]      I have personally reviewed and noted the following in the patient's chart:   . Medical and social history . Use of alcohol, tobacco or illicit drugs  . Current medications and supplements . Functional ability and status . Nutritional status . Physical activity . Advanced directives . List of other physicians . Hospitalizations, surgeries, and ER visits in previous 12 months . Vitals . Screenings to include cognitive, depression, and falls . Referrals and appointments  In addition, I have reviewed and discussed with patient certain preventive protocols, quality metrics, and best practice recommendations. A written personalized care plan for preventive services as well as general preventive health recommendations were provided to patient.     KHTXH,FSFSE, RN  10/21/2017  Agree with assessment as above.  [name] MD [facility] at [facility]

## 2017-10-21 ENCOUNTER — Ambulatory Visit (INDEPENDENT_AMBULATORY_CARE_PROVIDER_SITE_OTHER): Payer: Medicare Other

## 2017-10-21 VITALS — BP 100/60 | Ht 67.0 in | Wt 257.0 lb

## 2017-10-21 DIAGNOSIS — Z0000 Encounter for general adult medical examination without abnormal findings: Secondary | ICD-10-CM

## 2017-10-21 NOTE — Patient Instructions (Addendum)
Mr. Deans , Thank you for taking time to come for your Medicare Wellness Visit. I appreciate your ongoing commitment to your health goals. Please review the following plan we discussed and let me know if I can assist you in the future.   Keep taking very good care of yourself! It was a joy to see you today !    These are the goals we discussed: Goals    . Weight (lb) < 200 lb (90.7 kg)     Will continue to see the dietician and see increase exercise as tolerated.    . Weight (lb) < 215 lb (97.5 kg)     Continue as you are        This is a list of the screening recommended for you and due dates:  Health Maintenance  Topic Date Due  . Complete foot exam   03/28/2016  . Urine Protein Check  07/11/2017  . Hemoglobin A1C  07/15/2017  . Eye exam for diabetics  02/09/2018  . Colon Cancer Screening  11/03/2018  . Tetanus Vaccine  04/24/2026  . Flu Shot  Completed  .  Hepatitis C: One time screening is recommended by Center for Disease Control  (CDC) for  adults born from [birth year] through 1965.   Completed  . Pneumonia vaccines  Completed    Prevention of falls: Remove rugs or any tripping hazards in the home Use Non slip mats in bathtubs and showers Placing grab bars next to the toilet and or shower Placing handrails on both sides of the stair way Adding extra lighting in the home.   Personal safety issues reviewed:  1. Consider starting a community watch program per [facility] 2.  Changes batteries is smoke detector and/or carbon monoxide detector  3.  If you have firearms; keep them in a safe place 4.  Wear protection when in the sun; Always wear sunscreen or a hat; It is good to have your doctor check your skin annually or review any new areas of concern 5. Driving safety; Keep in the right lane; stay 3 car lengths behind the car in front of you on the highway; look 3 times prior to pulling out; carry your cell phone everywhere you go!    Learn about the Yellow  Dot program:  The program allows first responders at your emergency to have access to who your physician is, as well as your medications and medical conditions.  Citizens requesting the Yellow Dot Packages should contact Master Corporal Nunzio Cobbs at the [facility] [phone] for the first week of the program and beginning the week after Easter citizens should contact their Scientist, physiological.      Fall Prevention in the Home Falls can cause injuries. They can happen to people of all ages. There are many things you can do to make your home safe and to help prevent falls. What can I do on the outside of my home?  Regularly fix the edges of walkways and driveways and fix any cracks.  Remove anything that might make you trip as you walk through a door, such as a raised step or threshold.  Trim any bushes or trees on the path to your home.  Use bright outdoor lighting.  Clear any walking paths of anything that might make someone trip, such as rocks or tools.  Regularly check to see if handrails are loose or broken. Make sure that both sides of any steps have handrails.  Any  raised decks and porches should have guardrails on the edges.  Have any leaves, snow, or ice cleared regularly.  Use sand or salt on walking paths during winter.  Clean up any spills in your garage right away. This includes oil or grease spills. What can I do in the bathroom?  Use night lights.  Install grab bars by the toilet and in the tub and shower. Do not use towel bars as grab bars.  Use non-skid mats or decals in the tub or shower.  If you need to sit down in the shower, use a plastic, non-slip stool.  Keep the floor dry. Clean up any water that spills on the floor as soon as it happens.  Remove soap buildup in the tub or shower regularly.  Attach bath mats securely with double-sided non-slip rug tape.  Do not have throw rugs and other things on the floor that  can make you trip. What can I do in the bedroom?  Use night lights.  Make sure that you have a light by your bed that is easy to reach.  Do not use any sheets or blankets that are too big for your bed. They should not hang down onto the floor.  Have a firm chair that has side arms. You can use this for support while you get dressed.  Do not have throw rugs and other things on the floor that can make you trip. What can I do in the kitchen?  Clean up any spills right away.  Avoid walking on wet floors.  Keep items that you use a lot in easy-to-reach places.  If you need to reach something above you, use a strong step stool that has a grab bar.  Keep electrical cords out of the way.  Do not use floor polish or wax that makes floors slippery. If you must use wax, use non-skid floor wax.  Do not have throw rugs and other things on the floor that can make you trip. What can I do with my stairs?  Do not leave any items on the stairs.  Make sure that there are handrails on both sides of the stairs and use them. Fix handrails that are broken or loose. Make sure that handrails are as long as the stairways.  Check any carpeting to make sure that it is firmly attached to the stairs. Fix any carpet that is loose or worn.  Avoid having throw rugs at the top or bottom of the stairs. If you do have throw rugs, attach them to the floor with carpet tape.  Make sure that you have a light switch at the top of the stairs and the bottom of the stairs. If you do not have them, ask someone to add them for you. What else can I do to help prevent falls?  Wear shoes that: ? Do not have high heels. ? Have rubber bottoms. ? Are comfortable and fit you well. ? Are closed at the toe. Do not wear sandals.  If you use a stepladder: ? Make sure that it is fully opened. Do not climb a closed stepladder. ? Make sure that both sides of the stepladder are locked into place. ? Ask someone to hold it for  you, if possible.  Clearly mark and make sure that you can see: ? Any grab bars or handrails. ? First and last steps. ? Where the edge of each step is.  Use tools that help you move around (mobility aids) if they  are needed. These include: ? Canes. ? Walkers. ? Scooters. ? Crutches.  Turn on the lights when you go into a dark area. Replace any light bulbs as soon as they burn out.  Set up your furniture so you have a clear path. Avoid moving your furniture around.  If any of your floors are uneven, fix them.  If there are any pets around you, be aware of where they are.  Review your medicines with your doctor. Some medicines can make you feel dizzy. This can increase your chance of falling. Ask your doctor what other things that you can do to help prevent falls. This information is not intended to replace advice given to you by your health care provider. Make sure you discuss any questions you have with your health care provider. Document Released: 08/16/2009 Document Revised: 03/27/2016 Document Reviewed: 11/24/2014 Elsevier Interactive Patient Education  2018 [place] Maintenance, Male A healthy lifestyle and preventive care is important for your health and wellness. Ask your health care provider about what schedule of regular examinations is right for you. What should I know about weight and diet? Eat a Healthy Diet  Eat plenty of vegetables, fruits, whole grains, low-fat dairy products, and lean protein.  Do not eat a lot of foods high in solid fats, added sugars, or salt.  Maintain a Healthy Weight Regular exercise can help you achieve or maintain a healthy weight. You should:  Do at least 150 minutes of exercise each week. The exercise should increase your heart rate and make you sweat (moderate-intensity exercise).  Do strength-training exercises at least twice a week.  Watch Your Levels of Cholesterol and Blood Lipids  Have your blood tested for  lipids and cholesterol every 5 years starting at 72 years of age. If you are at high risk for heart disease, you should start having your blood tested when you are 72 years old. You may need to have your cholesterol levels checked more often if: ? Your lipid or cholesterol levels are high. ? You are older than 72 years of age. ? You are at high risk for heart disease.  What should I know about cancer screening? Many types of cancers can be detected early and may often be prevented. Lung Cancer  You should be screened every year for lung cancer if: ? You are a current smoker who has smoked for at least 30 years. ? You are a former smoker who has quit within the past 15 years.  Talk to your health care provider about your screening options, when you should start screening, and how often you should be screened.  Colorectal Cancer  Routine colorectal cancer screening usually begins at 72 years of age and should be repeated every 5-10 years until you are 72 years old. You may need to be screened more often if early forms of precancerous polyps or small growths are found. Your health care provider may recommend screening at an earlier age if you have risk factors for colon cancer.  Your health care provider may recommend using home test kits to check for hidden blood in the stool.  A small camera at the end of a tube can be used to examine your colon (sigmoidoscopy or colonoscopy). This checks for the earliest forms of colorectal cancer.  Prostate and Testicular Cancer  Depending on your age and overall health, your health care provider may do certain tests to screen for prostate and testicular cancer.  Talk to your health  care provider about any symptoms or concerns you have about testicular or prostate cancer.  Skin Cancer  Check your skin from head to toe regularly.  Tell your health care provider about any new moles or changes in moles, especially if: ? There is a change in a mole's  size, shape, or color. ? You have a mole that is larger than a pencil eraser.  Always use sunscreen. Apply sunscreen liberally and repeat throughout the day.  Protect yourself by wearing long sleeves, pants, a wide-brimmed hat, and sunglasses when outside.  What should I know about heart disease, diabetes, and high blood pressure?  If you are 57-63 years of age, have your blood pressure checked every 3-5 years. If you are 21 years of age or older, have your blood pressure checked every year. You should have your blood pressure measured twice-once when you are at a hospital or clinic, and once when you are not at a hospital or clinic. Record the average of the two measurements. To check your blood pressure when you are not at a hospital or clinic, you can use: ? An automated blood pressure machine at a pharmacy. ? A home blood pressure monitor.  Talk to your health care provider about your target blood pressure.  If you are between 37-23 years old, ask your health care provider if you should take aspirin to prevent heart disease.  Have regular diabetes screenings by checking your fasting blood sugar level. ? If you are at a normal weight and have a low risk for diabetes, have this test once every three years after the age of 24. ? If you are overweight and have a high risk for diabetes, consider being tested at a younger age or more often.  A one-time screening for abdominal aortic aneurysm (AAA) by ultrasound is recommended for men aged 58-75 years who are current or former smokers. What should I know about preventing infection? Hepatitis B If you have a higher risk for hepatitis B, you should be screened for this virus. Talk with your health care provider to find out if you are at risk for hepatitis B infection. Hepatitis C Blood testing is recommended for:  Everyone born from [birth year] through 1966.  Anyone with known risk factors for hepatitis C.  Sexually Transmitted Diseases  (STDs)  You should be screened each year for STDs including gonorrhea and chlamydia if: ? You are sexually active and are younger than 72 years of age. ? You are older than 72 years of age and your health care provider tells you that you are at risk for this type of infection. ? Your sexual activity has changed since you were last screened and you are at an increased risk for chlamydia or gonorrhea. Ask your health care provider if you are at risk.  Talk with your health care provider about whether you are at high risk of being infected with HIV. Your health care provider may recommend a prescription medicine to help prevent HIV infection.  What else can I do?  Schedule regular health, dental, and eye exams.  Stay current with your vaccines (immunizations).  Do not use any tobacco products, such as cigarettes, chewing tobacco, and e-cigarettes. If you need help quitting, ask your health care provider.  Limit alcohol intake to no more than 2 drinks per day. One drink equals 12 ounces of beer, 5 ounces of wine, or 1 ounces of hard liquor.  Do not use street drugs.  Do not share needles.  Ask your health care provider for help if you need support or information about quitting drugs.  Tell your health care provider if you often feel depressed.  Tell your health care provider if you have ever been abused or do not feel safe at home. This information is not intended to replace advice given to you by your health care provider. Make sure you discuss any questions you have with your health care provider. Document Released: 04/17/2008 Document Revised: 06/18/2016 Document Reviewed: 07/24/2015 Elsevier Interactive Patient Education  [employer].

## 2017-11-16 DIAGNOSIS — R310 Gross hematuria: Secondary | ICD-10-CM | POA: Diagnosis not present

## 2017-11-16 DIAGNOSIS — N5201 Erectile dysfunction due to arterial insufficiency: Secondary | ICD-10-CM | POA: Diagnosis not present

## 2017-11-17 DIAGNOSIS — Z96652 Presence of left artificial knee joint: Secondary | ICD-10-CM | POA: Diagnosis not present

## 2017-11-17 DIAGNOSIS — M25551 Pain in right hip: Secondary | ICD-10-CM | POA: Diagnosis not present

## 2017-11-17 DIAGNOSIS — M25552 Pain in left hip: Secondary | ICD-10-CM | POA: Diagnosis not present

## 2017-12-02 ENCOUNTER — Encounter: Payer: Self-pay | Admitting: Family Medicine

## 2017-12-02 ENCOUNTER — Ambulatory Visit (INDEPENDENT_AMBULATORY_CARE_PROVIDER_SITE_OTHER): Payer: Medicare Other | Admitting: Family Medicine

## 2017-12-02 VITALS — BP 120/68 | HR 89 | Temp 98.3°F | Wt 262.0 lb

## 2017-12-02 DIAGNOSIS — L309 Dermatitis, unspecified: Secondary | ICD-10-CM

## 2017-12-02 MED ORDER — DESOXIMETASONE 0.25 % EX CREA: 1.0000 "application " | TOPICAL_CREAM | Freq: Two times a day (BID) | CUTANEOUS | 1 refills | Status: AC

## 2017-12-02 NOTE — Progress Notes (Signed)
Subjective:     Patient ID: Steve Charles, male   DOB: Aug 20, 1946, 72 y.o.   MRN: [id number]  HPI Patient seen for rash, anal region. He had some recent groin rash which did improve with fluconazole. His current rash has been same area intermittently in the past . He tried some triamcinolone once which did not seem to help several months ago. He is not aware of any food allergies. No change of toilet paper. He has itching especially at night. No recent change of soaps.  Past Medical History:  Diagnosis Date  . Arthritis    "entire spine; both of my hands" (06/04/2016)  . BPH (benign prostatic hypertrophy)   . CAD (coronary artery disease)    a. 50% LAD stenosis by cath in 2012 b. 03/2017: NST showing no evidence of ischemia, overall low-risk.   . Chronic lower back pain   . COPD (chronic obstructive pulmonary disease) ([place]) 05/01/2011   PFT 05/01/11>>FEV1 2.07(72%), FEV1% 65, TLC 6.79(111%), DLCO 69%, no BD  . DYSLIPIDEMIA 11/29/2009  . Dysrhythmia    SVT  . Exposure to TB 9 yrs ago   no active infection  . HYPERTENSION, ESSENTIAL 11/29/2009  . OSA on CPAP 03/26/2011   CPAP 9 cm H2O  . Osteoarthritis   . Pneumonia 1990s  . SVT (supraventricular tachycardia) ([place])   . Type II diabetes mellitus ([place])   . URTICARIA 02/06/2010   Past Surgical History:  Procedure Laterality Date  . ANTERIOR CERVICAL DECOMP/DISCECTOMY FUSION  06/04/2016   C3-4 and C4-5 anterior cervical decompression and arthrodesis with structural allograft and anterior cervical plating  . ANTERIOR CERVICAL DECOMP/DISCECTOMY FUSION N/A 06/04/2016   Procedure: CERVICAL THREE-FOUR, CERVICAL FOUR-FIVE ANTERIOR CERVICAL DECOMPRESSION/DISCECTOMY FUSION;  Surgeon: Jovita Gamma, MD;  Location: [facility];  Service: Neurosurgery;  Laterality: N/A;  . APPENDECTOMY  1963  . BACK SURGERY    . BILATERAL CARPAL TUNNEL RELEASE Bilateral   . CARDIAC CATHETERIZATION     "[place]"  . CATARACT EXTRACTION W/ INTRAOCULAR LENS   IMPLANT, BILATERAL Bilateral   . JOINT REPLACEMENT    . NASAL SINUS SURGERY  1989  . POSTERIOR LUMBAR FUSION     L4, L5  . TOTAL KNEE ARTHROPLASTY Left 2007  . TRANSURETHRAL RESECTION OF PROSTATE     bph  . ULNAR TUNNEL RELEASE Right     reports that he has been smoking cigarettes.  He has a 28.50 pack-year smoking history. he has never used smokeless tobacco. He reports that he drinks about 0.6 oz of alcohol per week. He reports that he does not use drugs. family history includes Arthritis in his other; Cancer in his father; Diabetes in his other; Prostate cancer in his brother. Allergies  Allergen Reactions  . Shellfish-Derived Products Hives    hives  . Codeine Sulfate Hives  . Morphine And Related Hives    Oral morphine/ hives   . Propoxyphene N-Acetaminophen Hives  . Ramipril Hives  . Shellfish Allergy Hives     Review of Systems  Gastrointestinal: Negative for constipation and diarrhea.  Skin: Positive for rash.       Objective:   Physical Exam  Constitutional: He appears well-developed and well-nourished.  Cardiovascular: Normal rate and regular rhythm.  Skin: Rash noted.  Patient has some nonspecific mild erythema perianal region. Non-tender. No abscess.       Assessment:     Perianal dermatitis.    Plan:     -keep area clean-especially after bowel movements -Short-term trial of  steroid cream no longer than 2 weeks continuously and touch base in 1-2 weeks if not improving  Steve Post MD [facility] at [facility]

## 2017-12-02 NOTE — Patient Instructions (Signed)
Consider anti-itch soap such as Aveeno Do not use the steroid cream longer than 2 weeks continuously.

## 2018-01-01 ENCOUNTER — Other Ambulatory Visit: Payer: Self-pay | Admitting: Cardiology

## 2018-01-26 DIAGNOSIS — M5136 Other intervertebral disc degeneration, lumbar region: Secondary | ICD-10-CM | POA: Diagnosis not present

## 2018-01-26 DIAGNOSIS — M4726 Other spondylosis with radiculopathy, lumbar region: Secondary | ICD-10-CM | POA: Diagnosis not present

## 2018-01-26 DIAGNOSIS — M5440 Lumbago with sciatica, unspecified side: Secondary | ICD-10-CM | POA: Diagnosis not present

## 2018-01-26 DIAGNOSIS — M5416 Radiculopathy, lumbar region: Secondary | ICD-10-CM | POA: Diagnosis not present

## 2018-01-26 DIAGNOSIS — Z6839 Body mass index (BMI) 39.0-39.9, adult: Secondary | ICD-10-CM | POA: Diagnosis not present

## 2018-01-26 DIAGNOSIS — M48062 Spinal stenosis, lumbar region with neurogenic claudication: Secondary | ICD-10-CM | POA: Diagnosis not present

## 2018-02-08 DIAGNOSIS — R609 Edema, unspecified: Secondary | ICD-10-CM | POA: Diagnosis not present

## 2018-02-08 DIAGNOSIS — I10 Essential (primary) hypertension: Secondary | ICD-10-CM | POA: Diagnosis not present

## 2018-02-08 DIAGNOSIS — D751 Secondary polycythemia: Secondary | ICD-10-CM | POA: Diagnosis not present

## 2018-02-08 DIAGNOSIS — E1165 Type 2 diabetes mellitus with hyperglycemia: Secondary | ICD-10-CM | POA: Diagnosis not present

## 2018-02-09 ENCOUNTER — Encounter: Payer: Medicare Other | Attending: Endocrinology | Admitting: *Deleted

## 2018-02-09 DIAGNOSIS — E118 Type 2 diabetes mellitus with unspecified complications: Secondary | ICD-10-CM

## 2018-02-09 DIAGNOSIS — E119 Type 2 diabetes mellitus without complications: Secondary | ICD-10-CM | POA: Diagnosis not present

## 2018-02-09 DIAGNOSIS — Z713 Dietary counseling and surveillance: Secondary | ICD-10-CM | POA: Diagnosis not present

## 2018-02-09 NOTE — Progress Notes (Signed)
Medical Nutrition Therapy: 02/09/2018  Appt start time:  1400    end time:  1430  Assessment:  Primary concerns today: patient here for follow up diabetes education. Patient states his last A1c was 7.0 % He continues to exercise almost every day and has also started walking in the pool. He continues to check his BG daily in the AM but these values have increased to the 130-150 mg/dl lately. He states he met with Dr. Chalmers Cater yesterday and they have agreed to increase his dose of Lantus from 15 to 17 units daily. We will review the Tanita Scale results today.   TANITA  BODY COMP RESULTS on 07/21/13  Weight 255.5 lb   BMI (kg/m^2) 38.8   Fat Mass (lbs) 97.5 lb   Fat Free Mass (lbs) 158.0 lb   Total Body Water (lbs) 115.5 lb    TANITA  BODY COMP RESULTS on 01/20/14  Weight 248.5 lb   BMI (kg/m^2) 37.8   Fat Mass (lbs) 96.0 lb   Fat Free Mass (lbs) 152.5 lb   Total Body Water (lbs) 111.5 lb    TANITA  BODY COMP RESULTS 12/07/2014   Weight 258 lb   BMI (kg/m^2) 39.2   Fat Mass (lbs) 86.5 lb   Fat Free Mass (lbs) 171.5 lb   Total Body Water (lbs) 125.5 lb     TANITA  BODY COMP RESULTS 12/06/15  Weight (lbs) 237.5   BMI (kg/m^2) 36.1   Fat Mass (lbs) 89.0   Fat Free Mass (lbs) 148.5   Total Body Water (lbs) 108.5    TANITA  BODY COMP RESULTS 01/22/2017  Weight (lbs) 245.2    BMI (kg/m^2) 37.3   Fat Mass (lbs) 104.8   Fat Free Mass (lbs) 140.4   Total Body Water (%) 107.8    TANITA  BODY COMP RESULTS 06/12/2017  Weight (lbs) 251.6   BMI (kg/m^2) 46.0   Fat Mass (lbs) 130.6   Fat Free Mass (lbs) 121.0   Total Body Water (lbs) NA   TANITA  BODY COMP RESULTS 10/07/2017  Weight (lbs)  257.8 lb   BMI (kg/m^2) 40.4   Fat Mass (lbs) 101.0   Fat Free Mass (lbs) 156.8   Total Body Water (lbs) 117.2   TANITA  BODY COMP RESULTS  02/09/2018  Weight (lbs) 258.2   BMI (kg/m^2) 39.6   Fat Mass (lbs) 102.2   Fat Free Mass (lbs) 156.0   Total Body Water (lbs) 116.8   MEDICATIONS: see list,  Diabetes medications are now Amaryl, Metformin and Lantus insulin  Usual physical activity: water exercises several days a week    Intervention: Acknowledged his continued success with controlling his BG effectively. Also reviewed stability of weight along with Fat Mass compared to Fat Free Mass. Although he does state his appetite is decreased as far as the number of times he eats throughout the day, he is still trying to spread his carb containing foods fairly evenly throughout the day, and he appears to be managing his diabetes and health very well.    Plan:  Continue to aim for 3 Carb Choices per meal (45 grams) +/- 1 either way  Continue to aim for 0-2 Carbs per snack if hungry  Continue reading food labels for Total Carbohydrate and Fat Grams of foods Continue with your activity level daily as tolerated Continue checking BG at alternate times per day on occasion Continue taking Lantus at night as directed by your MD    Handouts given during  visit include: Tanita Scale results handout Snack idea handout  Monitoring/Evaluation:  Dietary intake, exercise, reading food labels, and body weight in 4 months.

## 2018-02-10 ENCOUNTER — Other Ambulatory Visit: Payer: Self-pay | Admitting: Cardiology

## 2018-02-11 NOTE — Telephone Encounter (Signed)
REFILL 

## 2018-02-12 ENCOUNTER — Telehealth: Payer: Self-pay | Admitting: Cardiology

## 2018-02-12 DIAGNOSIS — E785 Hyperlipidemia, unspecified: Secondary | ICD-10-CM

## 2018-02-12 DIAGNOSIS — I10 Essential (primary) hypertension: Secondary | ICD-10-CM

## 2018-02-12 NOTE — Telephone Encounter (Signed)
Returned the call to the patient. He would like to know if he needs labs prior to his appointment. Last labs scanned were BMET and CBC on 09/29/17

## 2018-02-12 NOTE — Telephone Encounter (Signed)
Lipids, liver and bmet [facility]

## 2018-02-12 NOTE — Telephone Encounter (Signed)
Patient made aware of lab orders and will come in to have these drawn prior to his appointment.

## 2018-02-12 NOTE — Telephone Encounter (Signed)
New Message::     Pt wants to know if he needs lab work before his appt on 03-09-18? If so please send an order to the lab.

## 2018-02-15 ENCOUNTER — Ambulatory Visit (INDEPENDENT_AMBULATORY_CARE_PROVIDER_SITE_OTHER)
Admission: RE | Admit: 2018-02-15 | Discharge: 2018-02-15 | Disposition: A | Discharge status: Home / Self Care | Payer: Medicare Other | Source: Ambulatory Visit | Attending: Acute Care | Admitting: Acute Care

## 2018-02-15 ENCOUNTER — Other Ambulatory Visit: Payer: Medicare Other | Admitting: *Deleted

## 2018-02-15 DIAGNOSIS — I10 Essential (primary) hypertension: Secondary | ICD-10-CM | POA: Diagnosis not present

## 2018-02-15 DIAGNOSIS — F17210 Nicotine dependence, cigarettes, uncomplicated: Secondary | ICD-10-CM

## 2018-02-15 DIAGNOSIS — E785 Hyperlipidemia, unspecified: Secondary | ICD-10-CM | POA: Diagnosis not present

## 2018-02-17 ENCOUNTER — Other Ambulatory Visit: Payer: Self-pay | Admitting: Acute Care

## 2018-02-17 DIAGNOSIS — F17210 Nicotine dependence, cigarettes, uncomplicated: Principal | ICD-10-CM

## 2018-02-17 DIAGNOSIS — Z122 Encounter for screening for malignant neoplasm of respiratory organs: Secondary | ICD-10-CM

## 2018-02-17 LAB — BASIC METABOLIC PANEL
BUN/Creatinine Ratio: 9 — ABNORMAL LOW (ref 10–24)
BUN: 9 mg/dL (ref 8–27)
CHLORIDE: 94 mmol/L — AB (ref 96–106)
CO2: 28 mmol/L (ref 20–29)
Calcium: 9.5 mg/dL (ref 8.6–10.2)
Creatinine, Ser: 1 mg/dL (ref 0.76–1.27)
GFR calc non Af Amer: 74 mL/min/{1.73_m2} (ref >59)
GFR, EST AFRICAN AMERICAN: 86 mL/min/{1.73_m2} (ref >59)
GLUCOSE: 129 mg/dL — AB (ref 65–99)
Potassium: 3.7 mmol/L (ref 3.5–5.2)
Sodium: 139 mmol/L (ref 134–144)

## 2018-02-17 LAB — HEPATIC FUNCTION PANEL
ALBUMIN: 4.2 g/dL (ref 3.5–4.8)
ALT: 20 IU/L (ref 0–44)
AST: 15 IU/L (ref 0–40)
Alkaline Phosphatase: 95 IU/L (ref 39–117)
BILIRUBIN TOTAL: 0.5 mg/dL (ref 0.0–1.2)
BILIRUBIN, DIRECT: 0.16 mg/dL (ref 0.00–0.40)
TOTAL PROTEIN: 7 g/dL (ref 6.0–8.5)

## 2018-02-17 LAB — LIPID PANEL
CHOL/HDL RATIO: 4.3 ratio (ref 0.0–5.0)
Cholesterol, Total: 141 mg/dL (ref 100–199)
HDL: 33 mg/dL — ABNORMAL LOW (ref >39)
LDL Calculated: 80 mg/dL (ref 0–99)
TRIGLYCERIDES: 139 mg/dL (ref 0–149)
VLDL Cholesterol Cal: 28 mg/dL (ref 5–40)

## 2018-02-26 ENCOUNTER — Other Ambulatory Visit: Payer: Self-pay | Admitting: Neurosurgery

## 2018-02-26 DIAGNOSIS — M5416 Radiculopathy, lumbar region: Secondary | ICD-10-CM

## 2018-03-03 ENCOUNTER — Ambulatory Visit
Admission: RE | Admit: 2018-03-03 | Discharge: 2018-03-03 | Disposition: A | Discharge status: Home / Self Care | Payer: Medicare Other | Source: Ambulatory Visit | Attending: Neurosurgery | Admitting: Neurosurgery

## 2018-03-03 DIAGNOSIS — M545 Low back pain: Secondary | ICD-10-CM | POA: Diagnosis not present

## 2018-03-03 DIAGNOSIS — M5416 Radiculopathy, lumbar region: Secondary | ICD-10-CM

## 2018-03-03 MED ORDER — METHYLPREDNISOLONE ACETATE 40 MG/ML INJ SUSP (RADIOLOG
120.0000 mg | Freq: Once | INTRAMUSCULAR | Status: AC
  Administered 2018-03-03: 120 mg via EPIDURAL

## 2018-03-03 MED ORDER — IOPAMIDOL (ISOVUE-M 200) INJECTION 41%
1.0000 mL | Freq: Once | INTRAMUSCULAR | Status: AC
  Administered 2018-03-03: 1 mL via EPIDURAL

## 2018-03-03 NOTE — Discharge Instructions (Signed)

## 2018-03-08 ENCOUNTER — Other Ambulatory Visit: Payer: Medicare Other

## 2018-03-08 DIAGNOSIS — M21371 Foot drop, right foot: Secondary | ICD-10-CM | POA: Diagnosis not present

## 2018-03-08 DIAGNOSIS — Z6838 Body mass index (BMI) 38.0-38.9, adult: Secondary | ICD-10-CM | POA: Diagnosis not present

## 2018-03-08 DIAGNOSIS — M5136 Other intervertebral disc degeneration, lumbar region: Secondary | ICD-10-CM | POA: Diagnosis not present

## 2018-03-08 DIAGNOSIS — M5416 Radiculopathy, lumbar region: Secondary | ICD-10-CM | POA: Diagnosis not present

## 2018-03-08 DIAGNOSIS — M546 Pain in thoracic spine: Secondary | ICD-10-CM | POA: Diagnosis not present

## 2018-03-08 DIAGNOSIS — M4316 Spondylolisthesis, lumbar region: Secondary | ICD-10-CM | POA: Diagnosis not present

## 2018-03-08 DIAGNOSIS — M47816 Spondylosis without myelopathy or radiculopathy, lumbar region: Secondary | ICD-10-CM | POA: Diagnosis not present

## 2018-03-08 DIAGNOSIS — R030 Elevated blood-pressure reading, without diagnosis of hypertension: Secondary | ICD-10-CM | POA: Diagnosis not present

## 2018-03-08 NOTE — Progress Notes (Signed)
Cardiology Office Note   Date:  03/09/2018   ID:  Steve Charles, DOB 1945/11/24, MRN [id number]  PCP:  Steve Post, MD  Cardiologist:  Dr. Stanford Breed  Chief Complaint  Patient presents with  . Follow-up  . Atrial Fibrillation  . Coronary Artery Disease     History of Present Illness: Steve Charles is a 72 y.o. male who presents for ongoing assessment and management of atrial fib, non-obstructive CAD per cardia cath 02/2011, with normal LV fx and pulmonary pressures. Most significant disease was found in LAD at 50%; SVT treated with verapamil, with ongoing PVC's per Holter Monitor 09/2015 with chronic palpitations.  Normal nuclear medicine study in 03/2017; chronic tobacco abuse, and chronic LEE.   He is here today for medication refills and follow up of labs. He has multiple questions.  He is followed closely by endocrinology as well.  His most recent stress test was completed in May 2018 and was normal without evidence of ischemia.  He denies any recurrent symptoms, no chest pain dyspnea intermittent claudication symptoms or significant fatigue.  He has been medically compliant.  He would like to stop pravastatin and change to another statin medication to "mix things up".  Pravastatin does have his cholesterol controlled however LDL is 87.  He would not like to add any new medications but change his current statin dose.  He had been on rosuvastatin in the past.  He states that he occasionally continues to have lower extremity edema which can occur on intermittent occasions.  He remains active walking 3 miles a day.  Past Medical History:  Diagnosis Date  . Arthritis    "entire spine; both of my hands" (06/04/2016)  . BPH (benign prostatic hypertrophy)   . CAD (coronary artery disease)    a. 50% LAD stenosis by cath in 2012 b. 03/2017: NST showing no evidence of ischemia, overall low-risk.   . Chronic lower back pain   . COPD (chronic obstructive pulmonary disease) ([place]) 05/01/2011     PFT 05/01/11>>FEV1 2.07(72%), FEV1% 65, TLC 6.79(111%), DLCO 69%, no BD  . DYSLIPIDEMIA 11/29/2009  . Dysrhythmia    SVT  . Exposure to TB 9 yrs ago   no active infection  . HYPERTENSION, ESSENTIAL 11/29/2009  . OSA on CPAP 03/26/2011   CPAP 9 cm H2O  . Osteoarthritis   . Pneumonia 1990s  . SVT (supraventricular tachycardia) ([place])   . Type II diabetes mellitus ([place])   . URTICARIA 02/06/2010    Past Surgical History:  Procedure Laterality Date  . ANTERIOR CERVICAL DECOMP/DISCECTOMY FUSION  06/04/2016   C3-4 and C4-5 anterior cervical decompression and arthrodesis with structural allograft and anterior cervical plating  . ANTERIOR CERVICAL DECOMP/DISCECTOMY FUSION N/A 06/04/2016   Procedure: CERVICAL THREE-FOUR, CERVICAL FOUR-FIVE ANTERIOR CERVICAL DECOMPRESSION/DISCECTOMY FUSION;  Surgeon: Jovita Gamma, MD;  Location: [facility];  Service: Neurosurgery;  Laterality: N/A;  . APPENDECTOMY  1963  . BACK SURGERY    . BILATERAL CARPAL TUNNEL RELEASE Bilateral   . CARDIAC CATHETERIZATION     "[place]"  . CATARACT EXTRACTION W/ INTRAOCULAR LENS  IMPLANT, BILATERAL Bilateral   . JOINT REPLACEMENT    . NASAL SINUS SURGERY  1989  . POSTERIOR LUMBAR FUSION     L4, L5  . TOTAL KNEE ARTHROPLASTY Left 2007  . TRANSURETHRAL RESECTION OF PROSTATE     bph  . ULNAR TUNNEL RELEASE Right      Current Outpatient Medications  Medication Sig Dispense Refill  . aspirin  81 MG tablet Take 81 mg by mouth every evening.     . CHROMIUM-CINNAMON PO Take 2,000 mg by mouth daily.    . Cyanocobalamin (VITAMIN B 12 PO) Take 1 tablet by mouth daily.    Marland Kitchen desoximetasone (TOPICORT) 0.25 % cream Apply 1 application topically 2 (two) times daily. 15 g 1  . diclofenac sodium (VOLTAREN) 1 % GEL Apply topically 4 (four) times daily.    . finasteride (PROSCAR) 5 MG tablet Take 5 mg by mouth daily.    . furosemide (LASIX) 40 MG tablet Take 40 mg by mouth 2 (two) times daily.    Marland Kitchen gabapentin (NEURONTIN) 300 MG  capsule TAKE ONE CAPSULE THREE TIMES DAILY 270 capsule 1  . glimepiride (AMARYL) 1 MG tablet Take 1 mg by mouth 2 (two) times daily. Reported on 11/21/2015  4  . LANTUS SOLOSTAR 100 UNIT/ML Solostar Pen Inject 15 Units into the skin daily at 10 pm.   1  . metFORMIN (GLUCOPHAGE) 500 MG tablet Take 1,000 mg by mouth 2 (two) times daily with a meal.     . nystatin cream (MYCOSTATIN) Apply 1 application topically 2 (two) times daily. 30 g 2  . pravastatin (PRAVACHOL) 40 MG tablet Take 1 tablet (40 mg total) by mouth every evening. KEEP OV. 30 tablet 3  . Turmeric 450 MG CAPS Take 450 mg by mouth daily.    . verapamil (CALAN-SR) 240 MG CR tablet TAKE ONE TABLET EACH DAY 90 tablet 3  . metoprolol tartrate (LOPRESSOR) 25 MG tablet Take 1 tablet (25 mg total) by mouth 2 (two) times daily. Twice daily as needed for palpitations 60 tablet 5   No current facility-administered medications for this visit.     Allergies:   Codeine sulfate; Morphine and related; Propoxyphene n-acetaminophen; Ramipril; and Shellfish allergy    Social History:  The patient  reports that he has been smoking cigarettes.  He has a 28.50 pack-year smoking history. He has never used smokeless tobacco. He reports that he drinks about 0.6 oz of alcohol per week. He reports that he does not use drugs.   Family History:  The patient's family history includes Arthritis in his other; Cancer in his father; Diabetes in his other; Prostate cancer in his brother.    ROS: All other systems are reviewed and negative. Unless otherwise mentioned in H&P    PHYSICAL EXAM: VS:  Ht 5\' 8"  (1.727 m)   Wt 256 lb (116.1 kg)   BMI 38.92 kg/m  , BMI Body mass index is 38.92 kg/m. GEN: Well nourished, well developed, in no acute distress  HEENT: normal  Neck: no JVD, carotid bruits, or masses Cardiac: RRR; tachycardic, no murmurs, rubs, or gallops,no edema  Respiratory:  Clear to auscultation bilaterally, normal work of breathing GI: soft,  nontender, nondistended, + BS MS: no deformity or atrophy  Skin: warm and dry, no rash Neuro:  Strength and sensation are intact Psych: euthymic mood, full affect   EKG: Sinus tachycardia heart rate 102 bpm   Recent Labs: 04/07/2017: Magnesium 1.5 02/15/2018: ALT 20; BUN 9; Creatinine, Ser 1.00; Potassium 3.7; Sodium 139    Lipid Panel    Component Value Date/Time   CHOL 141 02/15/2018 0000   TRIG 139 02/15/2018 0000   HDL 33 (L) 02/15/2018 0000   CHOLHDL 4.3 02/15/2018 0000   CHOLHDL 4 03/29/2015 1055   VLDL 20.4 03/29/2015 1055   LDLCALC 80 02/15/2018 0000   LDLDIRECT 75.0 03/27/2011 1146  Wt Readings from Last 3 Encounters:  03/09/18 256 lb (116.1 kg)  02/09/18 258 lb 3.2 oz (117.1 kg)  12/02/17 262 lb (118.8 kg)      Other studies Reviewed: Echocardiogram 11-14-13 Left ventricle: The cavity size was normal. Wall thickness was normal. Systolic function was normal. The estimated ejection fraction was in the range of 55% to 60%. Wall motion was normal; there were no regional wall motion abnormalities. Doppler parameters are consistent with abnormal left ventricular relaxation (grade 1 diastolic dysfunction). Doppler parameters are consistent with high ventricular filling pressure. - Atrial septum: No defect or patent foramen ovale was identified. - Impressions: Impaired relaxation with mildly elevated filling pressures, otherwise normal study. Impressions:  - Impaired relaxation with mildly elevated filling pressures, otherwise normal study.  NM Stress Test 03/05/2017 Study Highlights    Normal perfusion  Nuclear stress EF: 49%.  Blood pressure demonstrated a normal response to exercise.  There was no ST segment deviation noted during stress.  The study is normal.  This is a low risk study.     ASSESSMENT AND PLAN:  1.  Paroxysmal atrial fibrillation: Currently only on aspirin therapy.  Heart rate is not well controlled on  this office visit despite verapamil which she is Artie taken today.  He does have metoprolol which she can take as needed for rapid heart rhythm and he has not required this per his report.  I have talked with him about resting heart rate being in the 70s.  He is going to keep up with his blood pressure and heart rate at home and monitor this.  He may need to be on a low-dose metoprolol during the day to keep heart rate better controlled to avoid cardiomyopathy.  He is given 90-day supply on his verapamil. He denies rapid HR or palpitations.   2.  Hypercholesterolemia: Review of recent labs did demonstrate LDL of 87, total cholesterol 141.  The patient wishes to stop pravastatin and change to a different statin to "mix things up.".  I have offered atorvastatin but he states that his wife takes it an ambulance and recalls he is not comfortable with that, therefore we will try rosuvastatin 10 mg daily with repeat fasting lipids and LFTs in 6 weeks.  Goal of LDL is less than 70.  3.  Coronary artery disease: Most recent stress test in 2018, revealed normal perfusion without evidence of ischemia.  Recommend repeat cardiac testing every 5 years unless he becomes symptomatic.  Continue secondary prevention.  4.  Type 2 diabetes: The patient is followed by endocrinologist with ongoing management of blood sugar.  He is advised to continue to exercise keep weight controlled.  Current medicines are reviewed at length with the patient today.    Labs/ tests ordered today include: Fasting lipids and LFTs in 6 weeks.  Phill Myron. West Pugh, ANP, AACC   03/09/2018 8:08 AM    [facility] [address] [place], [place] [address] Phone: [phone]; Fax: [phone]

## 2018-03-09 ENCOUNTER — Encounter: Payer: Self-pay | Admitting: Adult Health

## 2018-03-09 ENCOUNTER — Ambulatory Visit (INDEPENDENT_AMBULATORY_CARE_PROVIDER_SITE_OTHER): Payer: Medicare Other | Admitting: Adult Health

## 2018-03-09 VITALS — BP 138/68 | HR 102 | Ht 68.0 in | Wt 256.0 lb

## 2018-03-09 DIAGNOSIS — I471 Supraventricular tachycardia: Secondary | ICD-10-CM | POA: Diagnosis not present

## 2018-03-09 DIAGNOSIS — I2510 Atherosclerotic heart disease of native coronary artery without angina pectoris: Secondary | ICD-10-CM | POA: Diagnosis not present

## 2018-03-09 DIAGNOSIS — Z79899 Other long term (current) drug therapy: Secondary | ICD-10-CM | POA: Diagnosis not present

## 2018-03-09 DIAGNOSIS — E785 Hyperlipidemia, unspecified: Secondary | ICD-10-CM | POA: Diagnosis not present

## 2018-03-09 DIAGNOSIS — I480 Paroxysmal atrial fibrillation: Secondary | ICD-10-CM | POA: Diagnosis not present

## 2018-03-09 MED ORDER — POTASSIUM CHLORIDE CRYS ER 20 MEQ PO TBCR: 20.0000 meq | EXTENDED_RELEASE_TABLET | Freq: Every day | ORAL | 3 refills | Status: AC

## 2018-03-09 MED ORDER — ROSUVASTATIN CALCIUM 10 MG PO TABS: 10.0000 mg | ORAL_TABLET | Freq: Every day | ORAL | 3 refills | Status: AC

## 2018-03-09 MED ORDER — VERAPAMIL HCL ER 240 MG PO TBCR: EXTENDED_RELEASE_TABLET | ORAL | 3 refills | Status: AC

## 2018-03-09 NOTE — Patient Instructions (Signed)
Medication Instructions:  STOP PRAVASTATIN  START Rosuvastatin 10MG  DAILY  If you need a refill on your cardiac medications before your next appointment, please call your pharmacy.  Labwork: LFT AND LIPID IN 6 WEEKS HERE IN OUR OFFICE AT [facility]  Take the provided lab slips with you to the lab for your blood draw.   You will need to fast. DO NOT EAT OR DRINK PAST MIDNIGHT.   Special Instructions: TAKE AND LOG BP AND HEART RATE DAILY  CALL BACK WITH POTASSIUM DOSING TO UPDATE YOU MED LIST  Follow-Up: Your physician wants you to follow-up in: [place] should receive a reminder letter in the mail two months in advance. If you do not receive a letter, please call our office SEPT 2019 to schedule the NOV 2019 follow-up appointment.   Thank you for choosing [facility] at [facility]!!

## 2018-03-17 MED ORDER — POTASSIUM 99 MG PO TABS: 1.0000 | ORAL_TABLET | Freq: Every day | ORAL | 0 refills | Status: AC

## 2018-03-17 MED ORDER — MAGNESIUM 400 MG PO TABS: 1.0000 | ORAL_TABLET | Freq: Every day | ORAL | Status: AC

## 2018-04-14 DIAGNOSIS — H5203 Hypermetropia, bilateral: Secondary | ICD-10-CM | POA: Diagnosis not present

## 2018-04-14 DIAGNOSIS — E119 Type 2 diabetes mellitus without complications: Secondary | ICD-10-CM | POA: Diagnosis not present

## 2018-04-14 DIAGNOSIS — H524 Presbyopia: Secondary | ICD-10-CM | POA: Diagnosis not present

## 2018-04-20 DIAGNOSIS — Z79899 Other long term (current) drug therapy: Secondary | ICD-10-CM | POA: Diagnosis not present

## 2018-04-20 LAB — HEPATIC FUNCTION PANEL
ALK PHOS: 79 IU/L (ref 39–117)
ALT: 17 IU/L (ref 0–44)
AST: 16 IU/L (ref 0–40)
Albumin: 4 g/dL (ref 3.5–4.8)
BILIRUBIN, DIRECT: 0.15 mg/dL (ref 0.00–0.40)
Bilirubin Total: 0.4 mg/dL (ref 0.0–1.2)
Total Protein: 6.5 g/dL (ref 6.0–8.5)

## 2018-04-20 LAB — LIPID PANEL
CHOLESTEROL TOTAL: 114 mg/dL (ref 100–199)
Chol/HDL Ratio: 3.9 ratio (ref 0.0–5.0)
HDL: 29 mg/dL — ABNORMAL LOW (ref >39)
LDL Calculated: 59 mg/dL (ref 0–99)
Triglycerides: 132 mg/dL (ref 0–149)
VLDL Cholesterol Cal: 26 mg/dL (ref 5–40)

## 2018-05-07 ENCOUNTER — Ambulatory Visit: Payer: Medicare Other | Admitting: Cardiology

## 2018-05-17 DIAGNOSIS — B356 Tinea cruris: Secondary | ICD-10-CM | POA: Diagnosis not present

## 2018-05-18 DIAGNOSIS — S51812A Laceration without foreign body of left forearm, initial encounter: Secondary | ICD-10-CM | POA: Diagnosis not present

## 2018-05-18 DIAGNOSIS — L03119 Cellulitis of unspecified part of limb: Secondary | ICD-10-CM | POA: Diagnosis not present

## 2018-05-18 DIAGNOSIS — S5012XA Contusion of left forearm, initial encounter: Secondary | ICD-10-CM | POA: Diagnosis not present

## 2018-06-01 DIAGNOSIS — D751 Secondary polycythemia: Secondary | ICD-10-CM | POA: Diagnosis not present

## 2018-06-01 DIAGNOSIS — E1165 Type 2 diabetes mellitus with hyperglycemia: Secondary | ICD-10-CM | POA: Diagnosis not present

## 2018-06-01 DIAGNOSIS — R609 Edema, unspecified: Secondary | ICD-10-CM | POA: Diagnosis not present

## 2018-06-10 DIAGNOSIS — E1165 Type 2 diabetes mellitus with hyperglycemia: Secondary | ICD-10-CM | POA: Diagnosis not present

## 2018-06-11 ENCOUNTER — Ambulatory Visit: Payer: Medicare Other | Admitting: *Deleted

## 2018-06-21 DIAGNOSIS — E119 Type 2 diabetes mellitus without complications: Secondary | ICD-10-CM | POA: Diagnosis not present

## 2018-06-21 DIAGNOSIS — E782 Mixed hyperlipidemia: Secondary | ICD-10-CM | POA: Diagnosis not present

## 2018-06-28 ENCOUNTER — Other Ambulatory Visit: Payer: Self-pay | Admitting: Neurosurgery

## 2018-06-28 DIAGNOSIS — M47816 Spondylosis without myelopathy or radiculopathy, lumbar region: Secondary | ICD-10-CM

## 2018-07-02 ENCOUNTER — Other Ambulatory Visit: Payer: Self-pay | Admitting: Neurosurgery

## 2018-07-02 ENCOUNTER — Ambulatory Visit
Admission: RE | Admit: 2018-07-02 | Discharge: 2018-07-02 | Disposition: A | Discharge status: Home / Self Care | Payer: Medicare Other | Source: Ambulatory Visit | Attending: Neurosurgery | Admitting: Neurosurgery

## 2018-07-02 DIAGNOSIS — M47816 Spondylosis without myelopathy or radiculopathy, lumbar region: Secondary | ICD-10-CM

## 2018-07-02 DIAGNOSIS — M47817 Spondylosis without myelopathy or radiculopathy, lumbosacral region: Secondary | ICD-10-CM | POA: Diagnosis not present

## 2018-07-02 MED ORDER — METHYLPREDNISOLONE ACETATE 40 MG/ML INJ SUSP (RADIOLOG
120.0000 mg | Freq: Once | INTRAMUSCULAR | Status: AC
  Administered 2018-07-02: 120 mg via EPIDURAL

## 2018-07-02 MED ORDER — IOPAMIDOL (ISOVUE-M 200) INJECTION 41%
1.0000 mL | Freq: Once | INTRAMUSCULAR | Status: AC
  Administered 2018-07-02: 1 mL via EPIDURAL

## 2018-07-09 ENCOUNTER — Other Ambulatory Visit: Payer: Medicare Other

## 2018-07-13 DIAGNOSIS — I2510 Atherosclerotic heart disease of native coronary artery without angina pectoris: Secondary | ICD-10-CM | POA: Diagnosis not present

## 2018-07-13 DIAGNOSIS — L299 Pruritus, unspecified: Secondary | ICD-10-CM | POA: Diagnosis not present

## 2018-07-13 DIAGNOSIS — R609 Edema, unspecified: Secondary | ICD-10-CM | POA: Diagnosis not present

## 2018-07-13 DIAGNOSIS — I471 Supraventricular tachycardia: Secondary | ICD-10-CM | POA: Diagnosis not present

## 2018-07-13 DIAGNOSIS — H539 Unspecified visual disturbance: Secondary | ICD-10-CM | POA: Diagnosis not present

## 2018-07-13 DIAGNOSIS — E1142 Type 2 diabetes mellitus with diabetic polyneuropathy: Secondary | ICD-10-CM | POA: Diagnosis not present

## 2018-07-13 DIAGNOSIS — R1310 Dysphagia, unspecified: Secondary | ICD-10-CM | POA: Diagnosis not present

## 2018-07-13 DIAGNOSIS — J449 Chronic obstructive pulmonary disease, unspecified: Secondary | ICD-10-CM | POA: Diagnosis not present

## 2018-07-13 DIAGNOSIS — Z720 Tobacco use: Secondary | ICD-10-CM | POA: Diagnosis not present

## 2018-07-23 DIAGNOSIS — I471 Supraventricular tachycardia: Secondary | ICD-10-CM | POA: Diagnosis not present

## 2018-07-23 DIAGNOSIS — I9789 Other postprocedural complications and disorders of the circulatory system, not elsewhere classified: Secondary | ICD-10-CM | POA: Diagnosis not present

## 2018-07-23 DIAGNOSIS — E785 Hyperlipidemia, unspecified: Secondary | ICD-10-CM | POA: Diagnosis not present

## 2018-07-23 DIAGNOSIS — I2510 Atherosclerotic heart disease of native coronary artery without angina pectoris: Secondary | ICD-10-CM | POA: Diagnosis not present

## 2018-07-23 DIAGNOSIS — I10 Essential (primary) hypertension: Secondary | ICD-10-CM | POA: Diagnosis not present

## 2018-07-23 DIAGNOSIS — I4891 Unspecified atrial fibrillation: Secondary | ICD-10-CM | POA: Diagnosis not present

## 2018-08-06 DIAGNOSIS — Z23 Encounter for immunization: Secondary | ICD-10-CM | POA: Diagnosis not present

## 2018-08-06 DIAGNOSIS — I471 Supraventricular tachycardia: Secondary | ICD-10-CM | POA: Diagnosis not present

## 2018-08-13 DIAGNOSIS — I361 Nonrheumatic tricuspid (valve) insufficiency: Secondary | ICD-10-CM | POA: Diagnosis not present

## 2018-08-13 DIAGNOSIS — I340 Nonrheumatic mitral (valve) insufficiency: Secondary | ICD-10-CM | POA: Diagnosis not present

## 2018-08-13 DIAGNOSIS — I471 Supraventricular tachycardia: Secondary | ICD-10-CM | POA: Diagnosis not present

## 2018-09-21 DIAGNOSIS — R600 Localized edema: Secondary | ICD-10-CM | POA: Diagnosis not present

## 2018-09-21 DIAGNOSIS — I471 Supraventricular tachycardia: Secondary | ICD-10-CM | POA: Diagnosis not present

## 2018-09-21 DIAGNOSIS — I10 Essential (primary) hypertension: Secondary | ICD-10-CM | POA: Diagnosis not present

## 2018-09-21 DIAGNOSIS — I4891 Unspecified atrial fibrillation: Secondary | ICD-10-CM | POA: Diagnosis not present

## 2018-09-21 DIAGNOSIS — E785 Hyperlipidemia, unspecified: Secondary | ICD-10-CM | POA: Diagnosis not present

## 2018-09-21 DIAGNOSIS — I2510 Atherosclerotic heart disease of native coronary artery without angina pectoris: Secondary | ICD-10-CM | POA: Diagnosis not present

## 2018-09-21 DIAGNOSIS — I493 Ventricular premature depolarization: Secondary | ICD-10-CM | POA: Diagnosis not present

## 2018-09-21 DIAGNOSIS — I9789 Other postprocedural complications and disorders of the circulatory system, not elsewhere classified: Secondary | ICD-10-CM | POA: Diagnosis not present

## 2018-09-23 DIAGNOSIS — K2950 Unspecified chronic gastritis without bleeding: Secondary | ICD-10-CM | POA: Diagnosis not present

## 2018-09-23 DIAGNOSIS — Z6837 Body mass index (BMI) 37.0-37.9, adult: Secondary | ICD-10-CM | POA: Diagnosis not present

## 2018-09-23 DIAGNOSIS — G4730 Sleep apnea, unspecified: Secondary | ICD-10-CM | POA: Diagnosis not present

## 2018-09-23 DIAGNOSIS — Z794 Long term (current) use of insulin: Secondary | ICD-10-CM | POA: Diagnosis not present

## 2018-09-23 DIAGNOSIS — E669 Obesity, unspecified: Secondary | ICD-10-CM | POA: Diagnosis not present

## 2018-09-23 DIAGNOSIS — Z7982 Long term (current) use of aspirin: Secondary | ICD-10-CM | POA: Diagnosis not present

## 2018-09-23 DIAGNOSIS — I10 Essential (primary) hypertension: Secondary | ICD-10-CM | POA: Diagnosis not present

## 2018-09-23 DIAGNOSIS — R1310 Dysphagia, unspecified: Secondary | ICD-10-CM | POA: Diagnosis not present

## 2018-09-23 DIAGNOSIS — E119 Type 2 diabetes mellitus without complications: Secondary | ICD-10-CM | POA: Diagnosis not present

## 2018-09-23 DIAGNOSIS — K3189 Other diseases of stomach and duodenum: Secondary | ICD-10-CM | POA: Diagnosis not present

## 2018-09-23 DIAGNOSIS — J449 Chronic obstructive pulmonary disease, unspecified: Secondary | ICD-10-CM | POA: Diagnosis not present

## 2018-09-23 DIAGNOSIS — Z79899 Other long term (current) drug therapy: Secondary | ICD-10-CM | POA: Diagnosis not present

## 2018-09-23 DIAGNOSIS — Z8673 Personal history of transient ischemic attack (TIA), and cerebral infarction without residual deficits: Secondary | ICD-10-CM | POA: Diagnosis not present

## 2018-09-23 DIAGNOSIS — I2510 Atherosclerotic heart disease of native coronary artery without angina pectoris: Secondary | ICD-10-CM | POA: Diagnosis not present

## 2018-09-23 DIAGNOSIS — K2970 Gastritis, unspecified, without bleeding: Secondary | ICD-10-CM | POA: Diagnosis not present

## 2018-09-24 DIAGNOSIS — E538 Deficiency of other specified B group vitamins: Secondary | ICD-10-CM | POA: Diagnosis not present

## 2018-09-24 DIAGNOSIS — E1165 Type 2 diabetes mellitus with hyperglycemia: Secondary | ICD-10-CM | POA: Diagnosis not present

## 2018-09-24 DIAGNOSIS — E782 Mixed hyperlipidemia: Secondary | ICD-10-CM | POA: Diagnosis not present

## 2018-09-28 DIAGNOSIS — E1165 Type 2 diabetes mellitus with hyperglycemia: Secondary | ICD-10-CM | POA: Diagnosis not present

## 2018-10-12 DIAGNOSIS — R21 Rash and other nonspecific skin eruption: Secondary | ICD-10-CM | POA: Diagnosis not present

## 2018-10-12 DIAGNOSIS — R1310 Dysphagia, unspecified: Secondary | ICD-10-CM | POA: Diagnosis not present

## 2018-10-22 ENCOUNTER — Ambulatory Visit: Payer: Medicare Other

## 2018-11-08 DIAGNOSIS — R238 Other skin changes: Secondary | ICD-10-CM | POA: Diagnosis not present

## 2018-11-08 DIAGNOSIS — L538 Other specified erythematous conditions: Secondary | ICD-10-CM | POA: Diagnosis not present

## 2018-11-08 DIAGNOSIS — B078 Other viral warts: Secondary | ICD-10-CM | POA: Diagnosis not present

## 2018-11-08 DIAGNOSIS — L0292 Furuncle, unspecified: Secondary | ICD-10-CM | POA: Diagnosis not present

## 2018-11-08 DIAGNOSIS — Z789 Other specified health status: Secondary | ICD-10-CM | POA: Diagnosis not present

## 2018-11-08 DIAGNOSIS — L309 Dermatitis, unspecified: Secondary | ICD-10-CM | POA: Diagnosis not present

## 2018-12-21 DIAGNOSIS — E119 Type 2 diabetes mellitus without complications: Secondary | ICD-10-CM | POA: Diagnosis not present

## 2018-12-21 DIAGNOSIS — E782 Mixed hyperlipidemia: Secondary | ICD-10-CM | POA: Diagnosis not present

## 2019-02-23 DIAGNOSIS — M4316 Spondylolisthesis, lumbar region: Secondary | ICD-10-CM | POA: Diagnosis not present

## 2019-02-23 DIAGNOSIS — M5416 Radiculopathy, lumbar region: Secondary | ICD-10-CM | POA: Diagnosis not present

## 2019-02-23 DIAGNOSIS — M5136 Other intervertebral disc degeneration, lumbar region: Secondary | ICD-10-CM | POA: Diagnosis not present

## 2019-02-23 DIAGNOSIS — M21371 Foot drop, right foot: Secondary | ICD-10-CM | POA: Diagnosis not present

## 2019-02-23 DIAGNOSIS — M48062 Spinal stenosis, lumbar region with neurogenic claudication: Secondary | ICD-10-CM | POA: Diagnosis not present

## 2019-02-23 DIAGNOSIS — M47816 Spondylosis without myelopathy or radiculopathy, lumbar region: Secondary | ICD-10-CM | POA: Diagnosis not present

## 2019-04-08 ENCOUNTER — Other Ambulatory Visit: Payer: Self-pay | Admitting: Acute Care

## 2019-04-08 DIAGNOSIS — Z87891 Personal history of nicotine dependence: Secondary | ICD-10-CM

## 2019-04-08 DIAGNOSIS — Z122 Encounter for screening for malignant neoplasm of respiratory organs: Secondary | ICD-10-CM

## 2019-04-08 DIAGNOSIS — F17210 Nicotine dependence, cigarettes, uncomplicated: Secondary | ICD-10-CM

## 2019-05-23 ENCOUNTER — Ambulatory Visit: Payer: Medicare Other

## 2019-07-07 DIAGNOSIS — I872 Venous insufficiency (chronic) (peripheral): Secondary | ICD-10-CM | POA: Diagnosis not present

## 2019-07-07 DIAGNOSIS — I83019 Varicose veins of right lower extremity with ulcer of unspecified site: Secondary | ICD-10-CM | POA: Diagnosis not present

## 2019-07-07 DIAGNOSIS — I890 Lymphedema, not elsewhere classified: Secondary | ICD-10-CM | POA: Diagnosis not present

## 2019-07-07 DIAGNOSIS — I83029 Varicose veins of left lower extremity with ulcer of unspecified site: Secondary | ICD-10-CM | POA: Diagnosis not present

## 2019-07-15 DIAGNOSIS — I83029 Varicose veins of left lower extremity with ulcer of unspecified site: Secondary | ICD-10-CM | POA: Diagnosis not present

## 2019-07-15 DIAGNOSIS — I890 Lymphedema, not elsewhere classified: Secondary | ICD-10-CM | POA: Diagnosis not present

## 2019-07-15 DIAGNOSIS — I83019 Varicose veins of right lower extremity with ulcer of unspecified site: Secondary | ICD-10-CM | POA: Diagnosis not present

## 2019-07-15 DIAGNOSIS — I872 Venous insufficiency (chronic) (peripheral): Secondary | ICD-10-CM | POA: Diagnosis not present

## 2019-08-07 DIAGNOSIS — Z1159 Encounter for screening for other viral diseases: Secondary | ICD-10-CM | POA: Diagnosis not present

## 2019-08-07 DIAGNOSIS — R5383 Other fatigue: Secondary | ICD-10-CM | POA: Diagnosis not present

## 2019-08-07 DIAGNOSIS — Z20828 Contact with and (suspected) exposure to other viral communicable diseases: Secondary | ICD-10-CM | POA: Diagnosis not present
